# Patient Record
Sex: Female | Born: 1935
Health system: Southern US, Community
[De-identification: ages and names within clinical notes are randomized; demographics above are authoritative.]

## PROBLEM LIST (undated history)

## (undated) DIAGNOSIS — K635 Polyp of colon: Secondary | ICD-10-CM

## (undated) DIAGNOSIS — R001 Bradycardia, unspecified: Secondary | ICD-10-CM

## (undated) DIAGNOSIS — E119 Type 2 diabetes mellitus without complications: Secondary | ICD-10-CM

## (undated) DIAGNOSIS — J986 Disorders of diaphragm: Secondary | ICD-10-CM

## (undated) DIAGNOSIS — I10 Essential (primary) hypertension: Secondary | ICD-10-CM

## (undated) DIAGNOSIS — F0390 Unspecified dementia without behavioral disturbance: Secondary | ICD-10-CM

## (undated) DIAGNOSIS — E785 Hyperlipidemia, unspecified: Secondary | ICD-10-CM

## (undated) DIAGNOSIS — M47812 Spondylosis without myelopathy or radiculopathy, cervical region: Secondary | ICD-10-CM

## (undated) DIAGNOSIS — I779 Disorder of arteries and arterioles, unspecified: Secondary | ICD-10-CM

## (undated) DIAGNOSIS — I2510 Atherosclerotic heart disease of native coronary artery without angina pectoris: Secondary | ICD-10-CM

## (undated) DIAGNOSIS — I739 Peripheral vascular disease, unspecified: Secondary | ICD-10-CM

## (undated) HISTORY — DX: Polyp of colon: K63.5

## (undated) HISTORY — DX: Disorders of diaphragm: J98.6

## (undated) HISTORY — PX: CATARACT EXTRACTION: SUR2

## (undated) HISTORY — PX: CHOLECYSTECTOMY: SHX55

## (undated) HISTORY — PX: CORONARY ARTERY BYPASS GRAFT: SHX141

## (undated) HISTORY — PX: OTHER SURGICAL HISTORY: SHX169

## (undated) HISTORY — DX: Essential (primary) hypertension: I10

## (undated) HISTORY — PX: COLONOSCOPY W/ POLYPECTOMY: SHX1380

## (undated) HISTORY — PX: BREAST LUMPECTOMY: SHX2

## (undated) HISTORY — DX: Atherosclerotic heart disease of native coronary artery without angina pectoris: I25.10

## (undated) HISTORY — DX: Peripheral vascular disease, unspecified: I73.9

## (undated) HISTORY — DX: Disorder of arteries and arterioles, unspecified: I77.9

## (undated) HISTORY — PX: HIATAL HERNIA REPAIR: SHX195

## (undated) HISTORY — DX: Hyperlipidemia, unspecified: E78.5

## (undated) HISTORY — DX: Bradycardia, unspecified: R00.1

## (undated) HISTORY — PX: TOTAL ABDOMINAL HYSTERECTOMY: SHX209

## (undated) HISTORY — DX: Spondylosis without myelopathy or radiculopathy, cervical region: M47.812

## (undated) HISTORY — DX: Type 2 diabetes mellitus without complications: E11.9

---

## 1997-12-10 ENCOUNTER — Other Ambulatory Visit: Admission: RE | Admit: 1997-12-10 | Discharge: 1997-12-10 | Payer: Self-pay | Admitting: Obstetrics and Gynecology

## 1999-04-07 ENCOUNTER — Ambulatory Visit (HOSPITAL_COMMUNITY): Admission: RE | Admit: 1999-04-07 | Discharge: 1999-04-07 | Payer: Self-pay | Admitting: Gastroenterology

## 1999-04-07 ENCOUNTER — Encounter: Payer: Self-pay | Admitting: Gastroenterology

## 2001-12-18 ENCOUNTER — Encounter: Admission: RE | Admit: 2001-12-18 | Discharge: 2001-12-18 | Payer: Self-pay | Admitting: Family Medicine

## 2001-12-18 ENCOUNTER — Encounter: Payer: Self-pay | Admitting: Family Medicine

## 2002-04-17 ENCOUNTER — Encounter: Admission: RE | Admit: 2002-04-17 | Discharge: 2002-04-17 | Payer: Self-pay | Admitting: Family Medicine

## 2002-04-17 ENCOUNTER — Encounter: Payer: Self-pay | Admitting: Family Medicine

## 2002-04-24 ENCOUNTER — Encounter: Payer: Self-pay | Admitting: General Surgery

## 2002-04-29 ENCOUNTER — Observation Stay (HOSPITAL_COMMUNITY): Admission: RE | Admit: 2002-04-29 | Discharge: 2002-04-30 | Payer: Self-pay | Admitting: General Surgery

## 2002-04-29 ENCOUNTER — Encounter: Payer: Self-pay | Admitting: General Surgery

## 2002-04-29 ENCOUNTER — Encounter (INDEPENDENT_AMBULATORY_CARE_PROVIDER_SITE_OTHER): Payer: Self-pay | Admitting: Specialist

## 2002-09-05 ENCOUNTER — Encounter: Payer: Self-pay | Admitting: Thoracic Surgery (Cardiothoracic Vascular Surgery)

## 2002-09-05 ENCOUNTER — Encounter: Payer: Self-pay | Admitting: Emergency Medicine

## 2002-09-05 ENCOUNTER — Inpatient Hospital Stay (HOSPITAL_COMMUNITY): Admission: EM | Admit: 2002-09-05 | Discharge: 2002-09-09 | Payer: Self-pay | Admitting: Emergency Medicine

## 2002-09-06 ENCOUNTER — Encounter: Payer: Self-pay | Admitting: Thoracic Surgery (Cardiothoracic Vascular Surgery)

## 2002-09-07 ENCOUNTER — Encounter: Payer: Self-pay | Admitting: Thoracic Surgery (Cardiothoracic Vascular Surgery)

## 2002-09-08 ENCOUNTER — Encounter: Payer: Self-pay | Admitting: Thoracic Surgery (Cardiothoracic Vascular Surgery)

## 2002-09-29 ENCOUNTER — Encounter: Admission: RE | Admit: 2002-09-29 | Discharge: 2002-12-28 | Payer: Self-pay | Admitting: Cardiology

## 2002-11-24 ENCOUNTER — Encounter: Payer: Self-pay | Admitting: Thoracic Surgery (Cardiothoracic Vascular Surgery)

## 2002-11-24 ENCOUNTER — Encounter
Admission: RE | Admit: 2002-11-24 | Discharge: 2002-11-24 | Payer: Self-pay | Admitting: Thoracic Surgery (Cardiothoracic Vascular Surgery)

## 2002-12-29 ENCOUNTER — Encounter (HOSPITAL_COMMUNITY): Admission: RE | Admit: 2002-12-29 | Discharge: 2003-03-29 | Payer: Self-pay | Admitting: Cardiology

## 2004-07-25 ENCOUNTER — Ambulatory Visit: Payer: Self-pay | Admitting: Gastroenterology

## 2004-08-11 ENCOUNTER — Ambulatory Visit: Payer: Self-pay | Admitting: Gastroenterology

## 2004-08-15 ENCOUNTER — Ambulatory Visit: Payer: Self-pay | Admitting: Family Medicine

## 2004-08-24 ENCOUNTER — Ambulatory Visit: Payer: Self-pay | Admitting: Family Medicine

## 2004-09-19 ENCOUNTER — Ambulatory Visit: Payer: Self-pay | Admitting: Family Medicine

## 2004-10-11 ENCOUNTER — Ambulatory Visit: Payer: Self-pay | Admitting: Family Medicine

## 2004-12-12 ENCOUNTER — Ambulatory Visit: Payer: Self-pay | Admitting: Family Medicine

## 2004-12-13 ENCOUNTER — Encounter: Admission: RE | Admit: 2004-12-13 | Discharge: 2004-12-13 | Payer: Self-pay | Admitting: Family Medicine

## 2004-12-20 ENCOUNTER — Ambulatory Visit: Payer: Self-pay | Admitting: Family Medicine

## 2005-02-06 ENCOUNTER — Ambulatory Visit: Payer: Self-pay | Admitting: Cardiology

## 2005-03-27 ENCOUNTER — Encounter: Admission: RE | Admit: 2005-03-27 | Discharge: 2005-03-27 | Payer: Self-pay | Admitting: Family Medicine

## 2005-03-28 ENCOUNTER — Ambulatory Visit: Payer: Self-pay | Admitting: Family Medicine

## 2005-03-29 ENCOUNTER — Encounter: Admission: RE | Admit: 2005-03-29 | Discharge: 2005-03-29 | Payer: Self-pay | Admitting: Family Medicine

## 2005-04-06 ENCOUNTER — Ambulatory Visit: Payer: Self-pay | Admitting: Family Medicine

## 2005-04-13 ENCOUNTER — Ambulatory Visit: Payer: Self-pay | Admitting: Family Medicine

## 2005-09-23 ENCOUNTER — Emergency Department (HOSPITAL_COMMUNITY): Admission: EM | Admit: 2005-09-23 | Discharge: 2005-09-23 | Payer: Self-pay | Admitting: Emergency Medicine

## 2006-03-13 ENCOUNTER — Ambulatory Visit: Payer: Self-pay | Admitting: Cardiology

## 2006-04-16 ENCOUNTER — Ambulatory Visit: Payer: Self-pay | Admitting: Family Medicine

## 2006-04-23 ENCOUNTER — Encounter: Admission: RE | Admit: 2006-04-23 | Discharge: 2006-04-23 | Payer: Self-pay | Admitting: Family Medicine

## 2006-06-14 ENCOUNTER — Ambulatory Visit: Payer: Self-pay | Admitting: Family Medicine

## 2006-06-26 ENCOUNTER — Encounter: Admission: RE | Admit: 2006-06-26 | Discharge: 2006-06-26 | Payer: Self-pay | Admitting: Family Medicine

## 2006-08-20 ENCOUNTER — Ambulatory Visit: Payer: Self-pay | Admitting: Family Medicine

## 2006-10-02 ENCOUNTER — Ambulatory Visit: Payer: Self-pay | Admitting: Family Medicine

## 2006-10-10 ENCOUNTER — Ambulatory Visit: Payer: Self-pay | Admitting: Family Medicine

## 2006-10-10 LAB — CONVERTED CEMR LAB: Glucose, Bld: 77 mg/dL (ref 70–99)

## 2007-02-25 ENCOUNTER — Encounter: Payer: Self-pay | Admitting: Family Medicine

## 2007-03-14 DIAGNOSIS — J309 Allergic rhinitis, unspecified: Secondary | ICD-10-CM | POA: Insufficient documentation

## 2007-03-14 DIAGNOSIS — M810 Age-related osteoporosis without current pathological fracture: Secondary | ICD-10-CM

## 2007-03-18 ENCOUNTER — Ambulatory Visit: Payer: Self-pay | Admitting: Cardiology

## 2007-04-18 ENCOUNTER — Ambulatory Visit: Payer: Self-pay | Admitting: Family Medicine

## 2007-04-18 DIAGNOSIS — I119 Hypertensive heart disease without heart failure: Secondary | ICD-10-CM | POA: Insufficient documentation

## 2007-04-18 LAB — CONVERTED CEMR LAB
ALT: 16 units/L (ref 0–35)
AST: 22 units/L (ref 0–37)
Alkaline Phosphatase: 91 units/L (ref 39–117)
Basophils Absolute: 0 10*3/uL (ref 0.0–0.1)
Creatinine, Ser: 0.9 mg/dL (ref 0.4–1.2)
Eosinophils Absolute: 0.3 10*3/uL (ref 0.0–0.6)
GFR calc Af Amer: 78 mL/min
Glucose, Bld: 103 mg/dL — ABNORMAL HIGH (ref 70–99)
HCT: 38.4 % (ref 36.0–46.0)
Hemoglobin: 13.3 g/dL (ref 12.0–15.0)
MCHC: 34.6 g/dL (ref 30.0–36.0)
Monocytes Relative: 7.1 % (ref 3.0–11.0)
Platelets: 183 10*3/uL (ref 150–400)
Potassium: 3.9 meq/L (ref 3.5–5.1)
RBC: 4.35 M/uL (ref 3.87–5.11)
Total Bilirubin: 0.7 mg/dL (ref 0.3–1.2)
Total CHOL/HDL Ratio: 3.6
Total Protein: 6.3 g/dL (ref 6.0–8.3)
Triglycerides: 204 mg/dL (ref 0–149)
VLDL: 41 mg/dL — ABNORMAL HIGH (ref 0–40)
WBC: 5.6 10*3/uL (ref 4.5–10.5)

## 2007-05-03 ENCOUNTER — Telehealth: Payer: Self-pay | Admitting: Family Medicine

## 2007-06-05 ENCOUNTER — Ambulatory Visit: Payer: Self-pay | Admitting: Family Medicine

## 2007-06-06 ENCOUNTER — Telehealth: Payer: Self-pay | Admitting: *Deleted

## 2007-07-12 ENCOUNTER — Telehealth: Payer: Self-pay | Admitting: Family Medicine

## 2007-07-22 ENCOUNTER — Encounter: Payer: Self-pay | Admitting: Family Medicine

## 2007-08-02 ENCOUNTER — Telehealth (INDEPENDENT_AMBULATORY_CARE_PROVIDER_SITE_OTHER): Payer: Self-pay | Admitting: *Deleted

## 2007-08-06 ENCOUNTER — Telehealth: Payer: Self-pay | Admitting: Family Medicine

## 2007-08-15 ENCOUNTER — Telehealth: Payer: Self-pay | Admitting: Family Medicine

## 2007-10-17 ENCOUNTER — Ambulatory Visit: Payer: Self-pay | Admitting: Family Medicine

## 2007-10-29 ENCOUNTER — Ambulatory Visit: Payer: Self-pay | Admitting: Family Medicine

## 2007-11-06 ENCOUNTER — Ambulatory Visit: Payer: Self-pay | Admitting: Cardiology

## 2007-11-06 ENCOUNTER — Inpatient Hospital Stay (HOSPITAL_COMMUNITY): Admission: EM | Admit: 2007-11-06 | Discharge: 2007-11-08 | Payer: Self-pay | Admitting: Emergency Medicine

## 2007-11-18 ENCOUNTER — Ambulatory Visit: Payer: Self-pay | Admitting: Cardiology

## 2007-11-21 ENCOUNTER — Ambulatory Visit: Payer: Self-pay | Admitting: Family Medicine

## 2007-12-20 ENCOUNTER — Telehealth: Payer: Self-pay | Admitting: *Deleted

## 2007-12-21 ENCOUNTER — Ambulatory Visit: Payer: Self-pay | Admitting: Internal Medicine

## 2007-12-21 ENCOUNTER — Telehealth: Payer: Self-pay | Admitting: Internal Medicine

## 2007-12-23 ENCOUNTER — Ambulatory Visit: Payer: Self-pay | Admitting: Family Medicine

## 2008-01-14 ENCOUNTER — Ambulatory Visit: Payer: Self-pay | Admitting: Family Medicine

## 2008-01-14 LAB — CONVERTED CEMR LAB
AST: 19 units/L (ref 0–37)
Alkaline Phosphatase: 75 units/L (ref 39–117)
Bilirubin, Direct: 0.2 mg/dL (ref 0.0–0.3)
HDL: 39.2 mg/dL (ref >39.0)
LDL Cholesterol: 68 mg/dL (ref 0–99)
Total CHOL/HDL Ratio: 3.3
Triglycerides: 117 mg/dL (ref 0–149)

## 2008-04-29 ENCOUNTER — Ambulatory Visit: Payer: Self-pay | Admitting: Family Medicine

## 2008-04-29 LAB — CONVERTED CEMR LAB
AST: 23 units/L (ref 0–37)
Albumin: 3.7 g/dL (ref 3.5–5.2)
BUN: 14 mg/dL (ref 6–23)
Basophils Absolute: 0 10*3/uL (ref 0.0–0.1)
Bilirubin, Direct: 0.2 mg/dL (ref 0.0–0.3)
Calcium: 9.2 mg/dL (ref 8.4–10.5)
Creatinine, Ser: 1 mg/dL (ref 0.4–1.2)
GFR calc Af Amer: 69 mL/min
GFR calc non Af Amer: 57 mL/min
Glucose, Bld: 94 mg/dL (ref 70–99)
Glucose, Urine, Semiquant: NEGATIVE
HCT: 38.4 % (ref 36.0–46.0)
Hemoglobin: 13 g/dL (ref 12.0–15.0)
Ketones, urine, test strip: NEGATIVE
LDL Cholesterol: 82 mg/dL (ref 0–99)
MCHC: 33.9 g/dL (ref 30.0–36.0)
Neutro Abs: 2.6 10*3/uL (ref 1.4–7.7)
Nitrite: NEGATIVE
Platelets: 179 10*3/uL (ref 150–400)
Protein, U semiquant: NEGATIVE
RDW: 13.8 % (ref 11.5–14.6)
Sodium: 145 meq/L (ref 135–145)
Total CHOL/HDL Ratio: 2.8
Total Protein: 6.7 g/dL (ref 6.0–8.3)
VLDL: 18 mg/dL (ref 0–40)
WBC: 4 10*3/uL — ABNORMAL LOW (ref 4.5–10.5)

## 2008-05-19 ENCOUNTER — Ambulatory Visit: Payer: Self-pay | Admitting: Cardiology

## 2008-06-15 ENCOUNTER — Telehealth: Payer: Self-pay | Admitting: Family Medicine

## 2008-07-14 ENCOUNTER — Ambulatory Visit: Payer: Self-pay | Admitting: Family Medicine

## 2008-07-14 ENCOUNTER — Telehealth: Payer: Self-pay | Admitting: *Deleted

## 2008-07-22 ENCOUNTER — Encounter: Admission: RE | Admit: 2008-07-22 | Discharge: 2008-07-22 | Payer: Self-pay | Admitting: Family Medicine

## 2008-10-05 ENCOUNTER — Ambulatory Visit: Payer: Self-pay | Admitting: Family Medicine

## 2008-10-05 DIAGNOSIS — M479 Spondylosis, unspecified: Secondary | ICD-10-CM | POA: Insufficient documentation

## 2008-10-05 DIAGNOSIS — L723 Sebaceous cyst: Secondary | ICD-10-CM | POA: Insufficient documentation

## 2008-11-13 DIAGNOSIS — D126 Benign neoplasm of colon, unspecified: Secondary | ICD-10-CM

## 2008-11-17 ENCOUNTER — Encounter: Payer: Self-pay | Admitting: Cardiology

## 2008-11-17 ENCOUNTER — Ambulatory Visit: Payer: Self-pay | Admitting: Cardiology

## 2008-12-22 ENCOUNTER — Telehealth: Payer: Self-pay | Admitting: Cardiology

## 2009-05-03 ENCOUNTER — Ambulatory Visit: Payer: Self-pay | Admitting: Family Medicine

## 2009-05-03 LAB — CONVERTED CEMR LAB
ALT: 14 units/L (ref 0–35)
Alkaline Phosphatase: 76 units/L (ref 39–117)
Basophils Absolute: 0 10*3/uL (ref 0.0–0.1)
Calcium: 9.4 mg/dL (ref 8.4–10.5)
Creatinine, Ser: 0.9 mg/dL (ref 0.4–1.2)
Eosinophils Relative: 4.7 % (ref 0.0–5.0)
GFR calc non Af Amer: 64.34 mL/min (ref >60)
Glucose, Bld: 86 mg/dL (ref 70–99)
Hemoglobin: 13.5 g/dL (ref 12.0–15.0)
Lymphocytes Relative: 20.2 % (ref 12.0–46.0)
Lymphs Abs: 1 10*3/uL (ref 0.7–4.0)
MCV: 90.8 fL (ref 78.0–100.0)
Monocytes Relative: 6.5 % (ref 3.0–12.0)
Neutrophils Relative %: 68 % (ref 43.0–77.0)
Total Bilirubin: 0.8 mg/dL (ref 0.3–1.2)
Total CHOL/HDL Ratio: 2
WBC: 4.9 10*3/uL (ref 4.5–10.5)

## 2009-05-13 ENCOUNTER — Encounter: Payer: Self-pay | Admitting: Cardiology

## 2009-05-17 ENCOUNTER — Ambulatory Visit: Payer: Self-pay | Admitting: Family Medicine

## 2009-05-17 ENCOUNTER — Ambulatory Visit: Payer: Self-pay | Admitting: Cardiology

## 2009-06-21 ENCOUNTER — Telehealth: Payer: Self-pay | Admitting: Family Medicine

## 2009-07-23 ENCOUNTER — Encounter: Admission: RE | Admit: 2009-07-23 | Discharge: 2009-07-23 | Payer: Self-pay | Admitting: Family Medicine

## 2009-07-29 ENCOUNTER — Encounter: Payer: Self-pay | Admitting: Family Medicine

## 2009-08-02 ENCOUNTER — Encounter: Admission: RE | Admit: 2009-08-02 | Discharge: 2009-08-02 | Payer: Self-pay | Admitting: Family Medicine

## 2009-08-02 ENCOUNTER — Telehealth: Payer: Self-pay | Admitting: Internal Medicine

## 2009-08-03 ENCOUNTER — Telehealth: Payer: Self-pay | Admitting: Family Medicine

## 2009-09-27 ENCOUNTER — Ambulatory Visit: Payer: Self-pay | Admitting: Family Medicine

## 2009-10-28 ENCOUNTER — Telehealth: Payer: Self-pay | Admitting: Family Medicine

## 2009-11-11 ENCOUNTER — Encounter: Payer: Self-pay | Admitting: Cardiology

## 2009-11-12 ENCOUNTER — Ambulatory Visit: Payer: Self-pay | Admitting: Cardiology

## 2010-05-10 ENCOUNTER — Encounter: Payer: Self-pay | Admitting: Cardiology

## 2010-05-12 ENCOUNTER — Ambulatory Visit: Payer: Self-pay | Admitting: Cardiology

## 2010-06-13 ENCOUNTER — Encounter: Payer: Self-pay | Admitting: Cardiology

## 2010-06-13 ENCOUNTER — Ambulatory Visit: Payer: Self-pay

## 2010-06-13 ENCOUNTER — Ambulatory Visit: Payer: Self-pay | Admitting: Family Medicine

## 2010-06-13 DIAGNOSIS — F329 Major depressive disorder, single episode, unspecified: Secondary | ICD-10-CM

## 2010-07-11 ENCOUNTER — Ambulatory Visit: Payer: Self-pay | Admitting: Family Medicine

## 2010-08-10 ENCOUNTER — Telehealth (INDEPENDENT_AMBULATORY_CARE_PROVIDER_SITE_OTHER): Payer: Self-pay | Admitting: *Deleted

## 2010-08-10 ENCOUNTER — Ambulatory Visit
Admission: RE | Admit: 2010-08-10 | Discharge: 2010-08-10 | Payer: Self-pay | Source: Home / Self Care | Attending: Family Medicine | Admitting: Family Medicine

## 2010-08-10 ENCOUNTER — Other Ambulatory Visit: Payer: Self-pay | Admitting: Family Medicine

## 2010-08-10 ENCOUNTER — Encounter: Payer: Self-pay | Admitting: Family Medicine

## 2010-08-10 ENCOUNTER — Telehealth: Payer: Self-pay | Admitting: Family Medicine

## 2010-08-10 DIAGNOSIS — R269 Unspecified abnormalities of gait and mobility: Secondary | ICD-10-CM | POA: Insufficient documentation

## 2010-08-10 DIAGNOSIS — R1310 Dysphagia, unspecified: Secondary | ICD-10-CM | POA: Insufficient documentation

## 2010-08-10 DIAGNOSIS — D485 Neoplasm of uncertain behavior of skin: Secondary | ICD-10-CM | POA: Insufficient documentation

## 2010-08-10 LAB — LIPID PANEL
Cholesterol: 140 mg/dL (ref 0–200)
HDL: 55.7 mg/dL (ref >39.00)
LDL Cholesterol: 69 mg/dL (ref 0–99)
Total CHOL/HDL Ratio: 3
Triglycerides: 76 mg/dL (ref 0.0–149.0)
VLDL: 15.2 mg/dL (ref 0.0–40.0)

## 2010-08-10 LAB — CBC WITH DIFFERENTIAL/PLATELET
Basophils Absolute: 0 10*3/uL (ref 0.0–0.1)
Basophils Relative: 1 % (ref 0.0–3.0)
Eosinophils Absolute: 0.2 10*3/uL (ref 0.0–0.7)
Eosinophils Relative: 6.9 % — ABNORMAL HIGH (ref 0.0–5.0)
HCT: 38.3 % (ref 36.0–46.0)
Hemoglobin: 12.8 g/dL (ref 12.0–15.0)
Lymphocytes Relative: 26.9 % (ref 12.0–46.0)
Lymphs Abs: 1 10*3/uL (ref 0.7–4.0)
MCHC: 33.3 g/dL (ref 30.0–36.0)
MCV: 91.8 fl (ref 78.0–100.0)
Monocytes Absolute: 0.3 10*3/uL (ref 0.1–1.0)
Monocytes Relative: 7.9 % (ref 3.0–12.0)
Neutro Abs: 2.1 10*3/uL (ref 1.4–7.7)
Neutrophils Relative %: 57.3 % (ref 43.0–77.0)
Platelets: 166 10*3/uL (ref 150.0–400.0)
RBC: 4.18 Mil/uL (ref 3.87–5.11)
RDW: 13 % (ref 11.5–14.6)
WBC: 3.6 10*3/uL — ABNORMAL LOW (ref 4.5–10.5)

## 2010-08-10 LAB — HEPATIC FUNCTION PANEL
ALT: 15 U/L (ref 0–35)
AST: 22 U/L (ref 0–37)
Albumin: 3.7 g/dL (ref 3.5–5.2)
Alkaline Phosphatase: 60 U/L (ref 39–117)
Bilirubin, Direct: 0.1 mg/dL (ref 0.0–0.3)
Total Bilirubin: 1 mg/dL (ref 0.3–1.2)
Total Protein: 6.4 g/dL (ref 6.0–8.3)

## 2010-08-10 LAB — BASIC METABOLIC PANEL
BUN: 11 mg/dL (ref 6–23)
CO2: 32 mEq/L (ref 19–32)
Calcium: 9.3 mg/dL (ref 8.4–10.5)
Chloride: 103 mEq/L (ref 96–112)
Creatinine, Ser: 0.9 mg/dL (ref 0.4–1.2)
GFR: 66.68 mL/min (ref >60.00)
Glucose, Bld: 82 mg/dL (ref 70–99)
Potassium: 3.6 mEq/L (ref 3.5–5.1)
Sodium: 141 mEq/L (ref 135–145)

## 2010-08-10 LAB — TSH: TSH: 1.72 u[IU]/mL (ref 0.35–5.50)

## 2010-08-11 ENCOUNTER — Encounter
Admission: RE | Admit: 2010-08-11 | Discharge: 2010-08-11 | Payer: Self-pay | Source: Home / Self Care | Attending: Family Medicine | Admitting: Family Medicine

## 2010-08-11 LAB — CONVERTED CEMR LAB: Vit D, 25-Hydroxy: 33 ng/mL (ref 30–89)

## 2010-08-12 ENCOUNTER — Telehealth: Payer: Self-pay | Admitting: Family Medicine

## 2010-08-17 ENCOUNTER — Ambulatory Visit
Admission: RE | Admit: 2010-08-17 | Discharge: 2010-08-17 | Payer: Self-pay | Source: Home / Self Care | Attending: Family Medicine | Admitting: Family Medicine

## 2010-08-17 DIAGNOSIS — K1370 Unspecified lesions of oral mucosa: Secondary | ICD-10-CM | POA: Insufficient documentation

## 2010-08-23 ENCOUNTER — Ambulatory Visit
Admission: RE | Admit: 2010-08-23 | Discharge: 2010-08-23 | Payer: Self-pay | Source: Home / Self Care | Attending: Cardiology | Admitting: Cardiology

## 2010-08-24 ENCOUNTER — Telehealth (INDEPENDENT_AMBULATORY_CARE_PROVIDER_SITE_OTHER): Payer: Self-pay | Admitting: *Deleted

## 2010-08-28 ENCOUNTER — Encounter: Payer: Self-pay | Admitting: Family Medicine

## 2010-09-08 NOTE — Assessment & Plan Note (Signed)
Summary: cpx /lab/cbs   Vital Signs:  Patient profile:   77 year old female Menstrual status:  hysterectomy Height:      62 inches Weight:      132 pounds BMI:     24.23 Pulse rate:   63 / minute BP sitting:   126 / 72  (left arm)  Vitals Entered By: Doristine Devoid CMA (August 10, 2010 8:36 AM) CC: YEARLY AND LABS   History of Present Illness: 77 yo woman here today for CPE.    Here for Medicare AWV:  1.   Risk factors based on Past M, S, F history: hyperlipidemia- on lipitor.  due for labs.  denies abd pain, N/V, myalgias depression- on citalopram.  sleeping more since starting the medication.  feeling sluggish. dysphagia- intermittant, sxs started 2-3 weeks ago.  feels that she has decreased saliva making it hard to swallow.  not a mechanical problem per pt.  having increased PND recently. balance problems- most noticeable when attempting to use an escalator.  pt is now avoiding them.  feels that she can't get her feet on the steps.  also notices that when she walks she will drift to the R- never left. 2.   Physical Activities: limited activity but will do her shopping and social activities 3.   Depression/mood: see above. 4.   Hearing: some difficulty w/ whispered voice at 6 ft but normal w/ conversational tones 5.   ADL's: independent 6.   Fall Risk: starting to worry more about falling given that she is veering R 7.   Home Safety: feels safe at home, has close neighbors and lives w/ daughter 31.   Height, weight, &visual acuity: see vitals, vision corrected to 20/20 w/ glasses 9.   Counseling: provided on healthy diet, regular exercise, mood 10.   Labs ordered based on risk factors- see A&P 11.           Referral Coordination- see A&P.  has mammogram appt, overdue colonoscopy, no need for pap (s/p TAH) 12.           Care Plan- see A&P 13.           Cognitive Assessment- some mild forgetfulness, normal linear thought process  Preventive Screening-Counseling &  Management  Alcohol-Tobacco     Alcohol drinks/day: 0     Smoking Status: never  Caffeine-Diet-Exercise     Does Patient Exercise: no  Current Medications (verified): 1)  Tenoretic 50 50-25 Mg  Tabs (Atenolol-Chlorthalidone) .... 1/2 Tab Once Daily 2)  Nitroquick 0.4 Mg  Subl (Nitroglycerin) .... As Needed 3)  Lipitor 40 Mg Tabs (Atorvastatin Calcium) .... Take One-Half Tablet By Mouth Daily. 4)  Adult Aspirin Low Strength 81 Mg  Tbdp (Aspirin) .Marland Kitchen.. 1 By Mouth Once Daily 5)  Tylenol 325 Mg Tabs (Acetaminophen) 6)  Calcium-Vitamin D 500-200 Mg-Unit Tabs (Calcium Carbonate-Vitamin D) .... Take One Tablet By Mouth Once Daily. 7)  Stool Softener .... As Needed 8)  Ra Ibuprofen 200 Mg Tabs (Ibuprofen) .... As Needed 9)  Flonase 50 Mcg/act Susp (Fluticasone Propionate) .... Uad 10)  Histex 2-30 Mg/74ml Liqd (Chlorpheniramine-Pseudoeph) .... Uad 11)  Zyrtec Allergy 10 Mg Tabs (Cetirizine Hcl) .... As Needed 12)  Citalopram Hydrobromide 20 Mg Tabs (Citalopram Hydrobromide) .... Take One Tablet By Mouth Daily  Allergies (verified): 1)  ! * Tetanus 2)  ! * Horse Serum 3)  ! * Ivp Dye 4)  ! Codeine  Past History:  Past medical, surgical, family and social  histories (including risk factors) reviewed, and no changes noted (except as noted below).  Past Medical History: Reviewed history from 05/12/2010 and no changes required. CORONARY ARTERY DISEASE (ICD-414.00)...anomalous left circumflex from the right coronary cusp  /  cath 2009.Marland Kitchengrafts patent..normal LV CABG 2004.. EF  normal by cath 2009  (no echo data as of 11/11/2009) HYPERLIPIDEMIA, MIXED (ICD-272.2) DISEASE, HYPERTENSIVE HEART NOS, W/O HF (ICD-402.90) CHEST PAIN, ATYPICAL (ICD-786.59) COLONIC POLYPS (ICD-211.3) OSTEOARTHRITIS, CERVICAL SPINE (ICD-721.90) SEBACEOUS CYST, SCALP (ICD-706.2) HEARING LOSS, CONDUCTIVE, BILATERAL (ICD-389.00) OSTEOPOROSIS (ICD-733.00) ALLERGIC RHINITIS (ICD-477.9) Elevated hemidiaphragm Carotid  bruit    October, 2011    Past Surgical History: Reviewed history from 11/13/2008 and no changes required. TAH H/H repair ACS/CABG GB Cholecystectomy.  Colonoscopy.   Multiple breast lumpectomies, all benign.   Family History: Reviewed history from 06/13/2010 and no changes required. father died 32, MI and stroke mother late 45s pacemaker dementia  breast cancer one brother one sister both in good health  Social History: Reviewed history from 06/13/2010 and no changes required. Retired widowed 2000- husband w/ heart problems and COPD Never Smoked Alcohol use-no Drug use-no Regular exercise-yesDoes Patient Exercise:  no  Review of Systems  The patient denies anorexia, fever, weight loss, weight gain, vision loss, decreased hearing, hoarseness, chest pain, syncope, dyspnea on exertion, peripheral edema, prolonged cough, headaches, abdominal pain, melena, hematochezia, severe indigestion/heartburn, hematuria, suspicious skin lesions, depression, abnormal bleeding, enlarged lymph nodes, and breast masses.    Physical Exam  General:  Well-developed,well-nourished,in no acute distress; alert,appropriate and cooperative throughout examination Head:  Normocephalic and atraumatic without obvious abnormalities. No apparent alopecia or balding. Eyes:  No corneal or conjunctival inflammation noted. EOMI. Perrla. Funduscopic exam benign, without hemorrhages, exudates or papilledema. Vision grossly normal. Ears:  External ear exam shows no significant lesions or deformities.  Otoscopic examination reveals clear canals, tympanic membranes are intact bilaterally without bulging, retraction, inflammation or discharge. Hearing is grossly normal bilaterally. Nose:  edematous turbinates Mouth:  +PND, otherwise normal Neck:  No deformities, masses, or tenderness noted. Breasts:  No mass, nodules, thickening, tenderness, bulging, retraction, inflamation, nipple discharge or skin changes noted.    Lungs:  Normal respiratory effort, chest expands symmetrically. Lungs are clear to auscultation, no crackles or wheezes. Heart:  Normal rate and regular rhythm. S1 and S2 normal without gallop, murmur, click, rub or other extra sounds. Abdomen:  Bowel sounds positive,abdomen soft and non-tender without masses, organomegaly or hernias noted. Genitalia:  deferred- done in fall of 2010 Pulses:  +2 carotid, radial, DP Extremities:  No clubbing, cyanosis, edema, or deformity noted with normal full range of motion of all joints.   Neurologic:  No cranial nerve deficits noted. Station and gait are normal. Plantar reflexes are down-going bilaterally. DTRs are symmetrical throughout. Sensory, motor and coordinative functions appear intact. Skin:  multiple areas that could represent non-melanoma cancers Cervical Nodes:  No lymphadenopathy noted Axillary Nodes:  No palpable lymphadenopathy Psych:  Cognition and judgment appear intact. Alert and cooperative with normal attention span and concentration. No apparent delusions, illusions, hallucinations.   Impression & Recommendations:  Problem # 1:  PHYSICAL EXAMINATION (ICD-V70.0) Assessment New  pt's PE WNL.  UTD on GYN screening, will refer for colonoscopy.  check labs.  anticipatory guidance provided.  Orders: Medicare -1st Annual Wellness Visit [id number])  Problem # 2:  HYPERLIPIDEMIA, MIXED (ICD-272.2) Assessment: Unchanged check labs.  adjust meds as needed. Her updated medication list for this problem includes:    Lipitor 20 Mg Tabs (Atorvastatin calcium) .Marland KitchenMarland KitchenMarland KitchenMarland Kitchen  Take as directed  Orders: Venipuncture (16109) Specimen Handling (60454) TLB-Lipid Panel (80061-LIPID) TLB-Hepatic/Liver Function Pnl (80076-HEPATIC)  Problem # 3:  OSTEOPOROSIS (ICD-733.00) Assessment: Unchanged check Vit D level Her updated medication list for this problem includes:    Calcium-vitamin D 500-200 Mg-unit Tabs (Calcium carbonate-vitamin d) .Marland Kitchen... Take one tablet  by mouth once daily.  Orders: T-Vitamin D (25-Hydroxy) [phone])  Problem # 4:  DEPRESSIVE DISORDER (ICD-311) Assessment: Unchanged pt reports mood has improved but she feels sluggish on meds.  decrease dose to 1/2 tab daily. Her updated medication list for this problem includes:    Citalopram Hydrobromide 20 Mg Tabs (Citalopram hydrobromide) .Marland Kitchen... Take 1/2 tablet by mouth daily  Orders: Specimen Handling (29562) TLB-CBC Platelet - w/Differential (85025-CBCD) TLB-TSH (Thyroid Stimulating Hormone) (84443-TSH)  Problem # 5:  DYSPHAGIA (ICD-787.20) Assessment: New pt denies mechanical sxs and reports it feels more like something is always 'stuck' in her throat.  most likely due to PND.  continue nasal spray to decrease congestion and PND.  if no improvement in sxs will refer to GI for evaluation.  Problem # 6:  GAIT IMBALANCE (ICD-781.2) Assessment: New neuro exam WNL today in office but pt concerned about 'veering R'.  refer to neuro for complete evaluation. Orders: Neurology Referral (Neuro)  Complete Medication List: 1)  Tenoretic 50 50-25 Mg Tabs (Atenolol-chlorthalidone) .... Take as directed 2)  Nitroquick 0.4 Mg Subl (Nitroglycerin) .... As needed 3)  Lipitor 20 Mg Tabs (Atorvastatin calcium) .... Take as directed 4)  Adult Aspirin Low Strength 81 Mg Tbdp (Aspirin) .Marland Kitchen.. 1 by mouth once daily 5)  Tylenol 325 Mg Tabs (Acetaminophen) 6)  Calcium-vitamin D 500-200 Mg-unit Tabs (Calcium carbonate-vitamin d) .... Take one tablet by mouth once daily. 7)  Stool Softener  .... As needed 8)  Ra Ibuprofen 200 Mg Tabs (Ibuprofen) .... As needed 9)  Flonase 50 Mcg/act Susp (Fluticasone propionate) .... Uad 10)  Histex 2-30 Mg/32ml Liqd (Chlorpheniramine-pseudoeph) .... Uad 11)  Zyrtec Allergy 10 Mg Tabs (Cetirizine hcl) .... As needed 12)  Citalopram Hydrobromide 20 Mg Tabs (Citalopram hydrobromide) .... Take 1/2 tablet by mouth daily  Other Orders: TLB-BMP (Basic Metabolic  Panel-BMET) (80048-METABOL) Dermatology Referral [facility]) Gastroenterology Referral (GI)  Patient Instructions: 1)  Please follow up in 6 months to recheck your blood pressure and cholesterol 2)  We'll notify you of your lab results 3)  Someone will call you with your Dermatology, GI, and Neuro appts 4)  Try and get back into regular exercise 5)  Decrease your Citalopram to 1/2 tab nightly- when you pick up your new prescription it will be 1 tab nightly 6)  Continue your nasal spray but use it in the morning 7)  Drink more water to prevent dry lips and mouth 8)  Call with any questions or concerns 9)  Happy New Year!   Orders Added: 1)  Venipuncture [36415] 2)  T-Vitamin D (25-Hydroxy) [phone] 3)  Specimen Handling [99000] 4)  TLB-Lipid Panel [80061-LIPID] 5)  TLB-Hepatic/Liver Function Pnl [80076-HEPATIC] 6)  TLB-BMP (Basic Metabolic Panel-BMET) [80048-METABOL] 7)  TLB-CBC Platelet - w/Differential [85025-CBCD] 8)  TLB-TSH (Thyroid Stimulating Hormone) [84443-TSH] 9)  Neurology Referral [Neuro] 10)  Dermatology Referral [Derma] 11)  Gastroenterology Referral [GI] 12)  Medicare -1st Annual Wellness Visit [G0438] 13)  Est. Patient Level III [96295]

## 2010-09-08 NOTE — Assessment & Plan Note (Signed)
Summary: f100m   Visit Type:  Follow-up Primary Provider:  Roderick Pee MD  CC:  CAD.  History of Present Illness: The patient is seen for followup of coronary artery disease.  She is stable.  She's not having any significant symptoms.  She has some mild vertigo.  Current Medications (verified): 1)  Tenoretic 50 50-25 Mg  Tabs (Atenolol-Chlorthalidone) .... 1/2 Tab Once Daily 2)  Nitroquick 0.4 Mg  Subl (Nitroglycerin) .... As Needed 3)  Lipitor 40 Mg Tabs (Atorvastatin Calcium) .... Take One-Half Tablet By Mouth Daily. 4)  Adult Aspirin Low Strength 81 Mg  Tbdp (Aspirin) .Marland Kitchen.. 1 By Mouth Once Daily 5)  Tylenol 325 Mg Tabs (Acetaminophen) 6)  Calcium-Vitamin D 500-200 Mg-Unit Tabs (Calcium Carbonate-Vitamin D) .... Take One Tablet By Mouth Once Daily. 7)  Stool Softener .... As Needed 8)  Ra Ibuprofen 200 Mg Tabs (Ibuprofen) .... As Needed 9)  Flonase 50 Mcg/act Susp (Fluticasone Propionate) .... Uad 10)  Histex 2-30 Mg/66ml Liqd (Chlorpheniramine-Pseudoeph) .... Uad 11)  Zantac 150 Mg Tabs (Ranitidine Hcl) .... As Needed  Allergies (verified): 1)  ! * Tetanus 2)  ! * Horse Serum 3)  ! * Ivp Dye 4)  ! Codeine  Past History:  Past Medical History: Last updated: 11/11/2009 CORONARY ARTERY DISEASE (ICD-414.00)...anomalous left circumflex from the right coronary cusp  /  cath 2009.Marland Kitchengrafts patent CABG 2004.. LV  normal by cath 2009  (no echo data as of 11/11/2009) HYPERLIPIDEMIA, MIXED (ICD-272.2) DISEASE, HYPERTENSIVE HEART NOS, W/O HF (ICD-402.90) CHEST PAIN, ATYPICAL (ICD-786.59) COLONIC POLYPS (ICD-211.3) OSTEOARTHRITIS, CERVICAL SPINE (ICD-721.90) SEBACEOUS CYST, SCALP (ICD-706.2) HEARING LOSS, CONDUCTIVE, BILATERAL (ICD-389.00) OSTEOPOROSIS (ICD-733.00) ALLERGIC RHINITIS (ICD-477.9) Elevated hemidiaphragm    Review of Systems       Patient denies fever, chills, headache, sweats, rash, change in vision, change in hearing, chest pain, cough, nausea or vomiting, urinary  symptoms.  Patient does have mild vertigo.  All other systems are reviewed and are negative.  Vital Signs:  Patient profile:   77 year old female Menstrual status:  hysterectomy Height:      62 inches Weight:      132 pounds BMI:     24.23 Pulse rate:   68 / minute BP sitting:   130 / 74  (left arm) Cuff size:   regular  Vitals Entered By: Hardin Negus, RMA (November 12, 2009 11:18 AM)  Physical Exam  General:  patient is stable. Eyes:  no xanthelasma. Neck:  no jugular distention. Lungs:  lungs are clear respiratory effort is nonlabored. Heart:  cardiac exam reveals an S1-S2.  No clicks or significant murmurs. Abdomen:  abdomen is soft. Extremities:  no peripheral edema. Psych:  patient is oriented to person time and place.  Affect is normal.   Impression & Recommendations:  Problem # 1:  CORONARY ARTERY DISEASE (ICD-414.00)  Her updated medication list for this problem includes:    Tenoretic 50 50-25 Mg Tabs (Atenolol-chlorthalidone) .Marland Kitchen... 1/2 tab once daily    Nitroquick 0.4 Mg Subl (Nitroglycerin) .Marland Kitchen... As needed    Adult Aspirin Low Strength 81 Mg Tbdp (Aspirin) .Marland Kitchen... 1 by mouth once daily Coronary disease is stable.  No further workup is needed.  Problem # 2:  DISEASE, HYPERTENSIVE HEART NOS, W/O HF (ICD-402.90)  Her updated medication list for this problem includes:    Tenoretic 50 50-25 Mg Tabs (Atenolol-chlorthalidone) .Marland Kitchen... 1/2 tab once daily    Adult Aspirin Low Strength 81 Mg Tbdp (Aspirin) .Marland Kitchen... 1 by mouth once  daily Blood pressure stable.  No change in medicine.  Problem # 3:  HYPERLIPIDEMIA, MIXED (ICD-272.2)  Her updated medication list for this problem includes:    Lipitor 40 Mg Tabs (Atorvastatin calcium) .Marland Kitchen... Take one-half tablet by mouth daily. Patient is being treated for her lipids.  Patient Instructions: 1)  Your physician recommends that you schedule a follow-up appointment in: 6 months

## 2010-09-08 NOTE — Miscellaneous (Signed)
  Clinical Lists Changes  Observations: Added new observation of PAST MED HX: CORONARY ARTERY DISEASE (ICD-414.00)...anomalous left circumflex from the right coronary cusp  /  cath 2009.Marland Kitchengrafts patent CABG 2004.. LV  normal by cath 2009  (no echo data as of 11/11/2009) HYPERLIPIDEMIA, MIXED (ICD-272.2) DISEASE, HYPERTENSIVE HEART NOS, W/O HF (ICD-402.90) CHEST PAIN, ATYPICAL (ICD-786.59) COLONIC POLYPS (ICD-211.3) OSTEOARTHRITIS, CERVICAL SPINE (ICD-721.90) SEBACEOUS CYST, SCALP (ICD-706.2) HEARING LOSS, CONDUCTIVE, BILATERAL (ICD-389.00) OSTEOPOROSIS (ICD-733.00) ALLERGIC RHINITIS (ICD-477.9) Elevated hemidiaphragm    (11/11/2009 8:05) Added new observation of PRIMARY MD: Roderick Pee MD (11/11/2009 8:05)       Past History:  Past Medical History: CORONARY ARTERY DISEASE (ICD-414.00)...anomalous left circumflex from the right coronary cusp  /  cath 2009.Marland Kitchengrafts patent CABG 2004.. LV  normal by cath 2009  (no echo data as of 11/11/2009) HYPERLIPIDEMIA, MIXED (ICD-272.2) DISEASE, HYPERTENSIVE HEART NOS, W/O HF (ICD-402.90) CHEST PAIN, ATYPICAL (ICD-786.59) COLONIC POLYPS (ICD-211.3) OSTEOARTHRITIS, CERVICAL SPINE (ICD-721.90) SEBACEOUS CYST, SCALP (ICD-706.2) HEARING LOSS, CONDUCTIVE, BILATERAL (ICD-389.00) OSTEOPOROSIS (ICD-733.00) ALLERGIC RHINITIS (ICD-477.9) Elevated hemidiaphragm

## 2010-09-08 NOTE — Miscellaneous (Signed)
  Clinical Lists Changes  Observations: Added new observation of PAST MED HX: CORONARY ARTERY DISEASE (ICD-414.00)...anomalous left circumflex from the right coronary cusp  /  cath 2009.Marland Kitchengrafts patent..normal LV CABG 2004.. EF  normal by cath 2009  (no echo data as of 11/11/2009) HYPERLIPIDEMIA, MIXED (ICD-272.2) DISEASE, HYPERTENSIVE HEART NOS, W/O HF (ICD-402.90) CHEST PAIN, ATYPICAL (ICD-786.59) COLONIC POLYPS (ICD-211.3) OSTEOARTHRITIS, CERVICAL SPINE (ICD-721.90) SEBACEOUS CYST, SCALP (ICD-706.2) HEARING LOSS, CONDUCTIVE, BILATERAL (ICD-389.00) OSTEOPOROSIS (ICD-733.00) ALLERGIC RHINITIS (ICD-477.9) Elevated hemidiaphragm    (05/10/2010 12:22) Added new observation of PRIMARY MD: Roderick Pee MD (05/10/2010 12:22)       Past History:  Past Medical History: CORONARY ARTERY DISEASE (ICD-414.00)...anomalous left circumflex from the right coronary cusp  /  cath 2009.Marland Kitchengrafts patent..normal LV CABG 2004.. EF  normal by cath 2009  (no echo data as of 11/11/2009) HYPERLIPIDEMIA, MIXED (ICD-272.2) DISEASE, HYPERTENSIVE HEART NOS, W/O HF (ICD-402.90) CHEST PAIN, ATYPICAL (ICD-786.59) COLONIC POLYPS (ICD-211.3) OSTEOARTHRITIS, CERVICAL SPINE (ICD-721.90) SEBACEOUS CYST, SCALP (ICD-706.2) HEARING LOSS, CONDUCTIVE, BILATERAL (ICD-389.00) OSTEOPOROSIS (ICD-733.00) ALLERGIC RHINITIS (ICD-477.9) Elevated hemidiaphragm

## 2010-09-08 NOTE — Progress Notes (Signed)
Summary: water & clogged ear  Phone Note Call from Patient   Summary of Call: One ear,right, still feels clogged & like there's water in it.  Flying out 10:30 today. Not able to wait on later call.   Anything to use to help?  Using Zyrtec & the NS daily.  For the flights will use Sudafed & Debrox.  Will call back as needed.   Initial call taken by: Rudy Jew, RN,  October 28, 2009 8:52 AM

## 2010-09-08 NOTE — Assessment & Plan Note (Signed)
Summary: per check out/sf   Visit Type:  Follow-up Primary Provider:  Neena Rhymes MD  CC:  CAD.  History of Present Illness: The patient is seen for followup of coronary artery disease.  I saw her last May 12, 2010.  At that time there was a soft carotid bruit.  Patient had a carotid Doppler that showed 0-39% bilateral disease.  No further workup is needed at this time.  She admitted to me some discomfort between her shoulder blades.  This has remained stable.  I am not convinced that this represents ischemia.  Current Medications (verified): 1)  Tenoretic 50 50-25 Mg  Tabs (Atenolol-Chlorthalidone) .... 1/2 Tab Once Daily 2)  Nitroquick 0.4 Mg  Subl (Nitroglycerin) .... As Needed 3)  Lipitor 40 Mg Tabs (Atorvastatin Calcium) .... Take One-Half Tablet By Mouth Daily. 4)  Adult Aspirin Low Strength 81 Mg  Tbdp (Aspirin) .Marland Kitchen.. 1 By Mouth Once Daily 5)  Tylenol 325 Mg Tabs (Acetaminophen) 6)  Calcium-Vitamin D 500-200 Mg-Unit Tabs (Calcium Carbonate-Vitamin D) .... Take One Tablet By Mouth Once Daily. 7)  Stool Softener .... As Needed 8)  Ra Ibuprofen 200 Mg Tabs (Ibuprofen) .... As Needed 9)  Flonase 50 Mcg/act Susp (Fluticasone Propionate) .... Uad 10)  Histex 2-30 Mg/1ml Liqd (Chlorpheniramine-Pseudoeph) .... Uad 11)  Zyrtec Allergy 10 Mg Tabs (Cetirizine Hcl) .... As Needed 12)  Citalopram Hydrobromide 20 Mg Tabs (Citalopram Hydrobromide) .... Take 1/2 Tablet By Mouth Daily  Allergies (verified): 1)  ! * Tetanus 2)  ! * Horse Serum 3)  ! * Ivp Dye 4)  ! Codeine  Past History:  Past Medical History: CORONARY ARTERY DISEASE (ICD-414.00)...anomalous left circumflex from the right coronary cusp  /  cath 2009.Marland Kitchengrafts patent..normal LV CABG 2004.. EF  normal by cath 2009  (no echo data as of 11/11/2009) HYPERLIPIDEMIA, MIXED (ICD-272.2) DISEASE, HYPERTENSIVE HEART NOS, W/O HF (ICD-402.90) CHEST PAIN, ATYPICAL (ICD-786.59) COLONIC POLYPS (ICD-211.3) OSTEOARTHRITIS, CERVICAL  SPINE (ICD-721.90) SEBACEOUS CYST, SCALP (ICD-706.2) HEARING LOSS, CONDUCTIVE, BILATERAL (ICD-389.00) OSTEOPOROSIS (ICD-733.00) ALLERGIC RHINITIS (ICD-477.9) Elevated hemidiaphragm Carotid bruit    October, 2011.Marland KitchenMarland KitchenDoppler November, 2011, 0-39% bilateral stenoses... serpentine common carotids    Review of Systems       Patient denies fever, chills, headache, sweats, rash, change in vision, change in hearing, chest pain, cough, nausea vomiting, urinary symptoms.  All other systems are reviewed and are negative.  Vital Signs:  Patient profile:   77 year old female Menstrual status:  hysterectomy Height:      62 inches Weight:      132 pounds BMI:     24.23 Pulse rate:   65 / minute BP sitting:   126 / 62  (left arm) Cuff size:   regular  Vitals Entered By: Hardin Negus, RMA (August 23, 2010 9:48 AM)  Physical Exam  General:  patient is stable today. Eyes:  no xanthelasma. Neck:  no jugular venous distention. Lungs:  lungs are clear.  Respiratory effort is nonlabored. Heart:  cardiac exam reveals an S1-S2.  No clicks or significant murmurs. Abdomen:  abdomen is soft. Extremities:  no peripheral edema. Psych:  patient is oriented to person time and place.  Affect is normal.   Impression & Recommendations:  Problem # 1:  CAROTID BRUIT (ICD-785.9) The patient had carotid Dopplers and has only mild bilateral disease.  No further workup at this time.  Problem # 2:  CORONARY ARTERY DISEASE (ICD-414.00)  Her updated medication list for this problem includes:    Tenoretic 50  50-25 Mg Tabs (Atenolol-chlorthalidone) .Marland Kitchen... Take as directed    Nitroquick 0.4 Mg Subl (Nitroglycerin) .Marland Kitchen... As needed    Adult Aspirin Low Strength 81 Mg Tbdp (Aspirin) .Marland Kitchen... 1 by mouth once daily Coronary disease is stable.  She is not having increase in the discomfort between her shoulder blades.  In fact this is reduced.  No further workup at this time.  Problem # 3:  HYPERLIPIDEMIA, MIXED  (ICD-272.2)  Her updated medication list for this problem includes:    Lipitor 20 Mg Tabs (Atorvastatin calcium) .Marland Kitchen... Take as directed Lipids are treated.  No change in therapy.  Problem # 4:  DISEASE, HYPERTENSIVE HEART NOS, W/O HF (ICD-402.90)  Her updated medication list for this problem includes:    Tenoretic 50 50-25 Mg Tabs (Atenolol-chlorthalidone) .Marland Kitchen... Take as directed    Adult Aspirin Low Strength 81 Mg Tbdp (Aspirin) .Marland Kitchen... 1 by mouth once daily Blood pressure is controlled today.  No change in therapy.  Patient Instructions: 1)  Your physician wants you to follow-up in:  1 year.  You will receive a reminder letter in the mail two months in advance. If you don't receive a letter, please call our office to schedule the follow-up appointment. Prescriptions: LIPITOR 20 MG TABS (ATORVASTATIN CALCIUM) Take as directed  #30 x 6   Entered by:   Meredith Staggers, RN   Authorized by:   Talitha Givens, MD, [facility]   Signed by:   Meredith Staggers, RN on 08/23/2010   Method used:   Faxed to ...       [facility] [phone] (retail)       [address]       [place], [place]  [address]       Ph: [phone]       Fax: [phone]   RxID:   [id number] TENORETIC 50 50-25 MG  TABS (ATENOLOL-CHLORTHALIDONE) Take as directed  #30 x 6   Entered by:   Meredith Staggers, RN   Authorized by:   Talitha Givens, MD, [facility]   Signed by:   Meredith Staggers, RN on 08/23/2010   Method used:   Faxed to ...       [facility] [address] (retail)       [address]       [place], [place]  [address]       Ph: [phone]       Fax: [phone]   RxID:   [id number]

## 2010-09-08 NOTE — Assessment & Plan Note (Signed)
Summary: EARS CLOGGED/NJR   Vital Signs:  Patient profile:   77 year old female Menstrual status:  hysterectomy Weight:      138 pounds Temp:     98.6 degrees F oral BP sitting:   120 / 80  (left arm) Cuff size:   regular  Vitals Entered By: Kern Reap CMA Duncan Dull) (September 27, 2009 4:45 PM)  Reason for Visit ears clogged and drainage  Primary Care Provider:  Roderick Pee MD   History of Present Illness: Wanda Wright is a 77 year old female, who comes in today for a flare of allergic rhinitis.  In 6 weeks.  She is going to [place] and would like to feel well.  She takes Claritin daily, but is not working.  Her symptoms or sneezing, head congestion, postnasal drip.  No history of asthma .  Allergies: 1)  ! * Tetanus 2)  ! * Horse Serum 3)  ! * Ivp Dye 4)  ! Codeine  Past History:  Past medical, surgical, family and social histories (including risk factors) reviewed for relevance to current acute and chronic problems.  Past Medical History: Reviewed history from 05/13/2009 and no changes required. CORONARY ARTERY DISEASE (ICD-414.00)...anomalous left circumflex from the right coronary cusp CABG 2004... catheter 2009 grafts patent HYPERLIPIDEMIA, MIXED (ICD-272.2) DISEASE, HYPERTENSIVE HEART NOS, W/O HF (ICD-402.90) CHEST PAIN, ATYPICAL (ICD-786.59) COLONIC POLYPS (ICD-211.3) OSTEOARTHRITIS, CERVICAL SPINE (ICD-721.90) SEBACEOUS CYST, SCALP (ICD-706.2) HEARING LOSS, CONDUCTIVE, BILATERAL (ICD-389.00) OSTEOPOROSIS (ICD-733.00) ALLERGIC RHINITIS (ICD-477.9) Elevated hemidiaphragm    Past Surgical History: Reviewed history from 11/13/2008 and no changes required. TAH H/H repair ACS/CABG GB Cholecystectomy.  Colonoscopy.   Multiple breast lumpectomies, all benign.   Family History: Reviewed history from 10/17/2007 and no changes required. father died 66, MI and stroke mother late 57s pacemaker dementia  breast cancerone brother one sister both in good  health  Social History: Reviewed history from 10/17/2007 and no changes required. Retired Married Never Smoked Alcohol use-no Drug use-no Regular exercise-yes  Review of Systems      See HPI  Physical Exam  General:  Well-developed,well-nourished,in no acute distress; alert,appropriate and cooperative throughout examination Head:  Normocephalic and atraumatic without obvious abnormalities. No apparent alopecia or balding. Eyes:  No corneal or conjunctival inflammation noted. EOMI. Perrla. Funduscopic exam benign, without hemorrhages, exudates or papilledema. Vision grossly normal. Ears:  External ear exam shows no significant lesions or deformities.  Otoscopic examination reveals clear canals, tympanic membranes are intact bilaterally without bulging, retraction, inflammation or discharge. Hearing is grossly normal bilaterally. Nose:  External nasal examination shows no deformity or inflammation. Nasal mucosa are pink and moist without lesions or exudates. Mouth:  Oral mucosa and oropharynx without lesions or exudates.  Teeth in good repair. Neck:  No deformities, masses, or tenderness noted. Lungs:  Normal respiratory effort, chest expands symmetrically. Lungs are clear to auscultation, no crackles or wheezes. Heart:  Normal rate and regular rhythm. S1 and S2 normal without gallop, murmur, click, rub or other extra sounds.   Impression & Recommendations:  Problem # 1:  ALLERGIC RHINITIS (ICD-477.9) Assessment Deteriorated  Her updated medication list for this problem includes:    Flonase 50 Mcg/act Susp (Fluticasone propionate) ..... Uad  Orders: Prescription Created Electronically [id number])  Complete Medication List: 1)  Tenoretic 50 50-25 Mg Tabs (Atenolol-chlorthalidone) .... 1/2 tab once daily 2)  Nitroquick 0.4 Mg Subl (Nitroglycerin) .... As needed 3)  Lipitor 40 Mg Tabs (Atorvastatin calcium) .... Take one-half tablet by mouth daily. 4)  Adult Aspirin Low  Strength 81  Mg Tbdp (Aspirin) .Marland Kitchen.. 1 by mouth once daily 5)  Tylenol 325 Mg Tabs (Acetaminophen) 6)  Calcium-vitamin D 500-200 Mg-unit Tabs (Calcium carbonate-vitamin d) .... Take one tablet by mouth once daily. 7)  Stool Softener  .... As needed 8)  Ra Ibuprofen 200 Mg Tabs (Ibuprofen) .... As needed 9)  Flonase 50 Mcg/act Susp (Fluticasone propionate) .... Uad 10)  Histex 2-30 Mg/39ml Liqd (Chlorpheniramine-pseudoeph) 11)  Prednisone 20 Mg Tabs (Prednisone) .... Uad  Patient Instructions: 1)  stop the Claritin and switch to plain Zyrtec 10 mg at bedtime, along with one shot of steroid nasal spray up each nostril at bedtime.  If in two to 3 weeks if don't see any improvement.  Take a short course of prednisone......... one tablet x 3 days, a half x 3 days, then half a tablet Monday, Wednesday, Friday, for a two week taper Prescriptions: FLONASE 50 MCG/ACT SUSP (FLUTICASONE PROPIONATE) UAD  #1 x 6   Entered and Authorized by:   Roderick Pee MD   Signed by:   Roderick Pee MD on 09/27/2009   Method used:   Electronically to        [facility] (retail)       [address] [place], [place]  [address]       Ph: [phone]       Fax: [phone]   RxID:   [id number] PREDNISONE 20 MG TABS (PREDNISONE) UAD  #30 x 1   Entered and Authorized by:   Roderick Pee MD   Signed by:   Roderick Pee MD on 09/27/2009   Method used:   Electronically to        [facility] [address] (retail)       [address] [place], [place]  [address]       Ph: [phone]       Fax: [phone]   RxID:   [id number]

## 2010-09-08 NOTE — Progress Notes (Signed)
Summary: GASTRO REFERRAL  Phone Note Other Incoming   Summary of Call: IN REFERENCE TO GASTRO REFERRAL, PER [facility], THIS PATIENT IS NOT DUE FOR HER NEXT COLONOSCOPY UNTIL JANUARY OF 2013.  I WILL INFORM PATIENT. Initial call taken by: Magdalen Spatz [facility],  August 12, 2010 1:06 PM  Follow-up for Phone Call        thank you.  pt indicated she was overdue.  i appreciate the info. Follow-up by: Neena Rhymes MD,  August 14, 2010 2:36 PM

## 2010-09-08 NOTE — Progress Notes (Signed)
Summary: dont schedule specialists for this time frame  Phone Note Call from Patient Call back at Home Phone [phone]   Caller: Patient Summary of Call: patient saw Dr Beverely Low today Wed 08/10/2010 and was told that she would be seeing some specialists  Patient will be going out of town from 08/29/2010 thru 2/6/201---please do not schedule any appointments for specialists during this time frame Initial call taken by: Jerolyn Shin,  August 10, 2010 10:54 AM  Follow-up for Phone Call        In reference to above message from patient, I am awaiting referral info. Magdalen Spatz [facility]  August 12, 2010 8:37 AM   Additional Follow-up for Phone Call Additional follow up Details #1::        referrals made.  thank you. Additional Follow-up by: Neena Rhymes MD,  August 12, 2010 11:36 AM

## 2010-09-08 NOTE — Progress Notes (Signed)
Summary: did not get  "an internal" on either visit  Phone Note Call from Patient Call back at Home Phone [phone]   Caller: Patient Summary of Call: patient called because she said she never got "an Internal"    (her wording)  when she saw Dr Nevada Crane today's visit or the one last November---thought she should have had "an internal" at one of these two visits---please call her at [phone] to discuss reason why she didnt Initial call taken by: Jerolyn Shin,  August 10, 2010 10:42 AM  Follow-up for Phone Call        Pt calling with several concerns: Pt had cpx today but no rectal or pap was performed. Pt notes that last pap was over 1 year ago. Pt states that she has not had a abnormal pap in pass and wonder if she still needs to have one done. Pls advise...Marland KitchenMarland KitchenFelecia Deloach CMA  August 10, 2010 12:42 PM   Additional Follow-up for Phone Call Additional follow up Details #1::        told pt at physical today that since she has had a hysterectomy there is no need for a pap today or in the future.  we are referring her to GI for colonoscopy which is why the rectal was not done.  i'm sorry she was concerned about this but there was a discussion about it. Additional Follow-up by: Neena Rhymes MD,  August 10, 2010 1:08 PM    Additional Follow-up for Phone Call Additional follow up Details #2::    left message to call office...........Marland KitchenFelecia Deloach CMA  August 10, 2010 2:43 PM  Discuss with patient................Marland KitchenFelecia Deloach CMA  August 10, 2010 4:25 PM

## 2010-09-08 NOTE — Assessment & Plan Note (Signed)
Summary: f14m/dfg   Visit Type:  Follow-up Primary Provider:  Roderick Pee MD  CC:  CAD.  History of Present Illness: The patient is seen for followup of coronary artery disease.  She underwent CABG in 2004.  She had a followup catheterization in 2009.  Grafts were patent at that time.  Her original symptom was significant discomfort between her shoulder blades.  She tells me today that she has had rare discomfort between her shoulder aches.  This is random.  It is not related to exercise.  There is no shortness of breath nausea vomiting or diaphoresis.  She remains active.   Current Medications (verified): 1)  Tenoretic 50 50-25 Mg  Tabs (Atenolol-Chlorthalidone) .... 1/2 Tab Once Daily 2)  Nitroquick 0.4 Mg  Subl (Nitroglycerin) .... As Needed 3)  Lipitor 40 Mg Tabs (Atorvastatin Calcium) .... Take One-Half Tablet By Mouth Daily. 4)  Adult Aspirin Low Strength 81 Mg  Tbdp (Aspirin) .Marland Kitchen.. 1 By Mouth Once Daily 5)  Tylenol 325 Mg Tabs (Acetaminophen) 6)  Calcium-Vitamin D 500-200 Mg-Unit Tabs (Calcium Carbonate-Vitamin D) .... Take One Tablet By Mouth Once Daily. 7)  Stool Softener .... As Needed 8)  Ra Ibuprofen 200 Mg Tabs (Ibuprofen) .... As Needed 9)  Flonase 50 Mcg/act Susp (Fluticasone Propionate) .... Uad 10)  Histex 2-30 Mg/32ml Liqd (Chlorpheniramine-Pseudoeph) .... Uad 11)  Zyrtec Allergy 10 Mg Tabs (Cetirizine Hcl) .... As Needed  Allergies (verified): 1)  ! * Tetanus 2)  ! * Horse Serum 3)  ! * Ivp Dye 4)  ! Codeine  Past History:  Past Medical History: CORONARY ARTERY DISEASE (ICD-414.00)...anomalous left circumflex from the right coronary cusp  /  cath 2009.Marland Kitchengrafts patent..normal LV CABG 2004.. EF  normal by cath 2009  (no echo data as of 11/11/2009) HYPERLIPIDEMIA, MIXED (ICD-272.2) DISEASE, HYPERTENSIVE HEART NOS, W/O HF (ICD-402.90) CHEST PAIN, ATYPICAL (ICD-786.59) COLONIC POLYPS (ICD-211.3) OSTEOARTHRITIS, CERVICAL SPINE (ICD-721.90) SEBACEOUS CYST, SCALP  (ICD-706.2) HEARING LOSS, CONDUCTIVE, BILATERAL (ICD-389.00) OSTEOPOROSIS (ICD-733.00) ALLERGIC RHINITIS (ICD-477.9) Elevated hemidiaphragm Carotid bruit    October, 2011    Review of Systems       Patient denies fever, chills, headache, sweats, rash, change in vision, change in hearing, chest pain, cough, nausea vomiting, urinary symptoms.  All other systems are reviewed and are negative.  Vital Signs:  Patient profile:   77 year old female Menstrual status:  hysterectomy Height:      62 inches Weight:      138 pounds BMI:     25.33 Pulse rate:   49 / minute BP sitting:   136 / 74  (left arm) Cuff size:   regular  Vitals Entered By: Hardin Negus, RMA (May 12, 2010 10:45 AM)  Physical Exam  General:  patient is quite stable today. Head:  head is atraumatic. Eyes:  no xanthelasma. Neck:  there is a very soft right carotid bruit. Chest Wall:  no chest wall tenderness. Lungs:  lungs are clear respiratory effort is not labored. Heart:  cardiac exam reveals S1 and S2.  There are no clicks or significant murmurs. Abdomen:  abdomen is soft. Msk:  no musculoskeletal deformities. Extremities:  no peripheral edema. Skin:  no skin rashes. Psych:  patient is oriented to person time and place.  Affect is normal.   Impression & Recommendations:  Problem # 1:  CAROTID BRUIT (ICD-785.9)  Patient has a very soft right carotid bruit.  She has documented coronary disease.  There is no evidence of a Doppler since  her CABG in 2004.  Doppler will be arranged. I will be in touch with her with the information.  Unless there is a significant finding on seeing her back in 3 months.  Orders: Carotid Duplex (Carotid Duplex)  Problem # 2:  CORONARY ARTERY DISEASE (ICD-414.00)  Her updated medication list for this problem includes:    Tenoretic 50 50-25 Mg Tabs (Atenolol-chlorthalidone) .Marland Kitchen... 1/2 tab once daily    Nitroquick 0.4 Mg Subl (Nitroglycerin) .Marland Kitchen... As needed    Adult Aspirin  Low Strength 81 Mg Tbdp (Aspirin) .Marland Kitchen... 1 by mouth once daily  Orders: EKG w/ Interpretation (93000) The patient does have coronary disease.  She has had slight discomfort between her shoulders.  Her original symptom with severe discomfort between her shoulder blades.  I'm not convinced at this time that this is ischemia.  However I will plan to see her back in 3 months for follow to be sure that this pattern is not more suggestive of ischemia.  EKG is done today and reviewed by me.  There is no significant change.  Problem # 3:  HYPERLIPIDEMIA, MIXED (ICD-272.2)  Her updated medication list for this problem includes:    Lipitor 40 Mg Tabs (Atorvastatin calcium) .Marland Kitchen... Take one-half tablet by mouth daily. Lipids are being treated.  No change in therapy.  Patient Instructions: 1)  Your physician recommends that you schedule a follow-up appointment in: 3 months 2)  Your physician recommends that you continue on your current medications as directed. Please refer to the Current Medication list given to you today. 3)  Your physician has requested that you have a carotid duplex. This test is an ultrasound of the carotid arteries in your neck. It looks at blood flow through these arteries that supply the brain with blood. Allow one hour for this exam. There are no restrictions or special instructions.

## 2010-09-08 NOTE — Assessment & Plan Note (Signed)
Summary: CPX - TRANS FROM BF/CBS  Flu Vaccine Consent Questions     Do you have a history of severe allergic reactions to this vaccine? no    Any prior history of allergic reactions to egg and/or gelatin? no    Do you have a sensitivity to the preservative Thimersol? no    Do you have a past history of Guillan-Barre Syndrome? no    Do you currently have an acute febrile illness? no    Have you ever had a severe reaction to latex? no    Vaccine information given and explained to patient? yes    Are you currently pregnant? no    Lot Number:[id number]   Exp Date:02/04/2011   Site Given  Left Deltoid IM    Vital Signs:  Patient profile:   77 year old female Menstrual status:  hysterectomy Height:      62 inches Weight:      138 pounds BMI:     25.33 Pulse rate:   50 / minute BP sitting:   140 / 70  (left arm)  Vitals Entered By: Doristine Devoid CMA (June 13, 2010 8:39 AM) CC: depression   History of Present Illness: 77 yo woman here today to establish care.  previously pt of Dr Tawanna Cooler.  1) Depression- had difficult time w/ both parents and husband passing w/in short period of time.  was previously recommended to see therapist and psychiatrist but pt doesn't feel this is necessary.  'i feel like i just need someone to listen every now and then and i'll be fine'.  reports she is still having some 'hard days' but 'i have more good than i do bad'.  sxs started 10+ yrs ago when she was caring for her husband prior to his death.  reports she still has interest in socializing, active in church.  daughter lives w/ her.  open to idea of meds but not spending 'the time or the money' to see a psychiatrist.  denies SI/HI  Preventive Screening-Counseling & Management  Alcohol-Tobacco     Alcohol drinks/day: 0  Current Medications (verified): 1)  Tenoretic 50 50-25 Mg  Tabs (Atenolol-Chlorthalidone) .... 1/2 Tab Once Daily 2)  Nitroquick 0.4 Mg  Subl (Nitroglycerin) .... As Needed 3)  Lipitor  40 Mg Tabs (Atorvastatin Calcium) .... Take One-Half Tablet By Mouth Daily. 4)  Adult Aspirin Low Strength 81 Mg  Tbdp (Aspirin) .Marland Kitchen.. 1 By Mouth Once Daily 5)  Tylenol 325 Mg Tabs (Acetaminophen) 6)  Calcium-Vitamin D 500-200 Mg-Unit Tabs (Calcium Carbonate-Vitamin D) .... Take One Tablet By Mouth Once Daily. 7)  Stool Softener .... As Needed 8)  Ra Ibuprofen 200 Mg Tabs (Ibuprofen) .... As Needed 9)  Flonase 50 Mcg/act Susp (Fluticasone Propionate) .... Uad 10)  Histex 2-30 Mg/66ml Liqd (Chlorpheniramine-Pseudoeph) .... Uad 11)  Zyrtec Allergy 10 Mg Tabs (Cetirizine Hcl) .... As Needed  Allergies (verified): 1)  ! * Tetanus 2)  ! * Horse Serum 3)  ! * Ivp Dye 4)  ! Codeine  Past History:  Past medical, surgical, family and social histories (including risk factors) reviewed, and no changes noted (except as noted below).  Past Medical History: Reviewed history from 05/12/2010 and no changes required. CORONARY ARTERY DISEASE (ICD-414.00)...anomalous left circumflex from the right coronary cusp  /  cath 2009.Marland Kitchengrafts patent..normal LV CABG 2004.. EF  normal by cath 2009  (no echo data as of 11/11/2009) HYPERLIPIDEMIA, MIXED (ICD-272.2) DISEASE, HYPERTENSIVE HEART NOS, W/O HF (ICD-402.90) CHEST PAIN, ATYPICAL (ICD-786.59)  COLONIC POLYPS (ICD-211.3) OSTEOARTHRITIS, CERVICAL SPINE (ICD-721.90) SEBACEOUS CYST, SCALP (ICD-706.2) HEARING LOSS, CONDUCTIVE, BILATERAL (ICD-389.00) OSTEOPOROSIS (ICD-733.00) ALLERGIC RHINITIS (ICD-477.9) Elevated hemidiaphragm Carotid bruit    October, 2011    Past Surgical History: Reviewed history from 11/13/2008 and no changes required. TAH H/H repair ACS/CABG GB Cholecystectomy.  Colonoscopy.   Multiple breast lumpectomies, all benign.   Family History: Reviewed history from 10/17/2007 and no changes required. father died 60, MI and stroke mother late 22s pacemaker dementia  breast cancer one brother one sister both in good health  Social  History: Reviewed history from 10/17/2007 and no changes required. Retired widowed 2000- husband w/ heart problems and COPD Never Smoked Alcohol use-no Drug use-no Regular exercise-yes  Review of Systems      See HPI  Physical Exam  General:  Well-developed,well-nourished,in no acute distress; alert,appropriate and cooperative throughout examination Psych:  Cognition and judgment appear intact. Alert and cooperative with normal attention span and concentration. No apparent delusions, illusions, hallucinations.  intermittantly tearful when talking about loss of loved ones   Impression & Recommendations:  Problem # 1:  DEPRESSIVE DISORDER (ICD-311) Assessment New  pt's sxs consistent w/ depression.  willing to start SSRI, not interested in counseling or psychiatry at this time.  total time spent w/ pt 37 minutes, >50% spent counseling. Her updated medication list for this problem includes:    Citalopram Hydrobromide 20 Mg Tabs (Citalopram hydrobromide) .Marland Kitchen... Take one tablet by mouth daily  Orders: Prescription Created Electronically [id number])  Complete Medication List: 1)  Tenoretic 50 50-25 Mg Tabs (Atenolol-chlorthalidone) .... 1/2 tab once daily 2)  Nitroquick 0.4 Mg Subl (Nitroglycerin) .... As needed 3)  Lipitor 40 Mg Tabs (Atorvastatin calcium) .... Take one-half tablet by mouth daily. 4)  Adult Aspirin Low Strength 81 Mg Tbdp (Aspirin) .Marland Kitchen.. 1 by mouth once daily 5)  Tylenol 325 Mg Tabs (Acetaminophen) 6)  Calcium-vitamin D 500-200 Mg-unit Tabs (Calcium carbonate-vitamin d) .... Take one tablet by mouth once daily. 7)  Stool Softener  .... As needed 8)  Ra Ibuprofen 200 Mg Tabs (Ibuprofen) .... As needed 9)  Flonase 50 Mcg/act Susp (Fluticasone propionate) .... Uad 10)  Histex 2-30 Mg/43ml Liqd (Chlorpheniramine-pseudoeph) .... Uad 11)  Zyrtec Allergy 10 Mg Tabs (Cetirizine hcl) .... As needed 12)  Citalopram Hydrobromide 20 Mg Tabs (Citalopram hydrobromide) .... Take one  tablet by mouth daily  Other Orders: Flu Vaccine 46yrs + MEDICARE PATIENTS (U0454) Administration Flu vaccine - MCR (U9811)  Patient Instructions: 1)  Please schedule a follow up in 1 month (30 minute appt) to discuss the depression 2)  Start the Citalopram daily.  If you have drowsiness w/ this medicine, take it at night 3)  Please call if you have any questions or concerns 4)  Hang in there!!!  You're doing great! Prescriptions: CITALOPRAM HYDROBROMIDE 20 MG TABS (CITALOPRAM HYDROBROMIDE) take one tablet by mouth daily  #30 x 3   Entered and Authorized by:   Neena Rhymes MD   Signed by:   Neena Rhymes MD on 06/13/2010   Method used:   Electronically to        [facility] [address] (retail)       [address] [place], [place]  [address]       Ph: [phone]       Fax: [phone]   RxID:   [id number]    Orders Added: 1)  Flu Vaccine  19yrs + MEDICARE PATIENTS [Q2039] 2)  Administration Flu vaccine - MCR [G0008] 3)  Est. Patient Level IV [91478] 4)  Prescription Created Electronically [G9562]     Preventive Care Screening  Colonoscopy:    Date:  07/22/2004    Results:  normal

## 2010-09-08 NOTE — Progress Notes (Signed)
Summary: Citalopram refill---see NOTE  Phone Note Refill Request Call back at Home Phone [phone] Message from:  Patient on August 24, 2010 12:18 PM  Refills Requested: Medication #1:  CITALOPRAM HYDROBROMIDE 20 MG TABS take 1/2 tablet by mouth daily.   Notes: NOTE****  patient says dosage was cut from 20mg  to 10mg  used [facility] last year, now has to use [facility] [place], [employer]  Next Appointment Scheduled: Caleen Essex 7/6  [place] Initial call taken by: Jerolyn Shin,  August 24, 2010 12:28 PM    Prescriptions: CITALOPRAM HYDROBROMIDE 20 MG TABS (CITALOPRAM HYDROBROMIDE) take 1/2 tablet by mouth daily  #15 x 6   Entered by:   Doristine Devoid CMA   Authorized by:   Neena Rhymes MD   Signed by:   Doristine Devoid CMA on 08/24/2010   Method used:   Electronically to        [facility] [address] (retail)       [phone]       [place], [place]  [address]       Ph: [phone]       Fax: [phone]   RxID:   [id number]

## 2010-09-08 NOTE — Letter (Signed)
Summary: Cancer Screening/[facility] Personalized Risk Profile  Cancer Screening/[facility] Personalized Risk Profile   Imported By: Lanelle Bal 06/23/2010 09:57:47  _____________________________________________________________________  External Attachment:    Type:   Image     Comment:   External Document

## 2010-09-08 NOTE — Assessment & Plan Note (Signed)
Summary: FOR SOMETHING IN HER MOUTH//PH   Vital Signs:  Patient profile:   77 year old female Menstrual status:  hysterectomy Weight:      136 pounds BMI:     24.96 Pulse rate:   56 / minute BP sitting:   122 / 70  (left arm)  Vitals Entered By: Doristine Devoid CMA (August 17, 2010 2:10 PM) CC: ridges on side of cheeks and sore in mouth   History of Present Illness: 77 yo woman here today for 'ridges' on cheeks and sores in mouth.  reports the ridges have been present since Christmas.  area on L cheek is improving.  R cheek still w/ ridge.  this is 1st time pt has noticed this.  feels sore on L is improving.  Current Medications (verified): 1)  Tenoretic 50 50-25 Mg  Tabs (Atenolol-Chlorthalidone) .... 1/2 Tab Once Daily 2)  Nitroquick 0.4 Mg  Subl (Nitroglycerin) .... As Needed 3)  Lipitor 40 Mg Tabs (Atorvastatin Calcium) .... Take One-Half Tablet By Mouth Daily. 4)  Adult Aspirin Low Strength 81 Mg  Tbdp (Aspirin) .Marland Kitchen.. 1 By Mouth Once Daily 5)  Tylenol 325 Mg Tabs (Acetaminophen) 6)  Calcium-Vitamin D 500-200 Mg-Unit Tabs (Calcium Carbonate-Vitamin D) .... Take One Tablet By Mouth Once Daily. 7)  Stool Softener .... As Needed 8)  Ra Ibuprofen 200 Mg Tabs (Ibuprofen) .... As Needed 9)  Flonase 50 Mcg/act Susp (Fluticasone Propionate) .... Uad 10)  Histex 2-30 Mg/85ml Liqd (Chlorpheniramine-Pseudoeph) .... Uad 11)  Zyrtec Allergy 10 Mg Tabs (Cetirizine Hcl) .... As Needed 12)  Citalopram Hydrobromide 20 Mg Tabs (Citalopram Hydrobromide) .... Take One Tablet By Mouth Daily  Allergies (verified): 1)  ! * Tetanus 2)  ! * Horse Serum 3)  ! * Ivp Dye 4)  ! Codeine  Review of Systems      See HPI  Physical Exam  General:  Well-developed,well-nourished,in no acute distress; alert,appropriate and cooperative throughout examination Mouth:  areas of mucosal hypertrophy along buccal mucosa bilaterally at bite line.  no evidence of apthous ulcers or open sores.   Impression &  Recommendations:  Problem # 1:  OTHER&UNSPECIFIED DISEASES THE ORAL SOFT TISSUES (ICD-528.9) Assessment New mucosal hypertrophy along bite line likely due to position of cheek between teeth, usually while sleeping.  provided reassurance to pt that this is normal and not concerning.  Complete Medication List: 1)  Tenoretic 50 50-25 Mg Tabs (Atenolol-chlorthalidone) .... 1/2 tab once daily 2)  Nitroquick 0.4 Mg Subl (Nitroglycerin) .... As needed 3)  Lipitor 40 Mg Tabs (Atorvastatin calcium) .... Take one-half tablet by mouth daily. 4)  Adult Aspirin Low Strength 81 Mg Tbdp (Aspirin) .Marland Kitchen.. 1 by mouth once daily 5)  Tylenol 325 Mg Tabs (Acetaminophen) 6)  Calcium-vitamin D 500-200 Mg-unit Tabs (Calcium carbonate-vitamin d) .... Take one tablet by mouth once daily. 7)  Stool Softener  .... As needed 8)  Ra Ibuprofen 200 Mg Tabs (Ibuprofen) .... As needed 9)  Flonase 50 Mcg/act Susp (Fluticasone propionate) .... Uad 10)  Histex 2-30 Mg/41ml Liqd (Chlorpheniramine-pseudoeph) .... Uad 11)  Zyrtec Allergy 10 Mg Tabs (Cetirizine hcl) .... As needed 12)  Citalopram Hydrobromide 20 Mg Tabs (Citalopram hydrobromide) .... Take one tablet by mouth daily  Patient Instructions: 1)  These ridges are from your cheeks getting caught between your teeth 2)  If they become painful, gargle w/ salt water or use an over the counter numbing agent like ambusol. 3)  If they continue to bother you, let us or  your dentist know 4)  Call with any questions or concerns 5)  Hang in there!   Orders Added: 1)  Est. Patient Level III [54098]

## 2010-09-08 NOTE — Assessment & Plan Note (Signed)
Summary: rto 1 month/cbs   Vital Signs:  Patient profile:   77 year old female Menstrual status:  hysterectomy Weight:      136 pounds BMI:     24.96 Pulse rate:   48 / minute BP sitting:   130 / 70  (left arm)  Vitals Entered By: Doristine Devoid CMA (July 11, 2010 1:24 PM) CC: 1 month roa    History of Present Illness: 77 yo woman here today for f/u on depression.  'i feel a little better' since starting meds.  crying less.  having more motivation.  feels less overwhelmed.  has cut back on part-time job.  is standing up for herself.  appears to have a lot of resentment at friends and family for things done in the past.  allergies- 'i have the sniffles something terrible'.  c/o PND.  no relief w/ Zyrtec, hasn't been using Flonase.  Current Medications (verified): 1)  Tenoretic 50 50-25 Mg  Tabs (Atenolol-Chlorthalidone) .... 1/2 Tab Once Daily 2)  Nitroquick 0.4 Mg  Subl (Nitroglycerin) .... As Needed 3)  Lipitor 40 Mg Tabs (Atorvastatin Calcium) .... Take One-Half Tablet By Mouth Daily. 4)  Adult Aspirin Low Strength 81 Mg  Tbdp (Aspirin) .Marland Kitchen.. 1 By Mouth Once Daily 5)  Tylenol 325 Mg Tabs (Acetaminophen) 6)  Calcium-Vitamin D 500-200 Mg-Unit Tabs (Calcium Carbonate-Vitamin D) .... Take One Tablet By Mouth Once Daily. 7)  Stool Softener .... As Needed 8)  Ra Ibuprofen 200 Mg Tabs (Ibuprofen) .... As Needed 9)  Flonase 50 Mcg/act Susp (Fluticasone Propionate) .... Uad 10)  Histex 2-30 Mg/61ml Liqd (Chlorpheniramine-Pseudoeph) .... Uad 11)  Zyrtec Allergy 10 Mg Tabs (Cetirizine Hcl) .... As Needed 12)  Citalopram Hydrobromide 20 Mg Tabs (Citalopram Hydrobromide) .... Take One Tablet By Mouth Daily  Allergies (verified): 1)  ! * Tetanus 2)  ! * Horse Serum 3)  ! * Ivp Dye 4)  ! Codeine  Review of Systems      See HPI  Physical Exam  General:  Well-developed,well-nourished,in no acute distress; alert,appropriate and cooperative throughout examination Ears:  External ear  exam shows no significant lesions or deformities.  Otoscopic examination reveals clear canals, tympanic membranes are intact bilaterally without bulging, retraction, inflammation or discharge. Hearing is grossly normal bilaterally. Nose:  edematous turbinates Mouth:  +PND Neck:  No deformities, masses, or tenderness noted. Lungs:  Normal respiratory effort, chest expands symmetrically. Lungs are clear to auscultation, no crackles or wheezes. Heart:  Normal rate and regular rhythm. S1 and S2 normal without gallop, murmur, click, rub or other extra sounds. Psych:  Cognition and judgment appear intact. Alert and cooperative with normal attention span and concentration. No apparent delusions, illusions, hallucinations.  more angry today   Impression & Recommendations:  Problem # 1:  DEPRESSIVE DISORDER (ICD-311) Assessment Unchanged pt feels meds are improving her tearfulness and lack of motivation.  pt still resentful at people for past 'wrongs'.  encouraged counseling to talk about some of these feelings.  pt declines at this time.  will follow. Her updated medication list for this problem includes:    Citalopram Hydrobromide 20 Mg Tabs (Citalopram hydrobromide) .Marland Kitchen... Take one tablet by mouth daily  Problem # 2:  ALLERGIC RHINITIS (ICD-477.9) Assessment: Deteriorated restart Flonase daily.  zyrtec as needed. Her updated medication list for this problem includes:    Flonase 50 Mcg/act Susp (Fluticasone propionate) ..... Uad    Zyrtec Allergy 10 Mg Tabs (Cetirizine hcl) .Marland Kitchen... As needed  Complete Medication List:  1)  Tenoretic 50 50-25 Mg Tabs (Atenolol-chlorthalidone) .... 1/2 tab once daily 2)  Nitroquick 0.4 Mg Subl (Nitroglycerin) .... As needed 3)  Lipitor 40 Mg Tabs (Atorvastatin calcium) .... Take one-half tablet by mouth daily. 4)  Adult Aspirin Low Strength 81 Mg Tbdp (Aspirin) .Marland Kitchen.. 1 by mouth once daily 5)  Tylenol 325 Mg Tabs (Acetaminophen) 6)  Calcium-vitamin D 500-200 Mg-unit  Tabs (Calcium carbonate-vitamin d) .... Take one tablet by mouth once daily. 7)  Stool Softener  .... As needed 8)  Ra Ibuprofen 200 Mg Tabs (Ibuprofen) .... As needed 9)  Flonase 50 Mcg/act Susp (Fluticasone propionate) .... Uad 10)  Histex 2-30 Mg/24ml Liqd (Chlorpheniramine-pseudoeph) .... Uad 11)  Zyrtec Allergy 10 Mg Tabs (Cetirizine hcl) .... As needed 12)  Citalopram Hydrobromide 20 Mg Tabs (Citalopram hydrobromide) .... Take one tablet by mouth daily  Patient Instructions: 1)  Schedule your complete physical for after the 1st of the year- do not eat before this appt 2)  Continue the Citalopram 3)  Restart the Flonase daily 4)  Add the Zyrtec as needed 5)  Call with any questions or concerns 6)  Happy Holidays!!! Prescriptions: FLONASE 50 MCG/ACT SUSP (FLUTICASONE PROPIONATE) UAD  #1 x 6   Entered and Authorized by:   Neena Rhymes MD   Signed by:   Neena Rhymes MD on 07/11/2010   Method used:   Electronically to        [facility] [address] (retail)       [address] [place], [place]  [address]       Ph: [phone]       Fax: [phone]   RxID:   [id number]    Orders Added: 1)  Est. Patient Level III [84132]

## 2010-10-04 ENCOUNTER — Telehealth (INDEPENDENT_AMBULATORY_CARE_PROVIDER_SITE_OTHER): Payer: Self-pay | Admitting: *Deleted

## 2010-10-13 NOTE — Progress Notes (Signed)
Summary: Lipitor refill  Phone Note Refill Request Call back at cell = [phone] Message from:  Patient on October 04, 2010 9:36 AM  Refills Requested: Medication #1:  LIPITOR 20 MG TABS Take as directed   Notes: Patient says her instructions on bottle state:  Take 1/2 tab per day at bedtime [facility], [address], [place], [place]       patient says she will be out tomorrow;  can she get a 90 day supply with refills??  Next Appointment Scheduled: Fri 7/6  [place] Initial call taken by: Jerolyn Shin,  October 04, 2010 9:38 AM    Prescriptions: LIPITOR 20 MG TABS (ATORVASTATIN CALCIUM) Take as directed  #90 x 1   Entered by:   Doristine Devoid CMA   Authorized by:   Neena Rhymes MD   Signed by:   Doristine Devoid CMA on 10/04/2010   Method used:   Electronically to        [facility] [address] (retail)       [phone]       [place], [place]  [address]       Ph: [phone]       Fax: [phone]   RxID:   [id number]

## 2010-12-20 NOTE — Assessment & Plan Note (Signed)
[facility]                            CARDIOLOGY OFFICE NOTE   VICKTORIA, MUCKEY                     MRN:          [id number]  DATE:05/19/2008                            DOB:          [birth year]    Ms. Araque is seen for cardiology followup.  I saw her last in April  2009.  Just prior to that, she had undergone a catheterization.  Her  status was stable.  It was felt that she had no obstructing lesions.  Since that time, she has actually done well.  She mentions that she has  had some type of GI virus that has affected her appetite, but she is  doing well in general.   PAST MEDICAL HISTORY:   ALLERGIES:  CODEINE and question IVP DYE.   MEDICATIONS:  Aspirin, atenolol, chlorthalidone, calcium and vitamin D,  and Lipitor 10 daily.   OTHER MEDICAL PROBLEMS:  See the list on my note of November 18, 2007.   REVIEW OF SYSTEMS:  She has very rare discomfort in the center of her  back.  She is getting over her GI virus.  Otherwise, she has no fevers  or chills.  No musculoskeletal problems.  Otherwise, her review of  systems is negative.   PHYSICAL EXAMINATION:  VITAL SIGNS:  Blood pressure is 138/73 with pulse  of 50.  GENERAL:  The patient is oriented to person, time, and place.  Affect is  normal.  HEENT:  No xanthelasma.  She has normal extraocular motion.  There are  no carotid bruits.  There is no jugular venous distention.  LUNGS:  Clear.  Respiratory effort is not labored.  CARDIAC:  S1 and S2.  There are no clicks or significant murmurs.  ABDOMEN:  Soft.  EXTREMITIES:  She has no peripheral edema.   EKG reveals no significant change.   Problems are listed on the note of November 18, 2007.  #1.  Hypertension, stable.  #3.  Hyperlipidemia.  She is now on a statin.  She has had followup labs  through Dr. Tawanna Cooler.  #5.  Status post coronary artery disease with cath in the spring of 2009  with a stable status.  She is doing well.  No change in  her meds.  Six-  month followup.     Luis Abed, MD, [facility]  Electronically Signed   JDK/MedQ  DD: 05/19/2008  DT: 05/20/2008  Job #: [id number]   cc:   Tinnie Gens A. Tawanna Cooler, MD

## 2010-12-20 NOTE — Discharge Summary (Signed)
NAME:  Wanda Wright, Wanda Wright NO.:  [id number]   MEDICAL RECORD NO.:  [id number]          PATIENT TYPE:  INP   LOCATION:  3709                         FACILITY:  [facility]   PHYSICIAN:  Veverly Fells. Excell Seltzer, MD  DATE OF BIRTH:  [birth year]   DATE OF ADMISSION:  11/06/2007  DATE OF DISCHARGE:  11/07/2007                               DISCHARGE SUMMARY   PRIMARY CARDIOLOGIST:  Luis Abed, MD, [facility].   PRIMARY CARE [place]:  Eugenio Hoes. Tawanna Cooler, MD.   DISCHARGE DIAGNOSIS:  Chest pain.   SECONDARY DIAGNOSES:  1. Coronary artery disease, status post coronary artery bypass graft      in 2004.  2. Hypertension.  3. Hyperlipidemia.  4. Gastroesophageal reflux disease.  5. Fibrocystic breast disease.  6. History of colon polyps.   ALLERGIES:  CODEINE AND ALTACE.   PROCEDURES:  Left heart cardiac catheterization.   HISTORY OF PRESENT ILLNESS:  A 77 year old female with prior history of  CAD status post CABG, who was in her usual state of health until  recently, when she began to experience exertional substernal chest  discomfort and pressure while working out.  She recently saw her primary  care Jayston Trevino for this and was advised to follow up with cardiology,  although she did not.  On November 06, 2007, she had recurrent discomfort  and presented to the [facility] ED, where ECG showed no acute changes.  The patient was admitted for further evaluation and rule out.   HOSPITAL COURSE:  Wanda Wright ruled out for MI by cardiac markers x3.  A  decision was made to proceed with left heart cardiac catheterization,  which took place on November 07, 2007, revealing an occluded left main with  a patent vein graft to the ramus intermedius as well as a patent LIMA to  the LAD.  There was an anomalous left circumflex, which was normal as  well as a widely patent right coronary artery.  EF was normal.  As there  were no targets for intervention, a decision was made to continue  medical therapy.  [name] has not had any recurrent chest discomfort,  has been ambulating without difficulty, and will be discharged home  today in good condition.   DISCHARGE LABS:  Hemoglobin 12.2, hematocrit 35.7, WBC 4.2, platelets  155, and MCV 87.4.  Sodium 141, potassium 3.4 (replaced prior to  discharge), chloride 108, CO2 27, BUN 8, creatinine 0.79, and glucose  95.  INR was 0.9.  Total bilirubin 1.5, alkaline phosphatase 92, AST 40,  ALT 11, and albumin 3.7.  Cardiac markers negative x3.  Calcium 8.2.  BNP 101.0.   DISPOSITION:  The patient is being discharged home today in good  condition.   FOLLOWUP PLANS AND APPOINTMENTS:  We have arranged for followup with Dr.  Willa Rough on November 18, 2007, at 4 p.m.  She is asked to follow up  with Dr. Tawanna Cooler as previously scheduled.   DISCHARGE MEDICATIONS:  1. Aspirin 81 mg daily.  2. Atenolol/hydrochlorothiazide 50/12.5 mg half a tablet daily.  3. Claritin as  previously taken.  4. FiberChoice as previously taken.  5. Nitroglycerin 0.4 mg sublingual p.r.n. chest pain.   OUTSTANDING LABORATORY STUDIES:  None.   DURATION DISCHARGE ENCOUNTER:  35 minutes including physician time.      Nicolasa Ducking, ANP      Veverly Fells. Excell Seltzer, MD  Electronically Signed    CB/MEDQ  D:  11/08/2007  T:  11/08/2007  Job:  [id number]   cc:   Tinnie Gens A. Tawanna Cooler, MD

## 2010-12-20 NOTE — Cardiovascular Report (Signed)
NAME:  CRYSTA, GULICK NO.:  [id number]   MEDICAL RECORD NO.:  [id number]          PATIENT TYPE:  INP   LOCATION:  3709                         FACILITY:  [facility]   PHYSICIAN:  Veverly Fells. Excell Seltzer, MD  DATE OF BIRTH:  May 26, 1929   DATE OF PROCEDURE:  11/07/2007  DATE OF DISCHARGE:                            CARDIAC CATHETERIZATION   PROCEDURE:  Left heart catheterization, selective coronary angiography,  left ventricular angiography, LIMA angiography, saphenous vein graft  angiography.   INDICATIONS:  Ms. Ibsen is a very nice 77 year old woman who presented  in 2004 with an acute myocardial infarction and had critical left main  stenosis.  She was treated with emergency bypass surgery that involved a  LIMA to LAD and saphenous vein graft to obtuse marginal.  Her right  coronary artery had no significant disease.  She has done well over the  last 5 years, but over the past few weeks she has developed recurrent  chest pain with exertion.  She was referred for cardiac catheterization  for further evaluation.   Risks and indications of the procedure were reviewed with the patient.  Informed consent was obtained.  The right groin was prepped, draped, and  anesthetized 1% lidocaine.  Using a modified Seldinger technique, a 6-  French sheath was placed in the right femoral artery.  Standard 6-French  Judkins catheters were used for coronary angiography.  The JR-4 catheter  was used for saphenous vein graft angiography.  A 5-French LIMA catheter  was used for LIMA angiography.  All catheter exchanges were performed  over a guidewire.  The patient tolerated the procedure well.  There were  no immediate complications.   FINDINGS:  Aortic pressure 127/57, with a mean of 85, left ventricular  pressure 129/16.   CORONARY ANGIOGRAPHY:  Left mainstem:  The left mainstem is occluded.  There is no antegrade flow into either the LAD or the left circumflex.   Right coronary  artery:  The right coronary artery is widely patent  throughout.  The vessel supplies a small RV marginal branch, a moderate-  size PDA branch, and two posterolateral branches.  There are no  significant stenoses in the right coronary artery or the branch vessels.   Left circumflex:  The left circumflex has an anomalous origin.  It is a  small vessel that arises from the right coronary cusp.  There are no  obvious stenoses, but the vessel is very small.   Saphenous vein graft to ramus intermedius is patent.  The intermediate  branch has mild diffuse stenosis.  The ramus is a small vessel.  The  vein graft fills the LAD in retrograde fashion.   LIMA to LAD is widely patent.  The LAD is a moderate-sized vessel that  courses down to the LV apex.  There is a proximal diagonal branch that  fills in retrograde fashion from the LIMA.   Left ventriculography shows normal LV function.  There are no  significant wall motion abnormalities.  The LVEF is 60%.  There is no  mitral regurgitation.   ASSESSMENT:  1.  Left mainstem occlusion.  2. Patent saphenous vein graft to ramus intermedius.  3. Patent left internal mammary artery to left anterior descending.  4. Widely patent right coronary artery.  5. 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  6. Normal left ventricular function.   PLAN:  Recommend continued medical therapy for the patient's CAD.  She  has widely patent bypass grafts.      Veverly Fells. Excell Seltzer, MD  Electronically Signed     MDC/MEDQ  D:  11/07/2007  T:  11/08/2007  Job:  [id number]   cc:   Luis Abed, MD, [facility]

## 2010-12-20 NOTE — H&P (Signed)
NAME:  Wanda Wright, Wanda Wright NO.:  [id number]   MEDICAL RECORD NO.:  [id number]          PATIENT TYPE:  EMS   LOCATION:  MAJO                         FACILITY:  [facility]   PHYSICIAN:  Luis Abed, MD, FACCDATE OF BIRTH:  Jul 31, 1929   DATE OF ADMISSION:  11/06/2007  DATE OF DISCHARGE:                              HISTORY & PHYSICAL   PRIMARY CARE PHYSICIAN:  Tinnie Gens A. Tawanna Cooler, MD.   PRIMARY CARDIOLOGIST:  Luis Abed, MD   CHIEF COMPLAINT:  Chest pain.   HISTORY OF PRESENT ILLNESS:  Ms. Bachicha is a 77 year old female with a  history of coronary artery disease.  She had bypass surgery in 2004, but  has not had a problems since then, so has not had any stress testing or  heart catheterizations.  Recently she has noted chest pressure after  exerting herself by working out at [employer]. Sometimes the chest would be  shortly afterwards, and sometimes even as late as the next day.  The  chest pressure would resolve without intervention.  She saw her primary  care physician for this, and he was concerned about a cardiac origin for  it, so he referred her to cardiology, but she has not yet been seen.   This a.m. Ms. Piontek was awakened by substernal chest pain described as a  pressure that reached a 4/10.  She had some slight shortness of breath  with this, and nausea, but no diaphoresis.  She took sublingual  nitroglycerin x1, and it relieved the pain, temporarily, but the  symptoms returned.  She was able to get back to sleep; however, and when  she woke up at approximately 9:30 the pain was still there.  She tried  to eat, but was unable to eat because of the nausea.  She did not try  any more nitroglycerin.  When her symptoms did not resolve she called  our office and we told her to come to the emergency room.  In the  emergency room she has not yet any medical therapy and is currently  experiencing pain at a 2/10.  It radiates through to her back.  This is  the same as  her recent exertional symptoms, but she cannot remember  whether or not it is the same as the symptoms she had before her bypass  surgery.   PAST MEDICAL HISTORY:  1. Status post aortocoronary bypass surgery in 2004 with limited LAD,      SVT to OM. This was secondary to 99% stenosis of the distal left      main with compromised flow to the LAD, circumflex occluded, and RCA      was normal.  No LV-gram or EF was ever assessed.  2. Hypertension.  3. Hyperlipidemia.  4. Gastroesophageal reflux disease.  5. Fibrocystic breast disease.  6. History of colon polyps.  7. History of elevated left hemidiaphragm, postoperatively, resolved.   SURGICAL HISTORY:  1. She is status post cardiac catheterization.  2. Bypass surgery.  3. Cholecystectomy.  4. Colonoscopy.  5. Hysterectomy.  6. Multiple breast lumpectomies, all benign.  ALLERGIES:  She is allergic or intolerant to CODEINE and ALTACE.   CURRENT MEDICATIONS:  1. Atenolol HCT 50/25 mg 1/2 tab daily.  2. Aspirin 81 mg a day.  3. Claritin 10 mg a day.  4. Fiber capsules, Metamucil, or prunes daily for constipation.  5. Claritin 10 mg daily.   SOCIAL HISTORY:  She lives in [place] and her daughter lives with  her.  She works part-time as a Investment banker, corporate.  She quit tobacco  greater than 50 years ago, with only approximately a 2-pack-year history  and has never abused alcohol or drugs.   FAMILY HISTORY:  Notable that her parents were in their 58s when they  died.  Her father had a CVA, and her mother had a pacemaker, but neither  one had known coronary artery disease.  She has no siblings with  coronary artery disease.   REVIEW OF SYSTEMS:  She has not had any recent fevers, chills, or  illnesses.  She has had some significant arthralgias and pain in her  right hand suspected to be from carpal tunnel.  She has occasional  reflux symptoms and chronic constipation.  She has had dyspnea on  exertion, recently, more so than  usual in addition to the shortness of  breath with the chest pain which is described above.  She has not been  coughing or wheezing.  A full 14-point review of systems is otherwise  negative.   PHYSICAL EXAMINATION:  VITAL SIGNS:  Temperature 98, initial blood  pressure 163/124, now 156/98; heart rate 53; respiratory rate 16; O2  saturation 96% on room air.  GENERAL:  She is a well-developed, elderly, white female in no acute  distress.  HEENT:  Normal.  NECK:  There is no lymphadenopathy, thyromegaly, bruit, or JVD noted.  CARDIOVASCULAR:  Her heart is regular in rate and rhythm with an S1-S2.  No significant murmur, rub, or gallop is noted.  Distal pulses are  intact in all 4 extremities, but she has a slightly decreased right DP.  No femoral bruits are appreciated.  LUNGS:  Essentially clear to auscultation bilaterally.  SKIN:  No rashes or lesions are noted.  ABDOMEN:  Soft and nontender with active bowel sounds.  EXTREMITIES:  She has no cyanosis, clubbing, or edema noted.  MUSCULOSKELETAL:  There is no joint deformity or effusion, and no spine  or CVA tenderness is noted.  NEUROLOGIC:  She is alert and oriented.  Cranial nerves II-XII grossly  intact.   CHEST X-RAY:  Pending.   EKG:  There is sinus bradycardia at a rate of 53, with no acute ischemic  changes, and no pathologic Q waves.   LABORATORY VALUES:  Pending.   IMPRESSION:  1. Chest pain.  Ms. Mcdaris has a history of bypass surgery, and recent      return of chest pain with exertion which, today, woke her; and has      lasted for a prolonged period of time. The pain is mild, but      persistent.  Her EKG is not acute.  She is a good historian.  She      will be admitted, and we will increase her aspirin to 325 mg a day.      We will add IV nitroglycerin and heparin.  She will be continued on      her with no changes as she is asymptomatic with a heart rate in the      50s.  We will  add a statin to her medication  regimen. We will check      cardiac enzymes in addition to 1 set of pack of cigarettes markers      now.  Consideration should be given to cardiac catheterization,      especially with chest pain that started at rest, and has lasted for      a prolonged period of time.  However, if her cardiac enzymes are      negative, and her chest pain remains resolved; consideration can be      given to stress testing.  She will be continued on her other home      medications, and we will check a lipid profile.      Theodore Demark, PA-C      Luis Abed, MD, [facility]  Electronically Signed    RB/MEDQ  D:  11/06/2007  T:  11/06/2007  Job:  [id number]   cc:   Tinnie Gens A. Tawanna Cooler, MD

## 2010-12-20 NOTE — Assessment & Plan Note (Signed)
[facility]                            CARDIOLOGY OFFICE NOTE   SHAWNELL, DYKES                     MRN:          [id number]  DATE:11/18/2007                            DOB:          June 29, 1929    Ms. Casale has known coronary disease.  Recently, she felt worse and we  actually saw her in the emergency room at [facility].  MI was ruled out and we proceeded with a catheterization.  It  was done by Dr. Excell Seltzer on November 07, 2007.  She has a left main  occlusion, SVG to the ramus intermedius with patent LIMA to the LAD was  patent.  Her right coronary artery was widely patent.  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  Her overall status seem to be stable and  she was discharged home.  Since being at home, she really is stable.  She is not having any significant chest pain.  She has had no problems  from her cath site.   PAST MEDICAL HISTORY:   ALLERGIES:  CODEINE and questionable IVP DYE.   MEDICATIONS:  1. Multivitamin.  2. Aspirin.  3. Atenolol.  4. Calcium.  5. She stopped her simvastatin which of course we want her to be on.      It is important to encourage her to take a Statin.   OTHER MEDICAL PROBLEMS:  See the list below.   REVIEW OF SYSTEMS:  She has no significant complaints today.  Her review  of systems is negative.   PHYSICAL EXAMINATION:  VITAL SIGNS:  Blood pressure is 147/81 with a  pulse of 55.  GENERAL APPEARANCE:  The patient is oriented to person, time and place.  Affect is normal.  HEENT:  No xanthelasma.  She has normal extraocular motion.  NECK:  There are no carotid bruits.  There is no jugular venous  distention.  LUNGS:  Clear.  Respiratory effort is not labored.  CARDIAC:  Exam reveals an S1 with an S2.  There are no clicks or  significant murmurs.  ABDOMEN:  Soft.  There is no peripheral edema.   PROBLEMS:  1.  Hypertension, treated.  2. History of colon polyps.  3. Hyperlipidemia.  I would really like her to be on the Statin.  4. History of fibrocystic breast disease.  5. Status post coronary artery bypass grafting in 2004 with grafts      patent by cath on November 07, 2007.  6. History of elevated hemidiaphragm.  7. Cough from Altace.   Her status is stable.  I will see her in six months.     Luis Abed, MD, [facility]  Electronically Signed    JDK/MedQ  DD: 11/18/2007  DT: 11/18/2007  Job #: [id number]   cc:   Tinnie Gens A. Tawanna Cooler, MD

## 2010-12-20 NOTE — Assessment & Plan Note (Signed)
[facility]                            CARDIOLOGY OFFICE NOTE   NKENGE, SONNTAG                     MRN:          [id number]  DATE:03/18/2007                            DOB:          1928-09-27    Wanda Wright has coronary disease, post CABG in January of 2004.  She has  actually done quite well.  She is fully active.  She is not having chest  pain or shortness of breath, and there has been no syncope or  presyncope.  She is going about full activities.   PAST MEDICAL HISTORY:   ALLERGIES:  CODEINE.   MEDICATIONS:  1. Simvastatin 40.  2. Calcium.  3. Aspirin 81.  4. Atenolol/chlorthalidone 50/25 one-half tablet daily.   OTHER MEDICAL PROBLEMS:  See the list below.   REVIEW OF SYSTEMS:  She really is doing well and her review of systems  is negative.   PHYSICAL EXAMINATION:  The blood pressure is 132/72 with a pulse of 52.  Her weight is 164 pounds.  The patient is oriented to person, time, and place and the affect is  normal.  HEENT:  Reveals no xanthelasma.  She has normal extraocular  motion.  LUNGS:  Clear.  Respiratory effort is not labored.  CARDIAC EXAM:  Reveals an S1 with an S2.  There are no clicks or  significant murmurs.  The abdomen is soft.  There are no masses or bruits.  She has 1+ distal pulses.   EKG shows sinus bradycardia.   PROBLEMS:  1. Hypertension, treated.  2. History of colon polyps.  3. Hyperlipidemia, on medications, and she will have followup lipids      with Dr. Tawanna Cooler with her yearly physical within the next few weeks.  4. History of fibrocystic breast disease.  5. Post coronary artery bypass graft in 2004.  6. History of elevated hemidiaphragm.  7. History of cough from Altace in the past.   There was also question of an abnormal chest x-ray.  She tells me that  it has been followed up and that it is fine.  No further workup is in  order my viewpoint.   Cardiac is stable.  I will see her back in 1  year.     Luis Abed, MD, [facility]  Electronically Signed    JDK/MedQ  DD: 03/18/2007  DT: 03/18/2007  Job #: [id number]   cc:   Tinnie Gens A. Tawanna Cooler, MD

## 2010-12-21 ENCOUNTER — Encounter: Payer: Self-pay | Admitting: Family Medicine

## 2010-12-22 ENCOUNTER — Telehealth: Payer: Self-pay | Admitting: Family Medicine

## 2010-12-22 NOTE — Telephone Encounter (Signed)
Referral entered and marked Urgent. Will FWD to [facility].

## 2010-12-22 NOTE — Telephone Encounter (Signed)
Ok to do referral for constipation?

## 2010-12-22 NOTE — Telephone Encounter (Signed)
Ok to refer- please enter and call Renee to expedite referral

## 2010-12-22 NOTE — Telephone Encounter (Signed)
Patient called to request referral or she is asking if she needs one---she has Medicare and medicare supplement----wants to see Dr Lupe Carney at [facility] [phone]) for Constipation---problem has been going on since Christmas---cant get in to see her GI doctor until June---says it takes a lot of medication to be able to go to the bathroom---says she has heart trouble and doesn't want to "strain her heart"       Has talked to [facility] and they will hold appt for Monday morning 5/21 for her if we can get referral done

## 2010-12-23 NOTE — Op Note (Signed)
NAME:  Wanda Wright, Wanda Wright                            ACCOUNT NO.:  [id number]   MEDICAL RECORD NO.:  [id number]                   PATIENT TYPE:  INP   LOCATION:  2316                                 FACILITY:  [facility]   PHYSICIAN:  Salvatore Decent. Cornelius Moras, M.D.              DATE OF BIRTH:  20-Apr-1929   DATE OF PROCEDURE:  09/05/2002  DATE OF DISCHARGE:                                 OPERATIVE REPORT   PREOPERATIVE DIAGNOSIS:  Critical left main disease with acute evolving  myocardial infarction.   POSTOPERATIVE DIAGNOSIS:  Critical left main disease with acute evolving  myocardial infarction.   PROCEDURE:  Emergency median sternotomy for coronary artery bypass grafting  x2 (left internal mammary artery to distal left anterior descending coronary  artery, saphenous vein graft to circumflex marginal branch).   SURGEON:  Salvatore Decent. Cornelius Moras, M.D.   ASSISTANT:  Eber Hong, P.A.   ANESTHESIA:  General.   BRIEF CLINICAL NOTE:  The patient is a 77 year old female with history of  hypertension, followed by Dr. Eugenio Hoes. Todd with no previous cardiac  history.  The patient describes a two-week history of not feeling well.  She  developed severe substernal chest pain at rest on the evening prior to  admission associated with shortness of breath.  The pain subsided  eventually.  On the day of admission, the patient developed another episode  of similar pain, which was more severe, associated with diaphoresis and  severe generalized weakness.  She was brought by EMS to the emergency room  where she was diagnosed with acute evolving myocardial infarction with  abnormal electrocardiogram demonstrating diffuse ST segment elevation, as  well as positive initial set of cardiac enzymes.  The patient was initially  evaluated by Dr. Willa Rough and brought promptly to the cardiac  catheterization lab by Dr. Randa Evens, where she underwent diagnostic  catheterization.  This demonstrates critical  left main coronary stenosis  with 99% stenosis of the distal left main coronary artery.  The right  coronary artery is dominant and without significant disease.  Left  ventricular function was not assessed.  An intra-aortic balloon pump was  placed, and emergency cardiac surgical consultation was obtained.   OPERATIVE CONSENT:  The patient and her family had been counseled at length  regarding the indications and potential benefits of coronary artery bypass  grafting.  They understand the somewhat emergent nature of her presentation  and need for prompt intervention.  They understand and accept all associated  risks of surgery including but not limited to risk of death, stroke,  myocardial infarction, congestive heart failure, respiratory failure,  bleeding requiring blood transfusion, arrhythmia, infection, and recurrent  coronary artery disease.  They desire to proceed as described.   OPERATIVE NOTE IN DETAIL:  The patient was brought to the operating room on  the above-mentioned date and placed in the supine position on the  operating  table.  Central monitoring was established by the anesthesia service under  the care and direction of Dr. Bedelia Person.  Specifically, a Swan-Ganz catheter  was placed through the right internal jugular approach.  A left brachial  arterial line was placed.  Intravenous antibiotics were administered.  Following induction of general endotracheal anesthesia, a Foley catheter is  placed.   Baseline transesophageal echocardiogram is performed by Dr. Gypsy Balsam.  This  demonstrates fairly normal left ventricular function with left ventricular  hypertrophy.  There is trivial mitral regurgitation.  No other significant  abnormalities are identified.   The patient's chest, abdomen, both groins, and both lower extremities are  prepared and draped in a sterile manner.  A median sternotomy incision is  performed, and the left internal mammary artery is dissected from the  chest  wall and prepared for bypass grafting.  This vessel is notably good quality  conduit.  Simultaneously, the saphenous vein is obtained from the patient's  left lower leg through a longitudinal incision.  The saphenous vein is a  good quality conduit.  The patient is heparinized systemically.  The  pericardium is opened.  The ascending aorta is normal in appearance.  The  ascending aorta and the right atrium are cannulated for cardiopulmonary  bypass.  Adequate approbations verified.   Cardiopulmonary bypass is begun, and the surface of the heart is inspected.  A distal site is selected for coronary bypass grafting.  Of note, the left  circumflex system is fairly small  and consists of essentially only a single  circumflex marginal branch.  There is a large right coronary artery with  right dominant coronary circulation that supplies the majority of the entire  posterolateral wall.  The left internal mammary artery and saphenous vein  graft are trimmed to appropriate length.  A temperature probe is placed in  the left ventricular septum, and a cardioplegic catheter is placed in the  ascending aorta.   The patient is allowed to cool to 34 degrees systemic temperature.  The  aortic cross clamp is applied, and cardioplegia is delivered in an antegrade  fashion through the aortic root.  Iced saline slush is applied for topical  hypothermia.  The initial cardioplegic arrest and myocardial cooling are  felt to be excellent.  Repeat doses of this cardioplegia are administered  antegrade through the subsequently placed vein grafts to maintain septal  temperature below 15 degrees centigrade.  The following distal coronary  anastomoses are performed:   1. The circumflex marginal branch is grafted with the saphenous vein graft     in an end-to-side fashion.  This coronary measures 1.7 mm in diameter and     is of good quality. 2. The left anterior descending coronary artery is grafted with  the left     internal mammary artery in an end-to-side fashion.  This coronary     measures 2.2 mm in diameter and is of good quality.  The single proximal     saphenous vein anastomosis is performed directly to the ascending aorta     prior to removal of the aortic cross clamp.  The septal temperature is     noted to rise rapidly and dramatically upon reperfusion of the left     internal mammary artery.  The aortic cross clamp is removed after a total     cross clamp time of 30 minutes.  3. The heart began to beat spontaneously without need for cardioversion.  All proximal and distal anastomoses are inspected for hemostasis and     appropriate graft orientation.  The patient is rewarmed to 37 degrees     centigrade temperature.  The patient is weaned from cardiopulmonary     bypass without difficulty.  The patient's rhythm at separation from     bypass is normal sinus rhythm.  No inotropic support is required.  Total     cardiopulmonary bypass time through the operation is 38 minutes.     Transesophageal echocardiogram performed by Dr. Gypsy Balsam after separation     from bypass demonstrates preserved left ventricular function.  Overall     cardiac output is normal.   The venous and arterial cannulae are removed uneventfully.  Protamine is  administered to reverse the anticoagulation.  The mediastinum and the left  chest are irrigated with saline solution containing vancomycin.  Meticulous  surgical hemostasis is ascertained.  The mediastinum and the left chest are  drained with three chest tubes placed through separate stab incisions  inferiorly.  The median sternotomy is closed in a routine fashion.  The left  lower leg incision is closed in multiple layers in a routine fashion.  All  skin incisions are closed with subcuticular skin closures.   The patient tolerated the procedure well and was transported to the surgical  intensive care unit in a stable condition.  There were no  intraoperative  complications.  All sponge, instrument, and needle counts were verified  correct at the completion of the operation.  No blood products were  administered.                                               Salvatore Decent. Cornelius Moras, M.D.    CHO/MEDQ  D:  09/05/2002  T:  09/06/2002  Job:  [id number]   cc:   Salvadore Farber, M.D. [facility]  [facility]  [address] [place], [place] [address]  Fax: 1   Willa Rough, M.D. [facility]   Tinnie Gens A. Tawanna Cooler, M.D. [facility]  [address] [place]  [place] [address]  Fax: 1

## 2010-12-23 NOTE — Op Note (Signed)
NAME:  Wanda Wright, Wanda Wright                            ACCOUNT NO.:  [id number]   MEDICAL RECORD NO.:  [id number]                   PATIENT TYPE:  AMB   LOCATION:  DAY                                  FACILITY:  [facility]   PHYSICIAN:  Anselm Pancoast. Zachery Dakins, M.D.          DATE OF BIRTH:  Sep 10, 1928   DATE OF PROCEDURE:  04/29/2002  DATE OF DISCHARGE:                                 OPERATIVE REPORT   PREOPERATIVE DIAGNOSES:  Chronic cholecystitis.   POSTOPERATIVE DIAGNOSES:  Chronic cholecystitis.   OPERATION:  Laparoscopic cholecystectomy with cholangiogram.   SURGEON:  Anselm Pancoast. Zachery Dakins, M.D.   ASSISTANT:  Thornton Park. Daphine Deutscher, M.D.   HISTORY:  Wanda Wright is a 77 year old Caucasian female who was referred to  me for symptomatic gallstones. The patient states that about 25 years ago,  she had a laparoscopic Nissen fundoplication and has really not had problems  with epigastric pain and etc. since until recently. She had a symptomatic  attack two times, episodes of pain over a 24 hour period, reported this to  Dr. Tawanna Cooler, her regular physician, who recommended that an ultrasound to be  obtained and this showed numerous gallstones. She had no laboratory studies  performed but was referred to me and on examination she was not acutely  tender. She did have a midline incision from a previous Nissen  fundoplication but hopefully we can do it with a laparoscope. Preoperative  laboratory studies were obtained and these showed normal liver function  studies and a normal CBC. The patient was given preoperatively 3 gm of  Unasyn, has PAS stockings and taken to the operative suite.   DESCRIPTION OF PROCEDURE:  The induction of general anesthesia, the  endotracheal tube, oral tube into the stomach and then the abdomen was  prepped with Betadine surgical scrub solution and draped in a sterile  manner. A small incision made below the umbilicus, sharp dissection down  through the fascia, this was opened  and then the underlying peritoneum was  carefully dissected and then carefully entered. Fortunately, she did not  have significant adhesions once within the peritoneal cavity.  A pursestring  suture of #0 Vicryl was placed and Hasson cannula introduced and then the  camera inserted. In inspecting the upper abdomen, she basically has very  little adhesions, a little bit over in the right upper quadrant and then  appears that she has probably had a previous appendectomy and she has had a  GYN procedure also. The upper 10 mm trocar was placed through the old  midline incision and subxiphoid area and the two lateral 5 mm trocars were  placed by Dr. Daphine Deutscher in the appropriate lateral position. The gallbladder  was grasped upward and outward, there were a few adhesions around the  proximal portion of the gallbladder that were carefully taken down and then  the peritoneum was opened. The cystic duct junction with the gallbladder was  identified and the cystic artery could be identified. A clip was placed in  the cystic artery, a clip at the junction of the cystic duct, gallbladder  and then a small opening made and a Cook catheter introduced and an x-ray  was obtained. It showed that we have got about a 2 cm cystic duct, normal  size common bile duct with good flow into the duodenum and there was a  little reflux of the pancreatic duct. The intrahepatic radicles filled  nicely. The catheter was removed and the cystic duct was triply clipped and  then divided. The cystic artery was doubly clipped proximally, singly  distally and divided and then the gallbladder was freed from its bed using  the hook electrocautery. I placed the gallbladder within the EndoCatch bag  and then brought the bag up to the umbilicus and it was necessary to kind of  partially decompress the gallbladder before it would come through the  fascial incision without enlarging the incision. A second figure-of-eight of  #0 Vicryl  was placed at the umbilicus and the umbilicus fascia was  anesthetized also. The reinspection there was no evidence of any bleeding,  the irrigating fluid had been aspirated and the two lateral 5 mm trocars  were withdrawn under direct vision and then the carbon dioxide was released  and the upper 10 mm trocar withdrawn. The subcutaneous wounds were closed  with 4-0 Vicryl and Benzoin and Steri-Strips on the skin. The patient  tolerated the procedure nicely and should be able to resume her regular diet  and should be able to be discharged in the a.m.                                               Anselm Pancoast. Zachery Dakins, M.D.    WJW/MEDQ  D:  04/29/2002  T:  04/30/2002  Job:  [id number]   cc:   Tinnie Gens A. Tawanna Cooler, M.D. [facility]

## 2010-12-23 NOTE — Cardiovascular Report (Signed)
NAME:  Wanda Wright, Wanda Wright NO.:  [id number]   MEDICAL RECORD NO.:  [id number]                   PATIENT TYPE:  INP   LOCATION:  2316                                 FACILITY:  [facility]   PHYSICIAN:  Salvadore Farber, M.D. [facility]         DATE OF BIRTH:  Sep 22, 1928   DATE OF PROCEDURE:  09/05/2002  DATE OF DISCHARGE:                              CARDIAC CATHETERIZATION   PROCEDURES:  1. Coronary angiography.  2. Intra-aortic balloon pump placement.   CARDIOLOGIST:  Salvadore Farber, M.D.   INDICATIONS:  The patient is a 77 year old lady without prior history of  cardiac disease.  She has had intermittent chest discomfort over the past  days culminating in a prolonged episode today.  She presented to the  emergency room with 1/10 chest discomfort.  Troponin was elevated at 0.78.  She was referred for urgent diagnostic angiography.   DIAGNOSTIC TECHNIQUE:  Informed consent was obtained.  Under 1% lidocaine  local anesthesia, a 6 French sheath was placed in the right femoral artery  using the modified Seldinger technique.  Diagnostic angiography was  performed using JL4 and JR4 catheters.  Left ventriculography was deferred  to the severe left main stenosis with compromised flow to the LAD.  A 34 cm  intra-aortic balloon pump was placed via the preexisting right femoral  arterial axis.  Counter fixation was begun.  The patient will be transferred  directly from the cardiac catheterization lab to the operating suite.   COMPLICATIONS:  None.   FINDINGS:  1. Left main:  There is a 99% stenosis of the distal left main with TIMI-2     flow to the left anterior descending.  2. Left anterior descending:  The LAD is a moderate sized vessel giving rise     to two diagonal branches.  In limited views, there is no significant     disease of the vessel.  3. Circumflex: The circumflex appears to be occluded at its ostium.  4. Right coronary artery:  The RCA is a large  common dominant vessel giving     large to a large posterior left ventricular branch that supplies much of     the posterior lateral wall.  It is angiographically normal.    IMPRESSION/PLAN:  She has severe stenosis of the left main coronary with  TIMI-2 flow to the LAD and probable occlusion of the circumflex ostium.  The  case was discussed with Dr. Cornelius Moras with the patient on the catheterization  table.  He is preparing to take her to emergent coronary artery bypass  grafting.                                               Salvadore Farber, M.D. [facility]    WED/MEDQ  D:  09/05/2002  T:  09/06/2002  Job:  [id number]   cc:   Willa Rough, M.D. [facility] A. Tawanna Cooler, M.D. [facility]  [address] [place]  [place] [address]  Fax: 1

## 2010-12-23 NOTE — Discharge Summary (Signed)
NAME:  Wanda Wright, Wanda Wright NO.:  [id number]   MEDICAL RECORD NO.:  [id number]                   PATIENT TYPE:  INP   LOCATION:  2035                                 FACILITY:  [facility]   PHYSICIAN:  Salvatore Decent. Cornelius Moras, M.D.              DATE OF BIRTH:  03-19-1929   DATE OF ADMISSION:  DATE OF DISCHARGE:  09/09/2002                                 DISCHARGE SUMMARY   PHYSICIANS:  1. Cardiology Willa Rough, M.D. [facility].  2. Primary care physician Tinnie Gens A. Tawanna Cooler, M.D. [facility].   FINAL DIAGNOSES:  1. Critical left main disease with acute, evolving myocardial infarction.  2. Hypertension.  3. Hyperlipidemia.  4. Gastroesophageal reflux disease.   PROCEDURES:  1. Cardiac catheterization on September 05, 2002, with intra-aortic balloon     pump placement. No ejection fraction calculated.  2. Emergency coronary artery bypass graft x2 on September 05, 2002, with the     following grafts:  LIMA to the distal LAD and saphenous vein graft to the     circumflex marginal branch.   HISTORY OF PRESENT ILLNESS:  The patient was a 77 year old female with a  history of hypertension, followed by Dr. Tawanna Cooler, with no previous cardiac  history. She had a 2 week history of not feeling well with symptoms  including substernal chest pain. She was brought by EMS to the ER where she  was diagnosed with acute evolving MI with abnormal EKG showing diffuse ST  segment elevation as well as an initial set of positive cardiac enzymes.   She was seen by Dr. Myrtis Ser and promptly taken to the cardiac catheterization  laboratory by Dr. Randa Evens where she had a cardiac catheterization.  This showed critical left main coronary disease and 99% stenosis of the  distal left main. An intra-aortic balloon pump was placed. Dr. Cornelius Moras was  consulted.   She was taken for emergent CABG. She tolerated the procedure well.  Postoperatively there were no major complications. The intra-aortic balloon  pump was  discontinued. On postoperative day #1 she was transferred to unit  2000 on September 07, 2002.   By September 09, 2002, postoperative day #4, she was doing very well. She was  afebrile. Vital signs were stable, blood pressure 100/59, heart rate 77,  normal sinus rhythm. She was still a little bit volume overloaded around 10  pounds. The physical examination was otherwise satisfactory. The wounds were  healing well. She was discharged to home in stable condition.   DISCHARGE MEDICATIONS:  1. Enteric coated aspirin 325 mg daily.  2. Lasix 40 mg daily x3 days.  3. Potassium chloride 20 mEq daily x3 days.  4. Lopressor 25 mg p.o. q.12h.  5. Altace 1.25 mg p.o. b.i.d.  6. Ultram 50 to 100 mg p.o. q.4-6h. p.r.n. pain.  7. Multivitamin with iron 1 daily.   DISCHARGE INSTRUCTIONS:  She was told to  avoid driving, heavy lifting,  strenuous activity, working. She was told to walk daily, use her incentive  spirometer daily. She could shower, cleaning her wounds gently daily with  soap and water. She was told to get a chest x-ray when she saw her  cardiologist and to bring it with her to see Dr. Cornelius Moras.   CONDITION ON DISCHARGE:  Stable.   DISPOSITION:  Discharged to home.   FOLLOW UP:  1. Dr. Myrtis Ser on September 22, 2002, at 11:15 a.m.  2. Dr. Cornelius Moras on Monday, September 29, 2002, at 1:15 p.m.     Wanda Wright, P.A.                          Salvatore Decent. Cornelius Moras, M.D.    Alwyn Ren  D:  11/05/2002  T:  11/06/2002  Job:  [id number]   cc:   Tinnie Gens A. Tawanna Cooler, M.D. [facility]   Willa Rough, M.D. [facility]

## 2010-12-23 NOTE — Assessment & Plan Note (Signed)
[facility]                              CARDIOLOGY OFFICE NOTE   Wanda Wright, Wanda Wright                     MRN:          [id number]  DATE:03/13/2006                            DOB:          01/13/1929    Wanda Wright is doing well.  She underwent CABG in January, 2004.  She has not  had any shortness of breath or significant chest pain since that time.  She  feels some fatigue.  This does not sound cardiac in origin.  She has had no  syncope or presyncope.   PAST MEDICAL HISTORY:  1.  Allergies to CODEINE.  2.  Medications:  Vitamins, simvastatin, calcium, aspirin, atenolol,      hydrochlorothiazide.  3.  Other medical problems:  See the list below.   REVIEW OF SYSTEMS:  As mentioned, she has some fatigue.  She has some rare  back discomfort, otherwise her review of systems is negative.   PHYSICAL EXAMINATION:  VITAL SIGNS:  Blood pressure is 135/76 with a pulse  of 57.  LUNGS:  Clear.  Respiratory effort is not labored.  GENERAL:  Patient is alert and oriented to person, time, and place.  Her  affect is normal.  HEENT:  No xanthelasma.  She has normal extraocular motion.  NECK:  There are no carotid bruits.  There is no jugular venous distention.  CARDIAC:  S1 with an S2.  There are no clicks or significant murmurs.  ABDOMEN:  Soft.  There are no masses or bruits.  EXTREMITIES:  There is no significant peripheral edema.  There are no major  musculoskeletal deformities.   EKG reveals no significant change.   PROBLEMS:  1.  Hypertension, treated.  2.  History of colon polyps.  3.  Hyperlipidemia.  She is on medications.  She is to have follow-up labs      with Dr. Tawanna Cooler.  We would want her LDL to be in the 70-80 range.  4.  History of fibrocystic breast disease.  5.  Post coronary artery bypass graft in January, 2004.  6.  History of elevated hemidiaphragm.  7.  Altace cough.  8.  Abnormal chest x-ray.  The patient has informed me of this,  and I know      that she has followup planned.   PLAN:  Cardiac is stable.  I will see her back in one year.                               Luis Abed, MD, [facility]    JDK/MedQ  DD:  03/13/2006  DT:  03/13/2006  Job #:  [id number]   cc:   Tinnie Gens A. Tawanna Cooler, MD

## 2011-02-03 ENCOUNTER — Other Ambulatory Visit: Payer: Self-pay | Admitting: Dermatology

## 2011-02-10 ENCOUNTER — Telehealth: Payer: Self-pay | Admitting: Family Medicine

## 2011-02-10 ENCOUNTER — Ambulatory Visit (INDEPENDENT_AMBULATORY_CARE_PROVIDER_SITE_OTHER): Payer: Medicare Other | Admitting: Family Medicine

## 2011-02-10 ENCOUNTER — Emergency Department (HOSPITAL_BASED_OUTPATIENT_CLINIC_OR_DEPARTMENT_OTHER)
Admission: EM | Admit: 2011-02-10 | Discharge: 2011-02-10 | Disposition: A | Discharge status: Home / Self Care | Payer: Medicare Other | Attending: Emergency Medicine | Admitting: Emergency Medicine

## 2011-02-10 ENCOUNTER — Encounter: Payer: Self-pay | Admitting: Family Medicine

## 2011-02-10 DIAGNOSIS — Z79899 Other long term (current) drug therapy: Secondary | ICD-10-CM | POA: Insufficient documentation

## 2011-02-10 DIAGNOSIS — T63461A Toxic effect of venom of wasps, accidental (unintentional), initial encounter: Secondary | ICD-10-CM | POA: Insufficient documentation

## 2011-02-10 DIAGNOSIS — R413 Other amnesia: Secondary | ICD-10-CM

## 2011-02-10 DIAGNOSIS — Z951 Presence of aortocoronary bypass graft: Secondary | ICD-10-CM | POA: Insufficient documentation

## 2011-02-10 DIAGNOSIS — I10 Essential (primary) hypertension: Secondary | ICD-10-CM | POA: Insufficient documentation

## 2011-02-10 DIAGNOSIS — I119 Hypertensive heart disease without heart failure: Secondary | ICD-10-CM

## 2011-02-10 DIAGNOSIS — T6391XA Toxic effect of contact with unspecified venomous animal, accidental (unintentional), initial encounter: Secondary | ICD-10-CM | POA: Insufficient documentation

## 2011-02-10 DIAGNOSIS — I2510 Atherosclerotic heart disease of native coronary artery without angina pectoris: Secondary | ICD-10-CM | POA: Insufficient documentation

## 2011-02-10 DIAGNOSIS — E782 Mixed hyperlipidemia: Secondary | ICD-10-CM

## 2011-02-10 LAB — LIPID PANEL
Cholesterol: 160 mg/dL (ref 0–200)
HDL: 70.1 mg/dL (ref >39.00)
Triglycerides: 117 mg/dL (ref 0.0–149.0)
VLDL: 23.4 mg/dL (ref 0.0–40.0)

## 2011-02-10 LAB — BASIC METABOLIC PANEL
BUN: 15 mg/dL (ref 6–23)
GFR: 60.17 mL/min (ref >60.00)
Glucose, Bld: 99 mg/dL (ref 70–99)
Potassium: 3.4 mEq/L — ABNORMAL LOW (ref 3.5–5.1)

## 2011-02-10 LAB — HEPATIC FUNCTION PANEL
AST: 25 U/L (ref 0–37)
Albumin: 4.2 g/dL (ref 3.5–5.2)

## 2011-02-10 MED ORDER — DONEPEZIL HCL 5 MG PO TABS: 5.0000 mg | ORAL_TABLET | Freq: Every day | ORAL | Status: DC

## 2011-02-10 NOTE — Patient Instructions (Signed)
Follow up in 1 month to recheck memory Start the Aricept daily We'll notify you of your lab results Call with any questions or concerns Hang in there!

## 2011-02-10 NOTE — Progress Notes (Signed)
  Subjective:    Patient ID: Wanda Wright, female    DOB: August 23, 1928, 77 y.o.   MRN: [id number]  HPI HTN- chronic problem for pt, excellent control on tenoretic.  No CP, SOB, HAs, visual changes, edema  Hyperlipidemia- chronic problem for pt, currently on Lipitor.  No abd pain, N/V, myalgias  Memory loss- pt reports this has been a slow decline over the last few months.  Has difficulty answering questions when asked but will get to the answer after the fact.  Denies getting lost.  Has difficulty w/ names but doesn't have trouble w/ those close to her.  Not currently interested in seeing neuro, but 'isn't there a medicine to help me'.   Review of Systems For ROS see HPI     Objective:   Physical Exam  Constitutional: She is oriented to person, place, and time. She appears well-developed and well-nourished. No distress.  HENT:  Head: Normocephalic and atraumatic.  Eyes: Conjunctivae and EOM are normal. Pupils are equal, round, and reactive to light.  Neck: Normal range of motion. Neck supple. No thyromegaly present.  Cardiovascular: Normal rate, regular rhythm, normal heart sounds and intact distal pulses.   No murmur heard. Pulmonary/Chest: Effort normal and breath sounds normal. No respiratory distress.  Abdominal: Soft. She exhibits no distension. There is no tenderness.  Musculoskeletal: She exhibits no edema.  Lymphadenopathy:    She has no cervical adenopathy.  Neurological: She is alert and oriented to person, place, and time.  Skin: Skin is warm and dry.  Psychiatric: She has a normal mood and affect. Her behavior is normal.          Assessment & Plan:

## 2011-02-10 NOTE — Assessment & Plan Note (Signed)
Tolerating statin w/out difficulty.  Check labs.  Adjust meds prn. 

## 2011-02-10 NOTE — Assessment & Plan Note (Signed)
Pt's BP is excellently controlled today.  Asymptomatic.  No changes.  Check labs.

## 2011-02-10 NOTE — Telephone Encounter (Signed)
Spoke w/ pt says she was stung by yellow jackets and arm is now painful and starting to swell.  Informed pt that she should take some benadryl and offered appt for sat. clinic wanted to come back to office to see Dr. Beverely Low since she already had appt today informed her that we don't have any appts available and if she felt as if she couldn't wait until tomorrow to go to urgent care today pt stated that she would go to urgent clinic today.

## 2011-02-10 NOTE — Assessment & Plan Note (Signed)
Pt unwilling to see neuro for complete w/u.  Will check labs.  Start Aricept to see if we can slow her rate of decline.  Will follow closely.

## 2011-02-17 ENCOUNTER — Ambulatory Visit: Payer: No Typology Code available for payment source

## 2011-02-17 ENCOUNTER — Ambulatory Visit (INDEPENDENT_AMBULATORY_CARE_PROVIDER_SITE_OTHER): Payer: Medicare Other | Admitting: *Deleted

## 2011-02-17 DIAGNOSIS — E538 Deficiency of other specified B group vitamins: Secondary | ICD-10-CM

## 2011-02-17 MED ORDER — CYANOCOBALAMIN 1000 MCG/ML IJ SOLN
1000.0000 ug | Freq: Once | INTRAMUSCULAR | Status: AC
  Administered 2011-02-17: 1000 ug via INTRAMUSCULAR

## 2011-02-24 ENCOUNTER — Ambulatory Visit (INDEPENDENT_AMBULATORY_CARE_PROVIDER_SITE_OTHER): Payer: Medicare Other | Admitting: *Deleted

## 2011-02-24 DIAGNOSIS — E538 Deficiency of other specified B group vitamins: Secondary | ICD-10-CM

## 2011-02-24 MED ORDER — CYANOCOBALAMIN 1000 MCG/ML IJ SOLN
1000.0000 ug | Freq: Once | INTRAMUSCULAR | Status: AC
  Administered 2011-02-24: 1000 ug via INTRAMUSCULAR

## 2011-03-02 ENCOUNTER — Ambulatory Visit (INDEPENDENT_AMBULATORY_CARE_PROVIDER_SITE_OTHER): Payer: Medicare Other | Admitting: *Deleted

## 2011-03-02 DIAGNOSIS — E538 Deficiency of other specified B group vitamins: Secondary | ICD-10-CM

## 2011-03-02 MED ORDER — CYANOCOBALAMIN 1000 MCG/ML IJ SOLN
1000.0000 ug | Freq: Once | INTRAMUSCULAR | Status: AC
  Administered 2011-03-02: 1000 ug via INTRAMUSCULAR

## 2011-03-10 ENCOUNTER — Ambulatory Visit (INDEPENDENT_AMBULATORY_CARE_PROVIDER_SITE_OTHER): Payer: Medicare Other | Admitting: *Deleted

## 2011-03-10 DIAGNOSIS — E538 Deficiency of other specified B group vitamins: Secondary | ICD-10-CM

## 2011-03-10 MED ORDER — CYANOCOBALAMIN 1000 MCG/ML IJ SOLN
1000.0000 ug | Freq: Once | INTRAMUSCULAR | Status: AC
  Administered 2011-03-10: 1000 ug via INTRAMUSCULAR

## 2011-03-13 ENCOUNTER — Ambulatory Visit: Payer: No Typology Code available for payment source | Admitting: Family Medicine

## 2011-03-20 ENCOUNTER — Telehealth: Payer: Self-pay | Admitting: *Deleted

## 2011-03-20 NOTE — Telephone Encounter (Signed)
Discuss with patient  

## 2011-03-20 NOTE — Telephone Encounter (Signed)
Pt c/o constipation x 6 month. Pt notes that she has tired mira lax with no relief but [employer] chocolate laxative pieces does seem to do the job.. Pt would like to know what else she can take for constipation.

## 2011-03-20 NOTE — Telephone Encounter (Signed)
If the [employer] laxative is working she can continue to use this as needed.  Would add OTC stool softener BID and make sure she is drinking plenty of water and her diet is high in fiber

## 2011-03-20 NOTE — Telephone Encounter (Signed)
Pt left VM that she has some concerns about constipation and would like call back to discuss. Left message to call office.

## 2011-04-03 ENCOUNTER — Ambulatory Visit: Payer: No Typology Code available for payment source | Admitting: Family Medicine

## 2011-04-03 ENCOUNTER — Ambulatory Visit (INDEPENDENT_AMBULATORY_CARE_PROVIDER_SITE_OTHER): Payer: Medicare Other | Admitting: Family Medicine

## 2011-04-03 DIAGNOSIS — R413 Other amnesia: Secondary | ICD-10-CM

## 2011-04-03 DIAGNOSIS — J309 Allergic rhinitis, unspecified: Secondary | ICD-10-CM

## 2011-04-03 DIAGNOSIS — K5900 Constipation, unspecified: Secondary | ICD-10-CM

## 2011-04-03 MED ORDER — DONEPEZIL HCL 10 MG PO TABS: 10.0000 mg | ORAL_TABLET | Freq: Every day | ORAL | Status: DC

## 2011-04-03 NOTE — Progress Notes (Signed)
  Subjective:    Patient ID: Wanda Wright, female    DOB: April 18, 1929, 77 y.o.   MRN: [id number]  HPI Memory loss- started Aricept 1 month ago.  Reports she doesn't notice a difference since starting the med.  Feels memory is worse.  Mother had dementia.    Nasal congestion- reports clear nasal drainage.  sxs started 2 weeks ago.  Having associated watery eyes.  No fevers.  Not using Flonase.  Constipation- chronic problem for pt, not using Miralax 'it didn't work'.  Wasn't using daily.  Has no urge to go to the bathroom.  Using [employer] brand laxative w/ good results.   Review of Systems For ROS see HPI     Objective:   Physical Exam  Vitals reviewed. Constitutional: She is oriented to person, place, and time. She appears well-developed and well-nourished. No distress.  HENT:  Head: Normocephalic and atraumatic.       TMs normal bilaterally Nasal turbinate edema + PND  Eyes: Conjunctivae and EOM are normal. Pupils are equal, round, and reactive to light.  Neck: Normal range of motion. Neck supple.  Cardiovascular: Normal rate, regular rhythm, normal heart sounds and intact distal pulses.   Pulmonary/Chest: Effort normal and breath sounds normal. No respiratory distress. She has no wheezes. She has no rales.  Abdominal: Soft. Bowel sounds are normal. She exhibits no distension. There is no tenderness. There is no rebound.  Musculoskeletal: She exhibits no edema.  Lymphadenopathy:    She has no cervical adenopathy.  Neurological: She is alert and oriented to person, place, and time. No cranial nerve deficit. Coordination normal.  Psychiatric: She has a normal mood and affect. Her behavior is normal. Thought content normal.          Assessment & Plan:

## 2011-04-03 NOTE — Patient Instructions (Signed)
Follow up in 2 months to recheck memory Increase to Aricept 10mg  Restart your Flonase for the allergies Continue to use the laxatives as needed Call with any questions or concerns Hang in there!

## 2011-04-06 DIAGNOSIS — K5900 Constipation, unspecified: Secondary | ICD-10-CM | POA: Insufficient documentation

## 2011-04-06 NOTE — Assessment & Plan Note (Signed)
Pt reports no difference in memory at this time.  No obvious deficits when talking to pt.  Will increase Aricept to 10mg  and continue to follow.  Pt has never had neuro w/u for memory issues.  Not interested at this time.

## 2011-04-06 NOTE — Assessment & Plan Note (Signed)
Pt to use nasal steroid daily to control sxs.  Pt expressed understanding and is in agreement w/ plan.

## 2011-04-06 NOTE — Assessment & Plan Note (Signed)
Pt was unhappy w/ miralax but was not using it daily as recommended.  Prefers store brand laxative.  Pt to continue prn.

## 2011-04-11 ENCOUNTER — Ambulatory Visit (INDEPENDENT_AMBULATORY_CARE_PROVIDER_SITE_OTHER): Payer: Medicare Other | Admitting: *Deleted

## 2011-04-11 DIAGNOSIS — E538 Deficiency of other specified B group vitamins: Secondary | ICD-10-CM

## 2011-04-11 MED ORDER — CYANOCOBALAMIN 1000 MCG/ML IJ SOLN
1000.0000 ug | Freq: Once | INTRAMUSCULAR | Status: AC
  Administered 2011-04-11: 1000 ug via INTRAMUSCULAR

## 2011-04-12 ENCOUNTER — Encounter: Payer: Self-pay | Admitting: Family Medicine

## 2011-04-12 ENCOUNTER — Ambulatory Visit (INDEPENDENT_AMBULATORY_CARE_PROVIDER_SITE_OTHER): Payer: Medicare Other | Admitting: Family Medicine

## 2011-04-12 DIAGNOSIS — IMO0001 Reserved for inherently not codable concepts without codable children: Secondary | ICD-10-CM

## 2011-04-12 DIAGNOSIS — R252 Cramp and spasm: Secondary | ICD-10-CM

## 2011-04-12 DIAGNOSIS — R61 Generalized hyperhidrosis: Secondary | ICD-10-CM

## 2011-04-12 DIAGNOSIS — R232 Flushing: Secondary | ICD-10-CM | POA: Insufficient documentation

## 2011-04-12 NOTE — Patient Instructions (Signed)
We'll notify you of your lab results Increase the potassium in your diet- bananas, citrus fruits, leafy greens, dried apricots Drink plenty of fluids Your wet and sticky feeling at night is due to sweating- make sure you are not too bundled while sleeping.  Sweating will also worsen your cramping Call with any questions or concerns Hang in there!

## 2011-04-12 NOTE — Progress Notes (Signed)
  Subjective:    Patient ID: Wanda Wright, female    DOB: 1928/09/30, 77 y.o.   MRN: [id number]  HPI Woke up at 1 am on Monday and both arms were wet and 'sticky'.  Does not have similar sxs in legs, feet.  Admits she was 'bundled up' when she had the wet and sticky feeling.  Red face- daughter reports pt's face was bright red on Sunday and Monday, 'i thought she was sunburned'.  Denies pain in face.  Denies new or different medicines.  Night time leg cramps- sxs started 1 month ago.  Labs in July showed mildly low K+.  Denies similar sxs during the day.   Review of Systems For ROS see HPI     Objective:   Physical Exam  Vitals reviewed. Constitutional: She is oriented to person, place, and time. She appears well-developed and well-nourished. No distress.  HENT:  Head: Normocephalic and atraumatic.       Face is normal in color today  Eyes: Conjunctivae and EOM are normal. Pupils are equal, round, and reactive to light.  Neck: Neck supple. No thyromegaly present.  Cardiovascular: Normal rate, regular rhythm, normal heart sounds and intact distal pulses.   Pulmonary/Chest: Effort normal and breath sounds normal. No respiratory distress. She has no wheezes. She has no rales.  Musculoskeletal: She exhibits no edema and no tenderness.  Lymphadenopathy:    She has no cervical adenopathy.  Neurological: She is alert and oriented to person, place, and time. No cranial nerve deficit. Coordination normal.  Skin: Skin is warm and dry. No rash noted. She is not diaphoretic. No erythema. No pallor.          Assessment & Plan:

## 2011-04-13 ENCOUNTER — Telehealth: Payer: Self-pay

## 2011-04-13 LAB — BASIC METABOLIC PANEL
GFR: 64.85 mL/min (ref >60.00)
Potassium: 3.4 mEq/L — ABNORMAL LOW (ref 3.5–5.1)
Sodium: 141 mEq/L (ref 135–145)

## 2011-04-13 MED ORDER — POTASSIUM CHLORIDE ER 10 MEQ PO TBCR: 10.0000 meq | EXTENDED_RELEASE_TABLET | Freq: Every day | ORAL | Status: DC

## 2011-04-13 NOTE — Telephone Encounter (Signed)
Message copied by Beverely Low on Thu Apr 13, 2011  3:01 PM ------      Message from: Sheliah Hatch      Created: Thu Apr 13, 2011  2:25 PM       K+ is low- this is most likely the cause of pt's leg cramps.  Should increase the K+ in her diet and start K+ daily.

## 2011-04-13 NOTE — Telephone Encounter (Signed)
Pt aware and Rx sent.  

## 2011-04-20 ENCOUNTER — Encounter: Payer: Self-pay | Admitting: Family Medicine

## 2011-04-20 NOTE — Assessment & Plan Note (Signed)
Unclear as to what would have caused pt's red face- no new meds, no recent exposure to different detergents or products.  Face is normal in color and appearance today.  Pt to call if sxs persist.

## 2011-04-20 NOTE — Assessment & Plan Note (Signed)
Pt's 'wet, sticky' skin is due to her sweating at night.  Pt admits to being bundled when sleeping and daughter says 'i'm not sure how she sleeps under all of that!'.  Recommended she wear less clothing to bed or use less blankets to prevent sweating.  Will follow.

## 2011-04-20 NOTE — Assessment & Plan Note (Signed)
Most likely due to a combination of dehydration and low K+.  Check labs.  Reviewed foods that are high in K+.  Encouraged increased fluid intake.  Will supplement w/ oral K+ prn.  Pt expressed understanding and is in agreement w/ plan.

## 2011-05-02 LAB — COMPREHENSIVE METABOLIC PANEL
AST: 40 — ABNORMAL HIGH
Albumin: 3.7
BUN: 13
Calcium: 9.3
Chloride: 105
Creatinine, Ser: 0.8
GFR calc Af Amer: >60
Total Bilirubin: 1.5 — ABNORMAL HIGH

## 2011-05-02 LAB — CBC
Hemoglobin: 14
MCHC: 33.6
MCV: 88.1
Platelets: 155
Platelets: 168
RBC: 4.77
WBC: 4.2

## 2011-05-02 LAB — CK TOTAL AND CKMB (NOT AT ARMC)
CK, MB: 1.2
CK, MB: 1.5
CK, MB: 2.4
Relative Index: 1.8
Relative Index: INVALID
Total CK: 137
Total CK: 73

## 2011-05-02 LAB — DIFFERENTIAL
Basophils Absolute: 0.1
Lymphocytes Relative: 22
Lymphs Abs: 1.4
Monocytes Absolute: 0.5
Neutro Abs: 4.2

## 2011-05-02 LAB — BASIC METABOLIC PANEL
BUN: 8
Calcium: 8.2 — ABNORMAL LOW
Creatinine, Ser: 0.79
GFR calc non Af Amer: >60

## 2011-05-02 LAB — B-NATRIURETIC PEPTIDE (CONVERTED LAB): Pro B Natriuretic peptide (BNP): 101 — ABNORMAL HIGH

## 2011-05-02 LAB — TROPONIN I
Troponin I: 0.01
Troponin I: <0.01

## 2011-05-02 LAB — POCT CARDIAC MARKERS
CKMB, poc: 2.1
Operator id: 288331

## 2011-05-02 LAB — PROTIME-INR: Prothrombin Time: 12.1

## 2011-05-02 LAB — HEPARIN LEVEL (UNFRACTIONATED): Heparin Unfractionated: 0.77 — ABNORMAL HIGH

## 2011-05-02 LAB — APTT: aPTT: 30

## 2011-05-11 ENCOUNTER — Ambulatory Visit (INDEPENDENT_AMBULATORY_CARE_PROVIDER_SITE_OTHER): Payer: Medicare Other | Admitting: *Deleted

## 2011-05-11 VITALS — Temp 97.8°F

## 2011-05-11 DIAGNOSIS — E538 Deficiency of other specified B group vitamins: Secondary | ICD-10-CM

## 2011-05-11 DIAGNOSIS — Z23 Encounter for immunization: Secondary | ICD-10-CM

## 2011-05-11 MED ORDER — CYANOCOBALAMIN 1000 MCG/ML IJ SOLN
1000.0000 ug | Freq: Once | INTRAMUSCULAR | Status: AC
  Administered 2011-05-11: 1000 ug via INTRAMUSCULAR

## 2011-05-31 ENCOUNTER — Telehealth: Payer: Self-pay | Admitting: Cardiology

## 2011-05-31 NOTE — Telephone Encounter (Signed)
All Cardiac faxed to [facility] @ [phone]  05/31/11/km

## 2011-06-05 ENCOUNTER — Encounter: Payer: Self-pay | Admitting: Family Medicine

## 2011-06-05 ENCOUNTER — Ambulatory Visit (INDEPENDENT_AMBULATORY_CARE_PROVIDER_SITE_OTHER): Payer: Medicare Other | Admitting: Family Medicine

## 2011-06-05 DIAGNOSIS — R413 Other amnesia: Secondary | ICD-10-CM

## 2011-06-05 NOTE — Patient Instructions (Signed)
Schedule your complete physical after January 4th- don't eat before this appt You look great!  Keep up the good work! If you feel you want to see neuro for the memory- call me! Call with any questions or concerns Happy Holidays!!!

## 2011-06-05 NOTE — Progress Notes (Signed)
  Subjective:    Patient ID: Wanda Wright, female    DOB: 1929/02/04, 77 y.o.   MRN: [id number]  HPI Memory loss- increased Aricept at last visit.  Feels this has 'helped some'.  Doesn't feel she's been on the medicine long enough to make a judgement.  Still not sure she wants to see Neuro.  Pt has not asked for family feedback on the issue.   Review of Systems For ROS see HPI     Objective:   Physical Exam  Vitals reviewed. Constitutional: She appears well-developed and well-nourished. No distress.  Psychiatric: She has a normal mood and affect. Her behavior is normal. Judgment and thought content normal.          Assessment & Plan:

## 2011-06-06 NOTE — Assessment & Plan Note (Signed)
Pt feels that sxs may be improving w/ increased dose of aricept.  Family has not had increased complaints- encouraged her to ask for feedback.  She is not interested in seeing neuro at this time.  Will continue to follow.

## 2011-06-12 ENCOUNTER — Ambulatory Visit (INDEPENDENT_AMBULATORY_CARE_PROVIDER_SITE_OTHER): Payer: Medicare Other

## 2011-06-12 DIAGNOSIS — E538 Deficiency of other specified B group vitamins: Secondary | ICD-10-CM

## 2011-06-12 MED ORDER — CYANOCOBALAMIN 1000 MCG/ML IJ SOLN
1000.0000 ug | Freq: Once | INTRAMUSCULAR | Status: AC
  Administered 2011-06-12: 1000 ug via INTRAMUSCULAR

## 2011-06-22 ENCOUNTER — Telehealth: Payer: Self-pay | Admitting: Cardiology

## 2011-06-22 NOTE — Telephone Encounter (Signed)
New problem Pt is having eye surgery and she is supposed to go off aspirin for 6 days Please call her back

## 2011-06-22 NOTE — Telephone Encounter (Signed)
SPOKE WITH PT IS HAVING EYELID SX NEEDS TO HOLD ASA 6 DAYS PRIOR TO SURGERY.PT AWARE WILL DISCUSS WITH DR Myrtis Ser.PER DR KATZ OKAY TO HOLD ASA 6 DAYS PRIOR TO SURGERY BUT NEEDS  TO RESUME ASA ASAP   WITHIN  1-2 DAYS  AFTER.  PT AWARE OF ABOVE .

## 2011-06-27 ENCOUNTER — Telehealth: Payer: Self-pay | Admitting: Cardiology

## 2011-06-27 NOTE — Telephone Encounter (Signed)
FU Call: Pt said she called a week ago and spoke with someone regarding if pt needs to stop taking aspirin 6 days or 8 days prior to surgery.Please call Dr. Charlotte Sanes [phone] and discuss when pt should stop taking aspirin.  Please return pt call to discuss further.

## 2011-06-27 NOTE — Telephone Encounter (Signed)
Fu previous call She called back and said she was to stop aspirin for 10 days instead of 8

## 2011-06-27 NOTE — Telephone Encounter (Signed)
Ok per Dr Myrtis Ser.  Pt was notified.

## 2011-07-12 ENCOUNTER — Other Ambulatory Visit: Payer: Self-pay | Admitting: Family Medicine

## 2011-07-12 ENCOUNTER — Ambulatory Visit: Payer: No Typology Code available for payment source

## 2011-07-12 DIAGNOSIS — Z1231 Encounter for screening mammogram for malignant neoplasm of breast: Secondary | ICD-10-CM

## 2011-07-19 ENCOUNTER — Encounter: Payer: Self-pay | Admitting: Family Medicine

## 2011-07-19 ENCOUNTER — Ambulatory Visit (INDEPENDENT_AMBULATORY_CARE_PROVIDER_SITE_OTHER): Payer: Medicare Other | Admitting: Family Medicine

## 2011-07-19 VITALS — BP 122/75 | HR 90 | Temp 97.4°F | Ht 62.75 in | Wt 139.8 lb

## 2011-07-19 DIAGNOSIS — H6690 Otitis media, unspecified, unspecified ear: Secondary | ICD-10-CM | POA: Insufficient documentation

## 2011-07-19 MED ORDER — AMOXICILLIN 500 MG PO CAPS: 500.0000 mg | ORAL_CAPSULE | Freq: Two times a day (BID) | ORAL | Status: AC

## 2011-07-19 MED ORDER — CYANOCOBALAMIN 1000 MCG/ML IJ SOLN
1000.0000 ug | Freq: Once | INTRAMUSCULAR | Status: AC
  Administered 2011-07-19: 1000 ug via INTRAMUSCULAR

## 2011-07-19 NOTE — Assessment & Plan Note (Signed)
R OM.  Start abx.  Reviewed supportive care and red flags that should prompt return.  Pt expressed understanding and is in agreement w/ plan.  

## 2011-07-19 NOTE — Patient Instructions (Signed)
You have a R ear infection Start the Amoxicillin twice daily for 10 days- take w/ food Avoid spraying things in your ear to let it heal Call with any questions or concerns Happy Holidays!!

## 2011-07-19 NOTE — Progress Notes (Signed)
Addended by: Derry Lory A on: 07/19/2011 02:48 PM   Modules accepted: Orders

## 2011-07-19 NOTE — Progress Notes (Signed)
  Subjective:    Patient ID: Wanda Wright, female    DOB: 03/03/1929, 77 y.o.   MRN: [id number]  HPI Ear pain- Saturday morning had R ear pain.  Sprayed nasal spray into ear thinking this would help but this worsened pain.  Denies facial pain.  No fevers.  Mild nasal congestion.   Review of Systems For ROS see HPI     Objective:   Physical Exam  Vitals reviewed. Constitutional: She appears well-developed and well-nourished. No distress.  HENT:  Head: Normocephalic and atraumatic.  Nose: Nose normal.  Mouth/Throat: Oropharynx is clear and moist. No oropharyngeal exudate.       R TM erythematous, dull, poor landmarks L TM WNL  Neck: Normal range of motion. Neck supple.  Lymphadenopathy:    She has no cervical adenopathy.   R OM       Assessment & Plan:

## 2011-07-20 ENCOUNTER — Encounter: Payer: Self-pay | Admitting: Gastroenterology

## 2011-07-21 ENCOUNTER — Telehealth: Payer: Self-pay

## 2011-07-21 NOTE — Telephone Encounter (Signed)
She needs to continue her abx for the full 10 days.  Her ears will likely continue to feel full but this will improve.  She just needs to hang in there!

## 2011-07-21 NOTE — Telephone Encounter (Signed)
Discussed with patient and she voiced understanding.      KP 

## 2011-07-21 NOTE — Telephone Encounter (Signed)
Call from patient and she stated that she has taken the medication on Wednesday night and her ears still feels the same. It is not worst and no other symptoms but she wanted to know if there was something she needs to do to help relieve the pressure. Please advise    KP

## 2011-08-02 ENCOUNTER — Telehealth: Payer: Self-pay | Admitting: Family Medicine

## 2011-08-02 DIAGNOSIS — H9209 Otalgia, unspecified ear: Secondary | ICD-10-CM

## 2011-08-02 NOTE — Telephone Encounter (Signed)
Last OV 07/19/11.

## 2011-08-02 NOTE — Telephone Encounter (Signed)
Ok for ENT referral since this is what she wants

## 2011-08-02 NOTE — Telephone Encounter (Signed)
Spoke to pt to advise a referral was sent however some offices have been closed today however someone will call her asap with appt information Pt understood

## 2011-08-02 NOTE — Telephone Encounter (Signed)
Patients states that she was seen earlier in month for earache. She states that her ear is not feeling any better and would like to be referred to ENT.

## 2011-08-14 ENCOUNTER — Ambulatory Visit (INDEPENDENT_AMBULATORY_CARE_PROVIDER_SITE_OTHER): Payer: Medicare Other | Admitting: Family Medicine

## 2011-08-14 ENCOUNTER — Encounter: Payer: Self-pay | Admitting: Family Medicine

## 2011-08-14 DIAGNOSIS — M810 Age-related osteoporosis without current pathological fracture: Secondary | ICD-10-CM

## 2011-08-14 DIAGNOSIS — Z0000 Encounter for general adult medical examination without abnormal findings: Secondary | ICD-10-CM

## 2011-08-14 DIAGNOSIS — I119 Hypertensive heart disease without heart failure: Secondary | ICD-10-CM

## 2011-08-14 DIAGNOSIS — E782 Mixed hyperlipidemia: Secondary | ICD-10-CM

## 2011-08-14 LAB — CBC WITH DIFFERENTIAL/PLATELET
Basophils Relative: 0.5 % (ref 0.0–3.0)
Eosinophils Relative: 5.8 % — ABNORMAL HIGH (ref 0.0–5.0)
HCT: 38.9 % (ref 36.0–46.0)
Hemoglobin: 13.1 g/dL (ref 12.0–15.0)
Lymphs Abs: 1 10*3/uL (ref 0.7–4.0)
Monocytes Relative: 6.3 % (ref 3.0–12.0)
Platelets: 162 10*3/uL (ref 150.0–400.0)
RBC: 4.34 Mil/uL (ref 3.87–5.11)
WBC: 4.5 10*3/uL (ref 4.5–10.5)

## 2011-08-14 LAB — BASIC METABOLIC PANEL
CO2: 32 mEq/L (ref 19–32)
Calcium: 9.5 mg/dL (ref 8.4–10.5)
Glucose, Bld: 90 mg/dL (ref 70–99)
Sodium: 143 mEq/L (ref 135–145)

## 2011-08-14 LAB — LIPID PANEL
Total CHOL/HDL Ratio: 2
Triglycerides: 78 mg/dL (ref 0.0–149.0)

## 2011-08-14 MED ORDER — ATENOLOL-CHLORTHALIDONE 50-25 MG PO TABS: 1.0000 | ORAL_TABLET | ORAL | Status: DC

## 2011-08-14 MED ORDER — NITROGLYCERIN 0.4 MG SL SUBL: 0.4000 mg | SUBLINGUAL_TABLET | SUBLINGUAL | Status: DC | PRN

## 2011-08-14 NOTE — Progress Notes (Signed)
  Subjective:    Patient ID: Wanda Wright, female    DOB: Sep 13, 1928, 78 y.o.   MRN: [id number]  HPI Here today for CPE.  Risk Factors: Hyperlipidemia- chronic problem.  On Lipitor.  Denies abd pain, N/V, myalgias.  HTN- chronic problem, on tenoretic.  Well controlled.  No CP, SOB, HAs, edema.  Osteoporosis- cannot recall the last date of DEXA.  Overdue.  On Ca+ Vit D daily.  Can't recall if she has ever been on prescription meds.l  Physical Activity: not getting regular exercise Fall Risk: feels steady while walking but does not feel comfortable doing stairs or escalators Depression: sxs are well controlled Hearing: typically normal but w/ current middle ear effusion is decreased ADL's: independent Cognitive: some mild forgetfulness but not interfering w/ daily activities, normal linear thought process, attention intact Home Safety: feels safe at home but doesn't like being home alone at night (daughter lives w/ pt) Height, Weight, BMI, Visual Acuity: see vitals, vision corrected to 20/20 w/ glasses Counseling: UTD on colonoscopy, mammo Labs Ordered: See A&P Care Plan: See A&P    Review of Systems Patient reports no vision/ hearing changes, adenopathy,fever, weight change,  persistant/recurrent hoarseness , swallowing issues, chest pain, palpitations, edema, persistant/recurrent cough, hemoptysis, dyspnea (rest/exertional/paroxysmal nocturnal), gastrointestinal bleeding (melena, rectal bleeding), abdominal pain, significant heartburn, bowel changes, GU symptoms (dysuria, hematuria, incontinence), Gyn symptoms (abnormal  bleeding, pain),  syncope, focal weakness, memory loss, numbness & tingling, skin/hair/nail changes, abnormal bruising or bleeding, anxiety, or depression.     Objective:   Physical Exam General Appearance:    Alert, cooperative, no distress, appears stated age  Head:    Normocephalic, without obvious abnormality, atraumatic  Eyes:    PERRL, conjunctiva/corneas  clear, EOM's intact, fundi    benign, both eyes  Ears:    Normal TM's and external ear canals, both ears  Nose:   Nares normal, septum midline, mucosa normal, no drainage    or sinus tenderness  Throat:   Lips, mucosa, and tongue normal; teeth and gums normal  Neck:   Supple, symmetrical, trachea midline, no adenopathy;    Thyroid: no enlargement/tenderness/nodules  Back:     Symmetric, no curvature, ROM normal, no CVA tenderness  Lungs:     Clear to auscultation bilaterally, respirations unlabored  Chest Wall:    No tenderness or deformity   Heart:    Regular rate and rhythm, S1 and S2 normal, no murmur, rub   or gallop  Breast Exam:    Deferred- pt prefers to rely on mammo  Abdomen:     Soft, non-tender, bowel sounds active all four quadrants,    no masses, no organomegaly  Genitalia:    Deferred at pt's request  Rectal:    Extremities:   Extremities normal, atraumatic, no cyanosis or edema  Pulses:   2+ and symmetric all extremities  Skin:   Skin color, texture, turgor normal, no rashes or lesions  Lymph nodes:   Cervical, supraclavicular, and axillary nodes normal  Neurologic:   CNII-XII intact, normal strength, sensation and reflexes    throughout          Assessment & Plan:

## 2011-08-14 NOTE — Patient Instructions (Signed)
Follow up in 6 months to recheck blood pressure and cholesterol We'll notify you of your lab results and make any changes if needed Try and get regular exercise Someone will call you about your bone density appt Call with any questions or concerns Happy New Year!

## 2011-08-15 ENCOUNTER — Encounter: Payer: Self-pay | Admitting: *Deleted

## 2011-08-15 ENCOUNTER — Ambulatory Visit: Payer: No Typology Code available for payment source

## 2011-08-15 LAB — HEPATIC FUNCTION PANEL
AST: 22 U/L (ref 0–37)
Albumin: 4 g/dL (ref 3.5–5.2)
Alkaline Phosphatase: 81 U/L (ref 39–117)
Total Protein: 6.9 g/dL (ref 6.0–8.3)

## 2011-08-16 ENCOUNTER — Encounter: Payer: Self-pay | Admitting: Cardiology

## 2011-08-16 DIAGNOSIS — J986 Disorders of diaphragm: Secondary | ICD-10-CM | POA: Insufficient documentation

## 2011-08-16 DIAGNOSIS — Z951 Presence of aortocoronary bypass graft: Secondary | ICD-10-CM | POA: Insufficient documentation

## 2011-08-16 DIAGNOSIS — E785 Hyperlipidemia, unspecified: Secondary | ICD-10-CM | POA: Insufficient documentation

## 2011-08-16 DIAGNOSIS — I2510 Atherosclerotic heart disease of native coronary artery without angina pectoris: Secondary | ICD-10-CM | POA: Insufficient documentation

## 2011-08-16 DIAGNOSIS — R9430 Abnormal result of cardiovascular function study, unspecified: Secondary | ICD-10-CM | POA: Insufficient documentation

## 2011-08-16 DIAGNOSIS — I10 Essential (primary) hypertension: Secondary | ICD-10-CM | POA: Insufficient documentation

## 2011-08-16 DIAGNOSIS — I739 Peripheral vascular disease, unspecified: Secondary | ICD-10-CM

## 2011-08-16 LAB — VITAMIN D 1,25 DIHYDROXY
Vitamin D 1, 25 (OH)2 Total: 46 pg/mL (ref 18–72)
Vitamin D2 1, 25 (OH)2: <8 pg/mL

## 2011-08-17 ENCOUNTER — Ambulatory Visit (INDEPENDENT_AMBULATORY_CARE_PROVIDER_SITE_OTHER): Payer: Medicare Other | Admitting: Cardiology

## 2011-08-17 ENCOUNTER — Encounter: Payer: Self-pay | Admitting: Cardiology

## 2011-08-17 ENCOUNTER — Encounter: Payer: Self-pay | Admitting: *Deleted

## 2011-08-17 DIAGNOSIS — I779 Disorder of arteries and arterioles, unspecified: Secondary | ICD-10-CM

## 2011-08-17 DIAGNOSIS — I2510 Atherosclerotic heart disease of native coronary artery without angina pectoris: Secondary | ICD-10-CM

## 2011-08-17 DIAGNOSIS — R001 Bradycardia, unspecified: Secondary | ICD-10-CM

## 2011-08-17 DIAGNOSIS — I498 Other specified cardiac arrhythmias: Secondary | ICD-10-CM

## 2011-08-17 DIAGNOSIS — I10 Essential (primary) hypertension: Secondary | ICD-10-CM

## 2011-08-17 MED ORDER — CHLORTHALIDONE 25 MG PO TABS: 12.5000 mg | ORAL_TABLET | Freq: Every day | ORAL | Status: DC

## 2011-08-17 NOTE — Patient Instructions (Signed)
Your physician recommends that you schedule a follow-up appointment in: 12 months Your physician has recommended you make the following change in your medication: STOP Atenolol/Chlorthalidone and START Chlorthalidone 25 mg 1/2 tab daily

## 2011-08-17 NOTE — Assessment & Plan Note (Signed)
Her carotid disease is mild. No change in therapy.

## 2011-08-17 NOTE — Progress Notes (Signed)
HPI  Patient is seen today to followup coronary disease. I saw her last January, 2012. She's had some problems with ear infections and other issues but no cardiac problems. She has not had chest pain or shortness of breath. She goes about full activities.  As part of today's evaluation I have reviewed my old records and I have carefully updated cardiology aspect of the new electronic medical record   Allergies  Allergen Reactions  . Codeine     REACTION: Elevated Heart rate  . Horse-Derived Products   . Tetanus Toxoid     Current Outpatient Prescriptions  Medication Sig Dispense Refill  . acetaminophen (TYLENOL) 325 MG tablet Take 650 mg by mouth every 6 (six) hours as needed.        Marland Kitchen aspirin 81 MG tablet Take 81 mg by mouth daily.        Marland Kitchen atenolol-chlorthalidone (TENORETIC) 50-25 MG per tablet Take 1 tablet by mouth as directed.  30 tablet  6  . atorvastatin (LIPITOR) 20 MG tablet Take 20 mg by mouth as directed.        . Calcium Carbonate-Vitamin D (CALCIUM-VITAMIN D) 500-200 MG-UNIT per tablet Take 1 tablet by mouth daily.        . cetirizine (ZYRTEC) 10 MG tablet Take 10 mg by mouth as needed.        Tery Sanfilippo Calcium (STOOL SOFTENER PO) Take by mouth as needed.        . donepezil (ARICEPT) 10 MG tablet Take 1 tablet (10 mg total) by mouth at bedtime.  30 tablet  2  . fluticasone (FLONASE) 50 MCG/ACT nasal spray 2 sprays by Nasal route daily.        Marland Kitchen ibuprofen (ADVIL,MOTRIN) 200 MG tablet Take 200 mg by mouth as needed.        . nitroGLYCERIN (NITROSTAT) 0.4 MG SL tablet Place 1 tablet (0.4 mg total) under the tongue as needed.  30 tablet  6  . Polyethylene Glycol 3350 (MIRALAX PO) Take by mouth as needed.        . potassium chloride (K-DUR) 10 MEQ tablet Take 1 tablet (10 mEq total) by mouth daily.  30 tablet  5    History   Social History  . Marital Status: Married    Spouse Name: N/A    Number of Children: N/A  . Years of Education: N/A   Occupational History  . Not  on file.   Social History Main Topics  . Smoking status: Former Games developer  . Smokeless tobacco: Not on file  . Alcohol Use: No  . Drug Use: No  . Sexually Active:    Other Topics Concern  . Not on file   Social History Narrative   Widowed 2000.    Family History  Problem Relation Age of Onset  . Heart attack Father   . Stroke Father   . Dementia Mother   . Breast cancer Mother     Past Medical History  Diagnosis Date  . 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 Hyperlipidemia   . Hypertension   . Hemidiaphragm     elevated hemidiaphragm  . Colon polyps   . Osteoarthritis cervical spine   . Hearing loss   . Allergic rhinitis   . Carotid artery disease     Doppler, October, 2011, 0-39% bilateral  . Colon polyp   .  Hx of CABG     2004  . Ejection fraction     normal, catheterization, 2009  . Bradycardia     January, 2013    Past Surgical History  Procedure Date  . Total abdominal hysterectomy   . Hiatal hernia repair   . Coronary artery bypass graft     ACS  . Cholecystectomy   . Breast lumpectomy     multiple, all benign  . Colonoscopy w/ polypectomy     ROS   Patient denies fever, chills, headache, sweats, rash, change in vision, change in hearing, chest pain, cough, nausea vomiting, urinary symptoms. All other systems are reviewed and are negative other than the history of present illness.  PHYSICAL EXAM  She is stable today. She is oriented to person time and place. Affect is normal. There is no jugulovenous distention. Lungs are clear. Respiratory effort is nonlabored. Cardiac exam reveals S1 and S2. There are no clicks or significant murmurs. The abdomen is soft. There is no peripheral edema. There are no musculoskeletal deformities. There are no skin rashes.  Filed Vitals:   08/17/11 1415  BP: 140/70  Pulse: 45  Height: 5\' 3"  (1.6 m)    Weight: 141 lb 1.9 oz (64.012 kg)    EKG  EKG is done today and reviewed by me. QRS is normal. There is marked sinus bradycardia. Her resting heart rate is 45.  ASSESSMENT & PLAN

## 2011-08-17 NOTE — Assessment & Plan Note (Signed)
Blood pressure is controlled. No change in therapy. 

## 2011-08-17 NOTE — Assessment & Plan Note (Signed)
Coronary disease is stable. Patient underwent followup catheterization in 2009. Her grafts were patent. She had normal LV function. She's not having any significant symptoms. She does not need any testing at this time.

## 2011-08-17 NOTE — Assessment & Plan Note (Signed)
The patient has significant resting sinus bradycardia today. When she was seen recently in her primary care office the pulse was recorded as 60. The patient has not had any significant syncope or presyncope. Part of her meds include a combination of a beta blocker and a diuretic. We need to stop the beta blocker. She is on a combination of atenolol and chlorthalidone. Atenolol will be stopped and she will be on chlorthalidone only. I doubt this we'll have a significant effect on her blood pressure.

## 2011-08-22 ENCOUNTER — Ambulatory Visit
Admission: RE | Admit: 2011-08-22 | Discharge: 2011-08-22 | Disposition: A | Discharge status: Home / Self Care | Payer: Medicare Other | Source: Ambulatory Visit | Attending: Family Medicine | Admitting: Family Medicine

## 2011-08-22 DIAGNOSIS — M8580 Other specified disorders of bone density and structure, unspecified site: Secondary | ICD-10-CM

## 2011-08-22 DIAGNOSIS — M810 Age-related osteoporosis without current pathological fracture: Secondary | ICD-10-CM

## 2011-08-22 DIAGNOSIS — Z1231 Encounter for screening mammogram for malignant neoplasm of breast: Secondary | ICD-10-CM

## 2011-08-22 NOTE — Assessment & Plan Note (Signed)
Pt's PE WNL.  Overdue on DEXA.  UTD on mammo and colonoscopy.  Check labs.  Anticipatory guidance provided.

## 2011-08-22 NOTE — Assessment & Plan Note (Signed)
Chronic problem.  Tolerating statin w/out difficulty.  Check labs.  Adjust meds prn  

## 2011-08-22 NOTE — Assessment & Plan Note (Signed)
Pt's BP is well controlled today.  Asymptomatic.  Check labs.  No changes in meds if labs ok.

## 2011-08-22 NOTE — Assessment & Plan Note (Signed)
Pt has this listed on problem list but cannot remember date of last DEXA or if she has ever taken prescription meds for it.  Will get DEXA and determine next step based on results.  Pt expressed understanding and is in agreement w/ plan.

## 2011-08-23 NOTE — Progress Notes (Signed)
Quick Note:  Pt aware ______ 

## 2011-09-05 ENCOUNTER — Encounter: Payer: Self-pay | Admitting: Family Medicine

## 2011-09-05 ENCOUNTER — Other Ambulatory Visit: Payer: Self-pay | Admitting: Family Medicine

## 2011-09-05 ENCOUNTER — Ambulatory Visit (INDEPENDENT_AMBULATORY_CARE_PROVIDER_SITE_OTHER): Payer: Medicare Other | Admitting: Family Medicine

## 2011-09-05 VITALS — BP 109/75 | HR 57 | Temp 97.6°F | Ht 62.5 in | Wt 140.2 lb

## 2011-09-05 DIAGNOSIS — G5790 Unspecified mononeuropathy of unspecified lower limb: Secondary | ICD-10-CM

## 2011-09-05 MED ORDER — GABAPENTIN 300 MG PO CAPS: 300.0000 mg | ORAL_CAPSULE | Freq: Three times a day (TID) | ORAL | Status: DC

## 2011-09-05 NOTE — Progress Notes (Signed)
  Subjective:    Patient ID: Wanda Wright, female    DOB: 1928/09/05, 78 y.o.   MRN: [id number]  HPI Leg cramps- reports sxs x2-3 months but 'it's getting worse'.  Starting the feet and travels upward.  Bilateral.  sxs are occurring intermittently throughout the day but much worse at night.  Reports will have 'pins and needle feeling', 'sometimes they hurt'.     Review of Systems For ROS see HPI     Objective:   Physical Exam  Vitals reviewed. Constitutional: She appears well-developed and well-nourished. No distress.  Musculoskeletal: She exhibits no edema and no tenderness (of LEs).  Neurological: She has normal reflexes. Coordination normal.       Strength and sensation symmetric and intact in bilateral LEs  Skin: Skin is warm and dry.          Assessment & Plan:

## 2011-09-05 NOTE — Assessment & Plan Note (Signed)
New.  Pt's sxs consistent w/ neuropathic pain/restless leg.  Start Gabapentin and see if sxs improve.  If not, will refer for nerve conduction testing.  Pt expressed understanding and is in agreement w/ plan.

## 2011-09-05 NOTE — Patient Instructions (Signed)
Follow up in 1 month to recheck restless leg/neuropathy Start the gabapentin at night for 3 days and then increase to twice daily x3 days and then increase to 3x/day Call with any questions or concerns Hang in there!!!

## 2011-09-06 ENCOUNTER — Telehealth: Payer: Self-pay | Admitting: *Deleted

## 2011-09-06 MED ORDER — GABAPENTIN 100 MG PO CAPS: 100.0000 mg | ORAL_CAPSULE | Freq: Three times a day (TID) | ORAL | Status: DC

## 2011-09-06 NOTE — Telephone Encounter (Signed)
rx sent to pharmacy by e-script Called pt to give instructions to daughter and pt as follows:  Discussed at OV that this med may cause drowsiness and the side effects will lessen as she gets used to it. If she has the tabs, she can break them in half. If she got the capsules, we can send in a script for 100mg  TID, #90, so she can build up to the 300mg .  Sent medication to pharmacy pt and daughter understood Pt also advised not to fill the celexa fax from [facility] per she is no longer taking, MD aware

## 2011-09-06 NOTE — Telephone Encounter (Signed)
Left message to call office

## 2011-09-06 NOTE — Telephone Encounter (Signed)
Pt states that she took one dose of med last night and it made her feel out of it,droggy, sleepy. Pt thinks that med may be to strong for her..Please advise

## 2011-09-06 NOTE — Telephone Encounter (Signed)
Discussed at OV that this med may cause drowsiness and the side effects will lessen as she gets used to it.  If she has the tabs, she can break them in half.  If she got the capsules, we can send in a script for 100mg  TID, #90, so she can build up to the 300mg .

## 2011-09-07 NOTE — Telephone Encounter (Signed)
Pt states that she already been advised of this the following by Unice Cobble, LPN and Rx sent and has already started med. Pt notes that she feels better on new med, Pt advise to contact office if she has any further reaction to med, Pt ok

## 2011-09-27 ENCOUNTER — Other Ambulatory Visit: Payer: Self-pay | Admitting: Dermatology

## 2011-10-04 ENCOUNTER — Ambulatory Visit (INDEPENDENT_AMBULATORY_CARE_PROVIDER_SITE_OTHER): Payer: Medicare Other | Admitting: Family Medicine

## 2011-10-04 ENCOUNTER — Encounter: Payer: Self-pay | Admitting: Family Medicine

## 2011-10-04 DIAGNOSIS — J309 Allergic rhinitis, unspecified: Secondary | ICD-10-CM

## 2011-10-04 DIAGNOSIS — G5790 Unspecified mononeuropathy of unspecified lower limb: Secondary | ICD-10-CM

## 2011-10-04 NOTE — Assessment & Plan Note (Signed)
Improved since starting neurontin.  Will take 200mg  nightly prn.  Reviewed supportive care and red flags that should prompt return.  Pt expressed understanding and is in agreement w/ plan.

## 2011-10-04 NOTE — Patient Instructions (Signed)
Schedule a follow up in late June or early July for your 6 month cholesterol/BP check Continue the neurontin- you can take 2 pills at night if you are having trouble Restart the Flonase- 2 sprays each nostril daily Continue the Claritin/Zyrtec/Allegra daily Have a wonderful trip!!!

## 2011-10-04 NOTE — Assessment & Plan Note (Signed)
Deteriorated.  Encouraged pt to restart Flonase.  This is cause of ear popping.  Continue OTC antihistamine.  Reviewed supportive care and red flags that should prompt return.  Pt expressed understanding and is in agreement w/ plan.

## 2011-10-04 NOTE — Progress Notes (Signed)
  Subjective:    Patient ID: Wanda Wright, female    DOB: Nov 07, 1928, 78 y.o.   MRN: [id number]  HPI RLS- pt started on Neurontin.  sxs have improved.  Is taking 100mg  TID.    Ear popping- pt reports sxs have returned in the last week.  'i hear them crackle'.  Denies pain, drainage, fever.  + nasal congestion.  Not currently taking flonase.   Review of Systems For ROS see HPI     Objective:   Physical Exam  Vitals reviewed. Constitutional: She appears well-developed and well-nourished. No distress.  HENT:  Head: Normocephalic and atraumatic.  Right Ear: Tympanic membrane normal.  Left Ear: Tympanic membrane normal.  Nose: Mucosal edema and rhinorrhea present. Right sinus exhibits no maxillary sinus tenderness and no frontal sinus tenderness. Left sinus exhibits no maxillary sinus tenderness and no frontal sinus tenderness.  Mouth/Throat: Mucous membranes are normal. Posterior oropharyngeal erythema (w/ PND) present.  Eyes: Conjunctivae and EOM are normal. Pupils are equal, round, and reactive to light.  Neck: Normal range of motion. Neck supple.  Cardiovascular: Normal rate, regular rhythm and normal heart sounds.   Pulmonary/Chest: Effort normal and breath sounds normal. No respiratory distress. She has no wheezes. She has no rales.  Lymphadenopathy:    She has no cervical adenopathy.          Assessment & Plan:

## 2011-10-05 ENCOUNTER — Ambulatory Visit: Payer: Medicare Other | Admitting: Family Medicine

## 2011-10-16 ENCOUNTER — Telehealth: Payer: Self-pay | Admitting: Family Medicine

## 2011-10-16 NOTE — Telephone Encounter (Signed)
Refill for: KLOR-COM 10 ERTAB  Qty 30  Take 1-tablet by mouth every day Last fill date 2.4.13

## 2011-10-16 NOTE — Telephone Encounter (Signed)
Also send for lipitor 20mg  tab qty 90 & Donepezil hcl 10mg  tab qty 30

## 2011-10-17 MED ORDER — POTASSIUM CHLORIDE ER 10 MEQ PO TBCR: 10.0000 meq | EXTENDED_RELEASE_TABLET | Freq: Every day | ORAL | Status: DC

## 2011-10-17 MED ORDER — ATORVASTATIN CALCIUM 20 MG PO TABS: 20.0000 mg | ORAL_TABLET | ORAL | Status: DC

## 2011-10-17 MED ORDER — DONEPEZIL HCL 10 MG PO TABS: 10.0000 mg | ORAL_TABLET | Freq: Every day | ORAL | Status: DC

## 2011-10-17 NOTE — Telephone Encounter (Signed)
rx sent to pharmacy by e-script For lipitor #90 Donepezil and Klor-com

## 2011-10-31 ENCOUNTER — Telehealth: Payer: Self-pay | Admitting: *Deleted

## 2011-10-31 MED ORDER — ALPRAZOLAM 0.25 MG PO TABS: 0.2500 mg | ORAL_TABLET | Freq: Three times a day (TID) | ORAL | Status: AC | PRN

## 2011-10-31 NOTE — Telephone Encounter (Signed)
Pt states that she just loss her grandson and would like to get something Rx to help her deal with her nerves. .Please advise

## 2011-10-31 NOTE — Telephone Encounter (Signed)
Tried to call Pt no answer will try again in AM. Rx sent to pharmacy on file

## 2011-10-31 NOTE — Telephone Encounter (Signed)
Xanax 0.25 mg 1 po tid prn #30   

## 2011-11-01 NOTE — Telephone Encounter (Signed)
Agree w/ xanax

## 2011-11-02 ENCOUNTER — Other Ambulatory Visit: Payer: Self-pay | Admitting: Family Medicine

## 2011-11-02 MED ORDER — GABAPENTIN 100 MG PO CAPS: 100.0000 mg | ORAL_CAPSULE | Freq: Three times a day (TID) | ORAL | Status: DC

## 2011-11-02 NOTE — Telephone Encounter (Signed)
rx sent to pharmacy by e-script  

## 2011-11-02 NOTE — Telephone Encounter (Signed)
Refill for  Gabapentin 100MG  Cap  Qty 90  Take one Capsule by mouth three times daily Last written 1.30.2013

## 2011-11-08 ENCOUNTER — Telehealth: Payer: Self-pay | Admitting: *Deleted

## 2011-11-08 NOTE — Telephone Encounter (Signed)
Spoke to pt to advise results/instructions. Pt understood to advise the DEXA scan summary noted pt has Osteopenina and already takes Ca+ and Vit D, pt understood results, will repeat in 2 years,

## 2011-11-09 ENCOUNTER — Telehealth: Payer: Self-pay | Admitting: Family Medicine

## 2011-11-09 NOTE — Telephone Encounter (Signed)
Patient states she is having trouble with [facility] doing her prescriptions & has requested a change in her chart to be [id number]. I have updated the patients record to reflect this change.  Patient would like a prescription for Chlorthalidon, generic for [place]gro[place] called into [id number]  Chart shows last filled 1.10.13 Last qty 45 Last instructions Take 0.5 tablets (12.5 mg total) by mouth daily.   Patient phone # [phone], she said she will be stepping out for a bit but would be back by 4pm today

## 2011-11-10 ENCOUNTER — Telehealth: Payer: Self-pay | Admitting: Cardiology

## 2011-11-10 MED ORDER — CHLORTHALIDONE 25 MG PO TABS: 12.5000 mg | ORAL_TABLET | Freq: Every day | ORAL | Status: DC

## 2011-11-10 MED ORDER — POTASSIUM CHLORIDE ER 10 MEQ PO TBCR: 10.0000 meq | EXTENDED_RELEASE_TABLET | Freq: Every day | ORAL | Status: DC

## 2011-11-10 NOTE — Telephone Encounter (Signed)
Called pt to advise we switched the [facility] [address], pt notes she is going to see her sister out of town, noted it was sent via escribe, pt then requested to also have the K-dur sent, noted sent via escribe , pt advised that she is down to 3 k-dur and [facility] would not fill them if she had 3 left, I advised that I am unaware of [facility] protocol but she can check with them, pt understood

## 2011-11-10 NOTE — Telephone Encounter (Signed)
New Problem:    Patient called in needing a refill of her chlorthalidone (HYGROTON) 25 MG tablet but it seems like Dr. Rennis Golden office handled the prescription.  Please call back.

## 2011-11-14 ENCOUNTER — Encounter: Payer: Self-pay | Admitting: Family Medicine

## 2011-12-05 ENCOUNTER — Ambulatory Visit (INDEPENDENT_AMBULATORY_CARE_PROVIDER_SITE_OTHER): Payer: Medicare Other | Admitting: Family Medicine

## 2011-12-05 ENCOUNTER — Encounter: Payer: Self-pay | Admitting: Family Medicine

## 2011-12-05 ENCOUNTER — Telehealth: Payer: Self-pay | Admitting: *Deleted

## 2011-12-05 VITALS — BP 112/75 | HR 55 | Temp 97.5°F | Ht 62.0 in | Wt 141.0 lb

## 2011-12-05 DIAGNOSIS — G4700 Insomnia, unspecified: Secondary | ICD-10-CM | POA: Insufficient documentation

## 2011-12-05 DIAGNOSIS — G5790 Unspecified mononeuropathy of unspecified lower limb: Secondary | ICD-10-CM

## 2011-12-05 DIAGNOSIS — I781 Nevus, non-neoplastic: Secondary | ICD-10-CM

## 2011-12-05 MED ORDER — GABAPENTIN 100 MG PO CAPS: 100.0000 mg | ORAL_CAPSULE | Freq: Three times a day (TID) | ORAL | Status: DC

## 2011-12-05 MED ORDER — TRAZODONE HCL 50 MG PO TABS: 25.0000 mg | ORAL_TABLET | Freq: Every evening | ORAL | Status: DC | PRN

## 2011-12-05 NOTE — Patient Instructions (Signed)
Start the Trazodone for sleep- start w/ 1/2 tab The red spots are called cherry angiomas and are very common- they do not require treatment Call with any questions or concerns Hang in there!!!

## 2011-12-05 NOTE — Progress Notes (Signed)
  Subjective:    Patient ID: Wanda Wright, female    DOB: 02-05-1929, 78 y.o.   MRN: [id number]  HPI Rash- 'tiny red spots coming out'.  Noticed last week.  Do not itch.  First noticed on chest, now on stomach and legs as well.    Insomnia- reports that she is having trouble falling asleep b/c 'all the negative things start running through my mind'.  Neuropathy- has not increased to 300mg  yet, needs refill on 100mg  tabs.  Feels this is helping her sxs.   Review of Systems For ROS see HPI     Objective:   Physical Exam  Vitals reviewed. Constitutional: She is oriented to person, place, and time. She appears well-developed and well-nourished. No distress.  Musculoskeletal: She exhibits no edema.  Neurological: She is alert and oriented to person, place, and time.  Skin: Skin is warm and dry. No rash noted.       Multiple cherry angiomas scattered on abdomen, chest, back, groin  Psychiatric: She has a normal mood and affect. Her behavior is normal. Thought content normal.          Assessment & Plan:

## 2011-12-05 NOTE — Assessment & Plan Note (Signed)
Pt feels meds are helping.  Will provide refill.

## 2011-12-05 NOTE — Assessment & Plan Note (Signed)
New.  Most likely due to anxiety.  Will start trazodone for sleep.

## 2011-12-05 NOTE — Telephone Encounter (Signed)
Med sent.

## 2011-12-05 NOTE — Telephone Encounter (Signed)
Pt called back stating that at Ov she advise that she needed refill on med for her neuropathy. Pt notes that she does not know name of med but Dr Beverely Low wrote name down at OV .Please advise

## 2011-12-05 NOTE — Assessment & Plan Note (Signed)
New.  Reviewed dx w/ pt, showed her pictures that look like her spots, discussed benign nature and no need for tx.  Pt feels better about this.

## 2011-12-18 ENCOUNTER — Other Ambulatory Visit: Payer: Self-pay | Admitting: *Deleted

## 2011-12-18 MED ORDER — POTASSIUM CHLORIDE ER 10 MEQ PO TBCR: 10.0000 meq | EXTENDED_RELEASE_TABLET | Freq: Every day | ORAL | Status: DC

## 2011-12-18 NOTE — Telephone Encounter (Signed)
Rx sent 

## 2011-12-20 ENCOUNTER — Ambulatory Visit (INDEPENDENT_AMBULATORY_CARE_PROVIDER_SITE_OTHER): Payer: Medicare Other | Admitting: Family Medicine

## 2011-12-20 ENCOUNTER — Encounter: Payer: Self-pay | Admitting: Family Medicine

## 2011-12-20 VITALS — BP 116/78 | HR 60 | Temp 98.3°F | Ht 63.0 in | Wt 140.9 lb

## 2011-12-20 DIAGNOSIS — J019 Acute sinusitis, unspecified: Secondary | ICD-10-CM

## 2011-12-20 DIAGNOSIS — H6690 Otitis media, unspecified, unspecified ear: Secondary | ICD-10-CM

## 2011-12-20 MED ORDER — AMOXICILLIN 875 MG PO TABS: 875.0000 mg | ORAL_TABLET | Freq: Two times a day (BID) | ORAL | Status: AC

## 2011-12-20 MED ORDER — FLUTICASONE PROPIONATE 50 MCG/ACT NA SUSP: 2.0000 | Freq: Every day | NASAL | Status: DC

## 2011-12-20 NOTE — Patient Instructions (Signed)
This is a sinus infection and R ear infection Start the Amoxicillin twice daily w/ food Continue the nasal spray- 2 sprays each nostril daily Call with any questions or concerns Hang in there!!!

## 2011-12-20 NOTE — Progress Notes (Signed)
  Subjective:    Patient ID: Wanda Wright, female    DOB: 12-Nov-1928, 78 y.o.   MRN: [id number]  HPI Sinus pressure- woke up this AM w/ head congestion, ear fullness, nasal congestion, hoarseness, sore throat.  No fever.  + facial pain, HA.  Took Tylenol this AM w/ some relief.  Using Flonase.  Mild dizziness.   Review of Systems For ROS see HPI     Objective:   Physical Exam  Vitals reviewed. Constitutional: She appears well-developed and well-nourished. No distress.  HENT:  Head: Normocephalic and atraumatic.  Right Ear: Tympanic membrane is injected and erythematous.  Left Ear: Tympanic membrane normal.  Nose: Mucosal edema and rhinorrhea present. Right sinus exhibits maxillary sinus tenderness. Right sinus exhibits no frontal sinus tenderness. Left sinus exhibits maxillary sinus tenderness. Left sinus exhibits no frontal sinus tenderness.  Mouth/Throat: Uvula is midline and mucous membranes are normal. Posterior oropharyngeal erythema present. No oropharyngeal exudate.  Eyes: Conjunctivae and EOM are normal. Pupils are equal, round, and reactive to light.  Neck: Normal range of motion. Neck supple.  Cardiovascular: Normal rate, regular rhythm and normal heart sounds.   Pulmonary/Chest: Effort normal and breath sounds normal. No respiratory distress. She has no wheezes.  Lymphadenopathy:    She has no cervical adenopathy.          Assessment & Plan:

## 2011-12-20 NOTE — Assessment & Plan Note (Signed)
R side.  Start abx.  Reviewed supportive care and red flags that should prompt return.  Pt expressed understanding and is in agreement w/ plan.

## 2011-12-20 NOTE — Assessment & Plan Note (Signed)
New.  sxs and PE consistent w/ infxn.  Start abx.  Reviewed supportive care and red flags that should prompt return.  Pt expressed understanding and is in agreement w/ plan.  

## 2012-01-12 ENCOUNTER — Telehealth: Payer: Self-pay | Admitting: Family Medicine

## 2012-01-12 MED ORDER — DONEPEZIL HCL 10 MG PO TABS: 10.0000 mg | ORAL_TABLET | Freq: Every day | ORAL | Status: DC

## 2012-01-12 NOTE — Telephone Encounter (Signed)
Refill: Donepezil hcl 10mg  tablet. Take 1 tablet by mouth daily at bedtime. Qty 30. Last fill 5.10.13

## 2012-01-12 NOTE — Telephone Encounter (Signed)
rx sent to pharmacy by e-script  

## 2012-01-15 ENCOUNTER — Telehealth: Payer: Self-pay | Admitting: *Deleted

## 2012-01-15 NOTE — Telephone Encounter (Signed)
Called pt daughter to speak with her, was advised that she needs refills on some of her medications, advised by pt that she can not locate the exact medications she needs and will just have her daughter call back in the morning

## 2012-01-15 NOTE — Telephone Encounter (Signed)
Pt daughter left vm requesting that someone call her to discuss her mother with her. Will contact asap

## 2012-01-16 ENCOUNTER — Telehealth: Payer: Self-pay | Admitting: Cardiology

## 2012-01-16 MED ORDER — CHLORTHALIDONE 25 MG PO TABS: ORAL_TABLET | ORAL | Status: DC

## 2012-01-16 NOTE — Telephone Encounter (Signed)
Pt is requesting a refill of her clorthalidone.  Rx refilled.  Pt notified.

## 2012-01-16 NOTE — Telephone Encounter (Signed)
N/A.  LMTC. 

## 2012-01-16 NOTE — Telephone Encounter (Signed)
Please return call to patient [phone], regarding medication concerns

## 2012-01-22 ENCOUNTER — Telehealth: Payer: Self-pay | Admitting: Family Medicine

## 2012-01-22 NOTE — Telephone Encounter (Signed)
Called pt daughter to advise on mobile number given today, placed mobile number in pt chart to update, while trying to call mobile noted the wireless customer unavailable, called home number per unable to leave vm and left detailed message per pt daughter gave permission to do so,

## 2012-01-22 NOTE — Telephone Encounter (Signed)
Notes pt is now taking 2 of the gabapentin TID and in the process of starting to take 3 pills TID, pt daughter still wants the 100mg  pills sent in, pt daughter wants to ask if this can cause her to sleep harder and afraid that she may need to go to the bathroom and not wake up, currently taking half a tab of the trazadone currently is there anything she can take differently, does not want to stop taking per wants to sleep, please advise, pt daughter advised number [phone] can leave detailed message with next steps

## 2012-01-22 NOTE — Telephone Encounter (Signed)
Gabapentin can cause increased sleepiness but this effect tends to level out w/ time.  The appropriate step would be to stop the Trazodone.

## 2012-01-22 NOTE — Telephone Encounter (Signed)
Patient's daughter, Gavin Pound, calling.  She states that Tresa Moore has been trying to call her on her cell phone regarding a refill for this patient.  The cell phone we have in the system is correct.

## 2012-01-23 NOTE — Telephone Encounter (Signed)
See other phone note

## 2012-01-23 NOTE — Telephone Encounter (Signed)
Called pt daughter to give MD Tabori instructions, planning to leave detailed message on mobile per was advised during previous conversation ok to leave detailed message on mobile however noted the wireless customer not available, called home phone number and did reach the daughter to advise to stop the trazadone, pt daughter asked if there is a different sleeping pill that she can take, advised that the issue is NOT with the trazadone that is making her extra sleepy it is the gabapentin that is causing the extra sleepiness per MD Beverely Low notes at first this will happen when increasing the gabapentin and should level out with time, pt daughter noted the pt may not increase the gabapentin any further she will just have to wait and see and will contact office if she wants to start taking the trazadone again once the gabapentin no longer makes her sleepy. Pt daughter understood

## 2012-01-24 NOTE — Telephone Encounter (Signed)
Noted.  She needs OV if further difficulty

## 2012-02-07 ENCOUNTER — Telehealth: Payer: Self-pay | Admitting: Family Medicine

## 2012-02-07 MED ORDER — GABAPENTIN 100 MG PO CAPS: 100.0000 mg | ORAL_CAPSULE | Freq: Three times a day (TID) | ORAL | Status: DC

## 2012-02-07 NOTE — Telephone Encounter (Signed)
GABAPENTIN 100 MG CAPSULE NOR QTY:90 LAST REFILL: 01/23/12 TAKE 1 CAPSULE BY MOUTH 3 TIMES A DAY

## 2012-02-07 NOTE — Telephone Encounter (Signed)
rx sent to pharmacy by e-script  

## 2012-02-14 ENCOUNTER — Encounter: Payer: Self-pay | Admitting: Family Medicine

## 2012-02-14 ENCOUNTER — Ambulatory Visit (INDEPENDENT_AMBULATORY_CARE_PROVIDER_SITE_OTHER): Payer: Medicare Other | Admitting: Family Medicine

## 2012-02-14 VITALS — BP 122/78 | HR 56 | Temp 97.9°F | Ht 62.75 in | Wt 136.6 lb

## 2012-02-14 DIAGNOSIS — S29011A Strain of muscle and tendon of front wall of thorax, initial encounter: Secondary | ICD-10-CM

## 2012-02-14 DIAGNOSIS — IMO0002 Reserved for concepts with insufficient information to code with codable children: Secondary | ICD-10-CM

## 2012-02-14 DIAGNOSIS — N644 Mastodynia: Secondary | ICD-10-CM

## 2012-02-14 MED ORDER — GABAPENTIN 100 MG PO CAPS: ORAL_CAPSULE | ORAL | Status: DC

## 2012-02-14 NOTE — Assessment & Plan Note (Signed)
New.  Suspect that this is more pectoralis pain due to recent raking than true breast pain but due to hx of lumpectomy and mom w/ breast cancer will get diagnostic mammo.  Pt expressed understanding and is in agreement w/ plan.

## 2012-02-14 NOTE — Assessment & Plan Note (Signed)
New.  Pt's TTP over pec tendon and recent hx of raking consistent w/ pec strain.  Tylenol/NSAIDs.  Heat/ice.  Reviewed supportive care and red flags that should prompt return.  Pt expressed understanding and is in agreement w/ plan.

## 2012-02-14 NOTE — Patient Instructions (Addendum)
We'll call you with your mammogram appt Continue the tylenol Add Ibuprofen as needed for the pec strain Call with any questions or concerns Hang in there!!!

## 2012-02-14 NOTE — Progress Notes (Signed)
  Subjective:    Patient ID: Wanda Wright, female    DOB: Jul 03, 1929, 78 y.o.   MRN: [id number]  HPI L breast pain- sxs started ~1 week ago.  Has awoken pt from sleep on multiple occasions.  'i can feel something'.  Hx of breast surgery 20-25 yrs ago for lumpectomy.   No cancer.  'it's definitely in the breast'.  No nipple discharge, no skin changes.  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  Hx of shoulder pain but 'this is different'.  Mom w/ hx of breast cancer.  Has not tried heat/ice for pain.  Worsens w/ movement.   Review of Systems For ROS see HPI     Objective:   Physical Exam  Vitals reviewed. Constitutional: She appears well-developed and well-nourished. No distress.  Pulmonary/Chest: Right breast exhibits no inverted nipple, no mass, no nipple discharge, no skin change and no tenderness. Left breast exhibits tenderness (more over pec than actual breast). Left breast exhibits no inverted nipple, no mass, no nipple discharge and no skin change.            Assessment & Plan:

## 2012-02-15 ENCOUNTER — Other Ambulatory Visit: Payer: Self-pay | Admitting: Family Medicine

## 2012-02-15 NOTE — Telephone Encounter (Signed)
refill gabapentin 100mg  capsule nor #90 take one tablet by mouth 3 times a day , last fill on rx request shows 6.18.13, HOWEVER patient last ov was 7.10.13 and we show reordering this RX #180 wt/1-refill

## 2012-02-16 NOTE — Telephone Encounter (Signed)
Spoke with pharmacy & they confirmed pt still has refills left on file. Ok to ignore refill request.

## 2012-02-22 ENCOUNTER — Other Ambulatory Visit: Payer: Self-pay | Admitting: Family Medicine

## 2012-02-22 ENCOUNTER — Ambulatory Visit
Admission: RE | Admit: 2012-02-22 | Discharge: 2012-02-22 | Disposition: A | Discharge status: Home / Self Care | Payer: Medicare Other | Source: Ambulatory Visit | Attending: Family Medicine | Admitting: Family Medicine

## 2012-02-22 DIAGNOSIS — N644 Mastodynia: Secondary | ICD-10-CM

## 2012-03-15 ENCOUNTER — Telehealth: Payer: Self-pay | Admitting: Family Medicine

## 2012-03-15 NOTE — Telephone Encounter (Signed)
Caller: Debra/Child; Patient Name: Wanda Wright; PCP: Sheliah Hatch.; Best Callback Phone Number: [phone]  Health history, medications, and allergies reviewed: Yes Reason for call with symptoms and associated symptoms: Throat sore, hoarse.  Recent Ear infections with tube placed 3 months ago. Date of onset of symptoms: 03/14/12  Any treatments/meds tried for symptoms and did they work, (please document med, dosage and time): None  Guideline viewed: Sore Throat Disposition: Home Care Advice given: Per Sore Throat or Hoarseness guidelines. Pt. had a severe sore throat causing difficulty swallowing 2 nights ago: 03/13/12.  Pt. is improving, but is still hoarse with sore throat rated 2/10 on a scale of 10.  Pt. afebrile.  Pt. feels that this is due to sinus and ear drainage.  Pt. is not having any difficulty swallowing or breathing now.   Call back if:Per care guidelines  Pt would like to have an appointment tomorrow at [facility] if she doesn't feel better with Home care measures.   If you need to speak with the pt.  please call: [phone]

## 2012-03-15 NOTE — Telephone Encounter (Signed)
Called pt and spoke to daughter who advised that she has an apt scheduled for tomorrow at 9:15am Saturday clinic

## 2012-03-16 ENCOUNTER — Encounter: Payer: Self-pay | Admitting: Family Medicine

## 2012-03-16 ENCOUNTER — Ambulatory Visit (INDEPENDENT_AMBULATORY_CARE_PROVIDER_SITE_OTHER): Payer: Medicare Other | Admitting: Family Medicine

## 2012-03-16 VITALS — BP 144/70 | HR 55 | Temp 97.6°F | Ht 62.75 in | Wt 137.0 lb

## 2012-03-16 DIAGNOSIS — J309 Allergic rhinitis, unspecified: Secondary | ICD-10-CM

## 2012-03-16 NOTE — Patient Instructions (Addendum)
This appears to be due to your allergies Restart your Flonase daily Drink plenty of fluids REST! The tube is in good position and functioning Call with any questions or concerns Hang in there!!!

## 2012-03-16 NOTE — Progress Notes (Signed)
  Subjective:    Patient ID: Wanda Wright, female    DOB: [birth year], 78 y.o.   MRN: [id number]  HPI R ear pain- sxs started 2-3 days ago.  Has tube in R ear- 'it hasn't felt good'.  No fevers.  + PND.  Minimal nasal congestion.  No cough.  + watery eyes but no sinus pain/pressure.  Has been taking Allegra but not using Flonase regularly.   Review of Systems For ROS see HPI     Objective:   Physical Exam  Vitals reviewed. Constitutional: She appears well-developed and well-nourished. No distress.  HENT:  Head: Normocephalic and atraumatic.  Right Ear: Tympanic membrane normal. A foreign body (R tympanosty tube in place) is present.  Left Ear: Tympanic membrane normal.  Nose: Mucosal edema and rhinorrhea present. Right sinus exhibits no maxillary sinus tenderness and no frontal sinus tenderness. Left sinus exhibits no maxillary sinus tenderness and no frontal sinus tenderness.  Mouth/Throat: Mucous membranes are normal. Posterior oropharyngeal erythema (w/ PND) present.  Eyes: Conjunctivae and EOM are normal. Pupils are equal, round, and reactive to light.  Neck: Normal range of motion. Neck supple.  Cardiovascular: Normal rate, regular rhythm and normal heart sounds.   Pulmonary/Chest: Effort normal and breath sounds normal. No respiratory distress. She has no wheezes. She has no rales.  Lymphadenopathy:    She has no cervical adenopathy.          Assessment & Plan:

## 2012-03-16 NOTE — Assessment & Plan Note (Signed)
Recurrent.  Encouraged pt to take Flonase regularly in addition to Allegra.  Pt and daughter expressed understanding.  Will follow.

## 2012-03-28 ENCOUNTER — Encounter: Payer: Self-pay | Admitting: Family Medicine

## 2012-03-28 ENCOUNTER — Ambulatory Visit (INDEPENDENT_AMBULATORY_CARE_PROVIDER_SITE_OTHER): Payer: Medicare Other | Admitting: Family Medicine

## 2012-03-28 VITALS — BP 128/84 | HR 58 | Temp 97.9°F | Ht 62.75 in | Wt 137.4 lb

## 2012-03-28 DIAGNOSIS — L821 Other seborrheic keratosis: Secondary | ICD-10-CM | POA: Insufficient documentation

## 2012-03-28 NOTE — Progress Notes (Signed)
  Subjective:    Patient ID: Wanda Wright, female    DOB: [birth year], 77 y.o.   MRN: [id number]  HPI Spot on my back- 'skin feels rough', irritated.  Can't wear bra comfortably.  No bleeding.  'i can't see it or feel it too good'.  Uncertain how long it has been present, 'a while'.  Has similar area on L side of face- 'i rub it a lot b/c it's rough'.   Review of Systems For ROS see HPI     Objective:   Physical Exam  Vitals reviewed. Constitutional: She appears well-developed and well-nourished. No distress.  Skin: Skin is warm and dry.       Very small area of dry skin on L temple- no peeling, redness, or irritation Large seborrheic keratosis on mid back under bra strap.  No concerning features          Assessment & Plan:

## 2012-03-28 NOTE — Patient Instructions (Addendum)
This is not a skin cancer You do not want to have it removed b/c it would be more painful Tuck the gauze under the bra strap to avoid irritation Call with any questions or concerns Hang in there!!!

## 2012-04-09 NOTE — Assessment & Plan Note (Signed)
New.  Provided reassurance to pt that this is not skin cancer.  Pt appreciative.  Discussed removal but due to location, removal would make area more painful/sensitive than it already is.  Pt in agreement w/ holding off on removal.

## 2012-05-02 ENCOUNTER — Telehealth: Payer: Self-pay | Admitting: Family Medicine

## 2012-05-02 NOTE — Telephone Encounter (Signed)
Caller: [name]/Patient; Patient Name: Wanda Wright; PCP: Sheliah Hatch.; Best Callback Phone Number: [phone]; Reason for call: Pt. complains that she wakes up with leg cramping that subside after ambulation.  Pt. is asking why this is happening.  Triaged with Leg Non-Injury and all emergent symptoms ruled out per guidelines.  Home care instructions were given.  An appoinment was offerred to pt. but she declined.  Pt. was instructed to contact office if home care measures do not provide relief.  CAN/db.

## 2012-05-02 NOTE — Telephone Encounter (Signed)
Increase fluids and K+ intake in form of leafy greens, citrus, bananas

## 2012-05-02 NOTE — Telephone Encounter (Signed)
Please note CAN notation

## 2012-05-02 NOTE — Telephone Encounter (Signed)
Spoke to pt to advise results/instructions. Pt understood. Mailed pt worksheet with potassium rich foods.

## 2012-05-08 ENCOUNTER — Other Ambulatory Visit: Payer: Self-pay | Admitting: Family Medicine

## 2012-05-08 MED ORDER — POTASSIUM CHLORIDE ER 10 MEQ PO TBCR: 10.0000 meq | EXTENDED_RELEASE_TABLET | Freq: Every day | ORAL | Status: DC

## 2012-05-08 MED ORDER — GABAPENTIN 100 MG PO CAPS: ORAL_CAPSULE | ORAL | Status: DC

## 2012-05-08 NOTE — Telephone Encounter (Signed)
rx sent to pharmacy by e-script  

## 2012-05-08 NOTE — Telephone Encounter (Signed)
refills x 2 last ov 8.22.13 Acute   1-Gabapentin (Cap) NEURONTIN 100 MG Take one to two tablets by mouth 3-times a day #180--last fill 8.22.13  2-Potassium Chloride (Tab CR) K-DUR 10 MEQ Take 1 tablet (10 mEq total) by mouth daily #30--last fill 9.6.13

## 2012-05-13 ENCOUNTER — Encounter: Payer: Self-pay | Admitting: Gastroenterology

## 2012-05-15 ENCOUNTER — Ambulatory Visit (INDEPENDENT_AMBULATORY_CARE_PROVIDER_SITE_OTHER): Payer: Medicare Other | Admitting: Family Medicine

## 2012-05-15 ENCOUNTER — Encounter: Payer: Self-pay | Admitting: Family Medicine

## 2012-05-15 VITALS — BP 127/90 | HR 53 | Temp 97.8°F | Ht 62.25 in | Wt 141.0 lb

## 2012-05-15 DIAGNOSIS — R252 Cramp and spasm: Secondary | ICD-10-CM

## 2012-05-15 NOTE — Assessment & Plan Note (Signed)
Recurrent problem for pt.  No evidence of DVT.  No evidence of superficial thrombophlebitis of varicose veins.  Suspect cramping is due to either low K+ or mild dehydration.  Check labs.  Encouraged increased fluid intake.  Will follow.

## 2012-05-15 NOTE — Patient Instructions (Addendum)
Increase your water intake We'll notify you of your lab results and make any changes if needed Call with any questions or concerns Hang in there!!

## 2012-05-15 NOTE — Progress Notes (Signed)
  Subjective:    Patient ID: Wanda Wright, female    DOB: [birth year], 78 y.o.   MRN: [id number]  HPI Leg cramp- occurred 'the other night', mild cramping in L leg, severe in R.  'worst i've had'.  Improves w/ getting up and walking around.  'it didn't last that long', <5 minutes.  Pain started in R calf and radiated around to front of leg and down into foot.  Caused toes to spasm.  Occurred Saturday when she was on her feet a considerable amount (5-6 hrs) in the heat.  Daughter reports pt drinks very limited amount of fluids.   Review of Systems For ROS see HPI     Objective:   Physical Exam  Vitals reviewed. Constitutional: She appears well-developed and well-nourished. No distress.  Musculoskeletal: She exhibits no edema.       No erythema, edema, TTP of lower legs bilaterally + varicose veins of R posterior knee and lower leg w/out evidence of superficial thrombophlebitis  Skin: Skin is warm and dry. No erythema.          Assessment & Plan:

## 2012-05-16 LAB — BASIC METABOLIC PANEL
BUN: 14 mg/dL (ref 6–23)
Chloride: 97 mEq/L (ref 96–112)
Glucose, Bld: 83 mg/dL (ref 70–99)
Potassium: 3.8 mEq/L (ref 3.5–5.1)

## 2012-05-17 ENCOUNTER — Telehealth: Payer: Self-pay | Admitting: Gastroenterology

## 2012-05-17 ENCOUNTER — Telehealth: Payer: Self-pay | Admitting: Family Medicine

## 2012-05-17 NOTE — Telephone Encounter (Signed)
pt received recall letter but said she was not going to be making an appt, and she just wanted [place] to make a note in her chart not to send another letter.  There was no reason given.

## 2012-05-17 NOTE — Telephone Encounter (Signed)
Pt called and needs lab results called to her, pls call pt

## 2012-05-17 NOTE — Telephone Encounter (Signed)
Pt made aware via CMA MW

## 2012-06-25 ENCOUNTER — Other Ambulatory Visit: Payer: Self-pay | Admitting: Cardiology

## 2012-06-25 MED ORDER — NITROGLYCERIN 0.4 MG SL SUBL: 0.4000 mg | SUBLINGUAL_TABLET | SUBLINGUAL | Status: DC | PRN

## 2012-07-02 ENCOUNTER — Other Ambulatory Visit: Payer: Self-pay | Admitting: Family Medicine

## 2012-07-02 DIAGNOSIS — Z1231 Encounter for screening mammogram for malignant neoplasm of breast: Secondary | ICD-10-CM

## 2012-07-10 ENCOUNTER — Ambulatory Visit (INDEPENDENT_AMBULATORY_CARE_PROVIDER_SITE_OTHER): Payer: Medicare Other | Admitting: Family Medicine

## 2012-07-10 ENCOUNTER — Other Ambulatory Visit: Payer: Self-pay | Admitting: Family Medicine

## 2012-07-10 ENCOUNTER — Encounter: Payer: Self-pay | Admitting: Family Medicine

## 2012-07-10 VITALS — BP 140/72 | HR 56 | Temp 97.5°F | Ht 63.0 in | Wt 139.2 lb

## 2012-07-10 DIAGNOSIS — F3289 Other specified depressive episodes: Secondary | ICD-10-CM

## 2012-07-10 DIAGNOSIS — J019 Acute sinusitis, unspecified: Secondary | ICD-10-CM

## 2012-07-10 DIAGNOSIS — F329 Major depressive disorder, single episode, unspecified: Secondary | ICD-10-CM

## 2012-07-10 MED ORDER — DONEPEZIL HCL 10 MG PO TABS: 10.0000 mg | ORAL_TABLET | Freq: Every day | ORAL | Status: DC

## 2012-07-10 MED ORDER — AZITHROMYCIN 250 MG PO TABS: ORAL_TABLET | ORAL | Status: DC

## 2012-07-10 NOTE — Progress Notes (Signed)
  Subjective:    Patient ID: Wanda Wright, female    DOB: 1928/08/15, 78 y.o.   MRN: [id number]  HPI Head congestion- pressure, particularly on L side.  'i feel drunk'.  Decreased energy x few days.  No fevers.  + ear fullness but no pain.  + nasal congestion/drainage.  No cough.  No known sick contacts.  Hx of allergic rhinitis.  + fatigue due to care giver stress.  Daughter feels there's some depression at play.  Pt admits this might be true- is mourning the loss of close family member that she cared for who was close in age.   Review of Systems For ROS see HPI     Objective:   Physical Exam  Vitals reviewed. Constitutional: She appears well-developed and well-nourished. No distress.  HENT:  Head: Normocephalic and atraumatic.  Right Ear: Tympanic membrane normal.  Left Ear: Tympanic membrane normal.  Nose: Mucosal edema and rhinorrhea present. Right sinus exhibits maxillary sinus tenderness (mild) and frontal sinus tenderness (mild). Left sinus exhibits maxillary sinus tenderness (mild) and frontal sinus tenderness (mild).  Mouth/Throat: Uvula is midline and mucous membranes are normal. Posterior oropharyngeal erythema present. No oropharyngeal exudate.  Eyes: Conjunctivae normal and EOM are normal. Pupils are equal, round, and reactive to light.  Neck: Normal range of motion. Neck supple.  Cardiovascular: Normal rate, regular rhythm and normal heart sounds.   Pulmonary/Chest: Effort normal and breath sounds normal. No respiratory distress. She has no wheezes.  Lymphadenopathy:    She has no cervical adenopathy.          Assessment & Plan:

## 2012-07-10 NOTE — Patient Instructions (Addendum)
Start the Zpack as directed for early sinus infection Switch from Allegra to Claritin or Zyrtec Continue the flonase daily Drink plenty of fluids REST! Hang in there! Happy Holidays!!

## 2012-07-10 NOTE — Telephone Encounter (Signed)
Rx sent.    MW 

## 2012-07-10 NOTE — Telephone Encounter (Signed)
refill Donepezil HCL 10 MG Tablet #30 take one tablet by mouth at bedtime last fill 11.4.13 last ov 10.9.13 acute BMP lab also done same day

## 2012-07-16 NOTE — Assessment & Plan Note (Signed)
Deteriorated slightly due to recent death in family.  Reviewed grieving process and that what she is feeling is normal and not pathologic unless it begins to interfere w/ her daily life and activities.  Will follow closely.  No med changes at this time.

## 2012-07-16 NOTE — Assessment & Plan Note (Signed)
New.  Pt is likely early in infxn but sxs are consistent.  Start abx.  Reviewed supportive care and red flags that should prompt return.  Pt expressed understanding and is in agreement w/ plan.

## 2012-07-23 ENCOUNTER — Telehealth: Payer: Self-pay | Admitting: Family Medicine

## 2012-07-23 DIAGNOSIS — E785 Hyperlipidemia, unspecified: Secondary | ICD-10-CM

## 2012-07-23 MED ORDER — ATORVASTATIN CALCIUM 20 MG PO TABS: 20.0000 mg | ORAL_TABLET | ORAL | Status: DC

## 2012-07-23 NOTE — Telephone Encounter (Signed)
Refill Atorvastatin Calcium (Tab) 20 MG Take 1 tablet (20 mg total) by mouth as directed. #90 last fill 6.10.13, last ov 12.4.13 acute & last labs BMP 10.9.13

## 2012-07-23 NOTE — Telephone Encounter (Signed)
Refill for Atorvastatin sent to pharmacy #90. SGJ, RN

## 2012-08-12 ENCOUNTER — Other Ambulatory Visit: Payer: Self-pay | Admitting: *Deleted

## 2012-08-12 DIAGNOSIS — E119 Type 2 diabetes mellitus without complications: Secondary | ICD-10-CM

## 2012-08-12 MED ORDER — GABAPENTIN 100 MG PO CAPS: ORAL_CAPSULE | ORAL | Status: DC

## 2012-08-12 NOTE — Telephone Encounter (Signed)
Refill for gabapentin sent to [facility] per pt request

## 2012-08-23 ENCOUNTER — Ambulatory Visit
Admission: RE | Admit: 2012-08-23 | Discharge: 2012-08-23 | Disposition: A | Discharge status: Home / Self Care | Payer: Medicare Other | Source: Ambulatory Visit | Attending: Family Medicine | Admitting: Family Medicine

## 2012-08-23 DIAGNOSIS — Z1231 Encounter for screening mammogram for malignant neoplasm of breast: Secondary | ICD-10-CM

## 2012-08-26 ENCOUNTER — Ambulatory Visit (INDEPENDENT_AMBULATORY_CARE_PROVIDER_SITE_OTHER): Payer: Medicare Other | Admitting: Cardiology

## 2012-08-26 ENCOUNTER — Ambulatory Visit: Payer: Medicare Other | Admitting: Cardiology

## 2012-08-26 ENCOUNTER — Encounter: Payer: Self-pay | Admitting: Cardiology

## 2012-08-26 VITALS — BP 102/68 | HR 60 | Ht 63.0 in | Wt 142.4 lb

## 2012-08-26 DIAGNOSIS — I498 Other specified cardiac arrhythmias: Secondary | ICD-10-CM

## 2012-08-26 DIAGNOSIS — I2510 Atherosclerotic heart disease of native coronary artery without angina pectoris: Secondary | ICD-10-CM

## 2012-08-26 DIAGNOSIS — E785 Hyperlipidemia, unspecified: Secondary | ICD-10-CM

## 2012-08-26 DIAGNOSIS — R001 Bradycardia, unspecified: Secondary | ICD-10-CM

## 2012-08-26 DIAGNOSIS — I10 Essential (primary) hypertension: Secondary | ICD-10-CM

## 2012-08-26 NOTE — Assessment & Plan Note (Signed)
Coronary disease is stable. She has no symptoms. She had a cath showing patent grafts in 2009. I've chosen not to proceed with any type of exercise testing at this time.

## 2012-08-26 NOTE — Assessment & Plan Note (Signed)
Blood pressures control. No change in therapy. 

## 2012-08-26 NOTE — Assessment & Plan Note (Signed)
I had adjusted her meds January, 2013. She's not having any symptomatic bradycardia. No further workup.

## 2012-08-26 NOTE — Progress Notes (Signed)
HPI  Patient is seen today to followup coronary disease. She's feeling very well. She's not having any chest pain or shortness of breath. She underwent CABG in 2004. Catheterization in 2009 revealed patent grafts. She is fully active. She's here with her daughter. I saw her last January, 2013.  Allergies  Allergen Reactions  . Codeine     REACTION: Elevated Heart rate  . Horse-Derived Products   . Tetanus Toxoid     Current Outpatient Prescriptions  Medication Sig Dispense Refill  . acetaminophen (TYLENOL) 325 MG tablet Take 650 mg by mouth every 6 (six) hours as needed.        . ALPRAZolam (XANAX) 0.25 MG tablet       . aspirin 81 MG tablet Take 81 mg by mouth daily.        Marland Kitchen atorvastatin (LIPITOR) 20 MG tablet Take 10 mg by mouth as directed.      . calcium carbonate (OS-CAL) 600 MG TABS Take 1,200 mg by mouth daily.      . chlorthalidone (HYGROTON) 25 MG tablet 12.5 mg. Take as prescribed      . donepezil (ARICEPT) 10 MG tablet Take 1 tablet (10 mg total) by mouth at bedtime.  30 tablet  5  . fluticasone (FLONASE) 50 MCG/ACT nasal spray Place 2 sprays into the nose daily.  16 g  6  . gabapentin (NEURONTIN) 100 MG capsule Take 100 mg by mouth 4 (four) times daily. Take one to two tablets TID      . loratadine (KLS ALLERCLEAR) 10 MG tablet Take 10 mg by mouth daily.      . nitroGLYCERIN (NITROSTAT) 0.4 MG SL tablet Place 1 tablet (0.4 mg total) under the tongue as needed.  30 tablet  6  . Polyethylene Glycol 3350 (MIRALAX PO) Take by mouth as needed.        . potassium chloride (K-DUR) 10 MEQ tablet Take 1 tablet (10 mEq total) by mouth daily.  30 tablet  5  . traZODone (DESYREL) 50 MG tablet Take 25 mg by mouth at bedtime as needed.         History   Social History  . Marital Status: Married    Spouse Name: N/A    Number of Children: N/A  . Years of Education: N/A   Occupational History  . Not on file.   Social History Main Topics  . Smoking status: Former Games developer  .  Smokeless tobacco: Not on file  . Alcohol Use: No  . Drug Use: No  . Sexually Active:    Other Topics Concern  . Not on file   Social History Narrative   Widowed 2000.    Family History  Problem Relation Age of Onset  . Heart attack Father   . Stroke Father   . Dementia Mother   . Breast cancer Mother     Past Medical History  Diagnosis Date  . 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 Hyperlipidemia   . Hypertension   . Hemidiaphragm     elevated hemidiaphragm  . Colon polyps   . Osteoarthritis cervical spine   . Hearing loss   . Allergic rhinitis   . Carotid artery disease     Doppler, October, 2011, 0-39% bilateral  . Colon polyp   . Hx of CABG     2004  .  Ejection fraction     normal, catheterization, 2009  . Bradycardia     January, 2013    Past Surgical History  Procedure Date  . Total abdominal hysterectomy   . Hiatal hernia repair   . Coronary artery bypass graft     ACS  . Cholecystectomy   . Breast lumpectomy     multiple, all benign  . Colonoscopy w/ polypectomy     Patient Active Problem List  Diagnosis  . COLONIC POLYPS  . HYPERLIPIDEMIA, MIXED  . DEPRESSIVE DISORDER  . DISEASE, HYPERTENSIVE HEART NOS, W/O HF  . ALLERGIC RHINITIS  . SEBACEOUS CYST, SCALP  . OSTEOARTHRITIS, CERVICAL SPINE  . OSTEOPOROSIS  . NEOPLASM, SKIN, UNCERTAIN BEHAVIOR  . OTHER&UNSPECIFIED DISEASES THE ORAL SOFT TISSUES  . GAIT IMBALANCE  . DYSPHAGIA  . Memory loss  . Constipation  . Leg cramps  . Sweating  . Flushing  . OM (otitis media)  . General medical examination  . CAD (coronary artery disease)  . Hyperlipidemia  . Hypertension  . Hemidiaphragm  . Carotid artery disease  . Hx of CABG  . Ejection fraction  . Bradycardia  . Neuropathy of leg  . Cherry angioma  . Insomnia  . Sinusitis, acute  . Breast pain  . Pectoralis muscle strain    . Keratosis, seborrheic    ROS   Patient denies fever, chills, headache, sweats, rash, change in vision, change in hearing, chest pain, cough, nausea vomiting, urinary symptoms. She does have arthritic pain in her shoulder. All other systems are reviewed and are negative.  PHYSICAL EXAM  Patient is here with her daughter. She is oriented to person time and place. Affect is normal. There is no jugulovenous distention. Lungs are clear. Respiratory effort is nonlabored. Cardiac exam reveals S1 and S2. There no clicks or significant murmurs. The abdomen is soft. There is no peripheral edema.  Filed Vitals:   08/26/12 1540  BP: 102/68  Pulse: 60  Height: 5\' 3"  (1.6 m)  Weight: 142 lb 6.4 oz (64.592 kg)     EKG is done today and reviewed by me. It is normal. There is no significant change.  ASSESSMENT & PLAN

## 2012-08-26 NOTE — Patient Instructions (Addendum)
Your physician wants you to follow-up in: 1 year  You will receive a reminder letter in the mail two months in advance. If you don't receive a letter, please call our office to schedule the follow-up appointment.  Your physician recommends that you continue on your current medications as directed. Please refer to the Current Medication list given to you today.  

## 2012-08-26 NOTE — Assessment & Plan Note (Signed)
Lipids are being treated. No change in therapy. 

## 2012-09-09 ENCOUNTER — Ambulatory Visit (INDEPENDENT_AMBULATORY_CARE_PROVIDER_SITE_OTHER): Payer: Medicare Other | Admitting: Internal Medicine

## 2012-09-09 ENCOUNTER — Encounter: Payer: Self-pay | Admitting: Internal Medicine

## 2012-09-09 ENCOUNTER — Telehealth: Payer: Self-pay | Admitting: Family Medicine

## 2012-09-09 VITALS — BP 104/68 | HR 82 | Temp 98.9°F | Wt 136.0 lb

## 2012-09-09 DIAGNOSIS — K529 Noninfective gastroenteritis and colitis, unspecified: Secondary | ICD-10-CM

## 2012-09-09 DIAGNOSIS — K5289 Other specified noninfective gastroenteritis and colitis: Secondary | ICD-10-CM

## 2012-09-09 NOTE — Patient Instructions (Addendum)
Rest, drink plenty of clear fluids, soup, jello. Okay to take your medications except hygrotone, ok to skip it today and tomorrow. Come back anytime if you have severe symptoms or you start feeling weaker.  Nausea and Vomiting Nausea is a sick feeling that often comes before throwing up (vomiting). Vomiting is a reflex where stomach contents come out of your mouth. Vomiting can cause severe loss of body fluids (dehydration). Children and elderly adults can become dehydrated quickly, especially if they also have diarrhea. Nausea and vomiting are symptoms of a condition or disease. It is important to find the cause of your symptoms. CAUSES   Direct irritation of the stomach lining. This irritation can result from increased acid production (gastroesophageal reflux disease), infection, food poisoning, taking certain medicines (such as nonsteroidal anti-inflammatory drugs), alcohol use, or tobacco use.  Signals from the brain.These signals could be caused by a headache, heat exposure, an inner ear disturbance, increased pressure in the brain from injury, infection, a tumor, or a concussion, pain, emotional stimulus, or metabolic problems.  An obstruction in the gastrointestinal tract (bowel obstruction).  Illnesses such as diabetes, hepatitis, gallbladder problems, appendicitis, kidney problems, cancer, sepsis, atypical symptoms of a heart attack, or eating disorders.  Medical treatments such as chemotherapy and radiation.  Receiving medicine that makes you sleep (general anesthetic) during surgery. DIAGNOSIS Your caregiver may ask for tests to be done if the problems do not improve after a few days. Tests may also be done if symptoms are severe or if the reason for the nausea and vomiting is not clear. Tests may include:  Urine tests.  Blood tests.  Stool tests.  Cultures (to look for evidence of infection).  X-rays or other imaging studies. Test results can help your caregiver make  decisions about treatment or the need for additional tests. TREATMENT You need to stay well hydrated. Drink frequently but in small amounts.You may wish to drink water, sports drinks, clear broth, or eat frozen ice pops or gelatin dessert to help stay hydrated.When you eat, eating slowly may help prevent nausea.There are also some antinausea medicines that may help prevent nausea. HOME CARE INSTRUCTIONS   Take all medicine as directed by your caregiver.  If you do not have an appetite, do not force yourself to eat. However, you must continue to drink fluids.  If you have an appetite, eat a normal diet unless your caregiver tells you differently.  Eat a variety of complex carbohydrates (rice, wheat, potatoes, bread), lean meats, yogurt, fruits, and vegetables.  Avoid high-fat foods because they are more difficult to digest.  Drink enough water and fluids to keep your urine clear or pale yellow.  If you are dehydrated, ask your caregiver for specific rehydration instructions. Signs of dehydration may include:  Severe thirst.  Dry lips and mouth.  Dizziness.  Dark urine.  Decreasing urine frequency and amount.  Confusion.  Rapid breathing or pulse. SEEK IMMEDIATE MEDICAL CARE IF:   You have blood or brown flecks (like coffee grounds) in your vomit.  You have black or bloody stools.  You have a severe headache or stiff neck.  You are confused.  You have severe abdominal pain.  You have chest pain or trouble breathing.  You do not urinate at least once every 8 hours.  You develop cold or clammy skin.  You continue to vomit for longer than 24 to 48 hours.  You have a fever. MAKE SURE YOU:   Understand these instructions.  Will watch your  condition.  Will get help right away if you are not doing well or get worse. Document Released: 07/24/2005 Document Revised: 10/16/2011 Document Reviewed: 12/21/2010 [facility] Patient Information 2013 [place], [place].

## 2012-09-09 NOTE — Telephone Encounter (Signed)
Pt is going to go to the emergency room instead of coming in here

## 2012-09-09 NOTE — Telephone Encounter (Signed)
Pt was seen today in office by Dr Drue Novel for acute illness.  Did not go to ER

## 2012-09-09 NOTE — Progress Notes (Signed)
  Subjective:    Patient ID: Wanda Wright, female    DOB: [birth year], 79 y.o.   MRN: [id number]  HPI Acute visit Patient is here with her daughter, symptoms started yesterday afternoon, she started with headaches and some nausea. Overnight, she vomited several times. She vomited some mucus, no hematemesis. This morning she vomited a few more times, the last episode at 9:30 AM. She has been able to drink some water, at most 2 cups today. Has not taken her medication.  Past Medical History  Diagnosis Date  . 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 Hyperlipidemia   . Hypertension   . Hemidiaphragm     elevated hemidiaphragm  . Colon polyps   . Osteoarthritis cervical spine   . Hearing loss   . Allergic rhinitis   . Carotid artery disease     Doppler, October, 2011, 0-39% bilateral  . Colon polyp   . Hx of CABG     2004  . Ejection fraction     normal, catheterization, 2009  . Bradycardia     January, 2013   Past Surgical History  Procedure Date  . Total abdominal hysterectomy   . Hiatal hernia repair   . Coronary artery bypass graft     ACS  . Cholecystectomy   . Breast lumpectomy     multiple, all benign  . Colonoscopy w/ polypectomy       Review of Systems No abdominal pain. Had a small amount of diarrhea this morning without blood. Denies any headache, cough, rash. Mild dizziness.     Objective:   Physical Exam General -- alert, well-developed, VSS , nontoxic.   HEENT --  not pale, no jaundice. Oral membranes slightly dry Lungs -- normal respiratory effort, no intercostal retractions, no accessory muscle use, and normal breath sounds.   Heart-- normal rate, regular rhythm, no murmur, and no gallop.   Abdomen--not distended, decreased bowel sounds, soft, nontender. No mass or rebound   Psych-- Cognition and judgment appear intact. Alert and  cooperative with normal attention span and concentration.  not anxious appearing and not depressed appearing.      Assessment & Plan:   Acute gastroenteritis, Several people in the community affected with similar symptoms, likely a viral disease. See instructions.

## 2012-09-09 NOTE — Telephone Encounter (Signed)
Please advise.//AB/CMA 

## 2012-09-21 ENCOUNTER — Other Ambulatory Visit: Payer: Self-pay

## 2012-12-03 ENCOUNTER — Other Ambulatory Visit: Payer: Self-pay | Admitting: Family Medicine

## 2012-12-03 NOTE — Telephone Encounter (Signed)
Med filled.  

## 2012-12-09 ENCOUNTER — Telehealth: Payer: Self-pay | Admitting: *Deleted

## 2012-12-09 MED ORDER — POTASSIUM CHLORIDE ER 10 MEQ PO TBCR: 10.0000 meq | EXTENDED_RELEASE_TABLET | Freq: Every day | ORAL | Status: DC

## 2012-12-09 NOTE — Telephone Encounter (Signed)
Ok to change to previous tab (if known) or pt is able to break the tab in 1/2 and take the 2 half tabs

## 2012-12-09 NOTE — Telephone Encounter (Signed)
Pt daughter left VM that she is having difficulty swallowing her Potassium tablet. Pt daughter indicated that Pt was on a different potassium tab before and she did not have this issue. Pt daughter request that med be change to that.Please advise

## 2012-12-09 NOTE — Telephone Encounter (Signed)
Pt daughter states that Pt was previously on k-dur and this time the Rx was for klor-con. Pt daughter would like to get Rx sent in for k-dur. .Rx sent Pt daughter aware.

## 2013-01-02 ENCOUNTER — Encounter: Payer: Self-pay | Admitting: Lab

## 2013-01-03 ENCOUNTER — Ambulatory Visit (INDEPENDENT_AMBULATORY_CARE_PROVIDER_SITE_OTHER): Payer: Medicare Other | Admitting: Family Medicine

## 2013-01-03 ENCOUNTER — Encounter: Payer: Self-pay | Admitting: Family Medicine

## 2013-01-03 VITALS — BP 138/60 | HR 52 | Temp 97.8°F | Ht 61.75 in | Wt 136.2 lb

## 2013-01-03 DIAGNOSIS — I10 Essential (primary) hypertension: Secondary | ICD-10-CM

## 2013-01-03 DIAGNOSIS — Z0000 Encounter for general adult medical examination without abnormal findings: Secondary | ICD-10-CM

## 2013-01-03 DIAGNOSIS — J309 Allergic rhinitis, unspecified: Secondary | ICD-10-CM

## 2013-01-03 DIAGNOSIS — E785 Hyperlipidemia, unspecified: Secondary | ICD-10-CM

## 2013-01-03 DIAGNOSIS — M810 Age-related osteoporosis without current pathological fracture: Secondary | ICD-10-CM

## 2013-01-03 LAB — CBC WITH DIFFERENTIAL/PLATELET
Basophils Absolute: 0.1 10*3/uL (ref 0.0–0.1)
Basophils Relative: 0.9 % (ref 0.0–3.0)
Eosinophils Absolute: 0.2 10*3/uL (ref 0.0–0.7)
Eosinophils Relative: 3.5 % (ref 0.0–5.0)
HCT: 36.3 % (ref 36.0–46.0)
Hemoglobin: 12.4 g/dL (ref 12.0–15.0)
Lymphocytes Relative: 20.1 % (ref 12.0–46.0)
Lymphs Abs: 1.1 10*3/uL (ref 0.7–4.0)
MCHC: 34.1 g/dL (ref 30.0–36.0)
MCV: 88.6 fl (ref 78.0–100.0)
Monocytes Absolute: 0.4 10*3/uL (ref 0.1–1.0)
Monocytes Relative: 8.2 % (ref 3.0–12.0)
Neutro Abs: 3.7 10*3/uL (ref 1.4–7.7)
Neutrophils Relative %: 67.3 % (ref 43.0–77.0)
Platelets: 180 10*3/uL (ref 150.0–400.0)
RBC: 4.1 Mil/uL (ref 3.87–5.11)
RDW: 14 % (ref 11.5–14.6)
WBC: 5.4 10*3/uL (ref 4.5–10.5)

## 2013-01-03 LAB — LIPID PANEL
Cholesterol: 151 mg/dL (ref 0–200)
HDL: 70.9 mg/dL (ref >39.00)
LDL Cholesterol: 62 mg/dL (ref 0–99)
Total CHOL/HDL Ratio: 2
Triglycerides: 91 mg/dL (ref 0.0–149.0)
VLDL: 18.2 mg/dL (ref 0.0–40.0)

## 2013-01-03 LAB — BASIC METABOLIC PANEL
BUN: 19 mg/dL (ref 6–23)
CO2: 30 mEq/L (ref 19–32)
Calcium: 9.8 mg/dL (ref 8.4–10.5)
Chloride: 101 mEq/L (ref 96–112)
Creatinine, Ser: 1 mg/dL (ref 0.4–1.2)
GFR: 55.8 mL/min — ABNORMAL LOW (ref >60.00)
Glucose, Bld: 81 mg/dL (ref 70–99)
Potassium: 3.6 mEq/L (ref 3.5–5.1)
Sodium: 138 mEq/L (ref 135–145)

## 2013-01-03 LAB — TSH: TSH: 1.17 u[IU]/mL (ref 0.35–5.50)

## 2013-01-03 LAB — HEPATIC FUNCTION PANEL
AST: 18 U/L (ref 0–37)
Albumin: 3.7 g/dL (ref 3.5–5.2)
Total Bilirubin: 0.4 mg/dL (ref 0.3–1.2)

## 2013-01-03 MED ORDER — FLUTICASONE PROPIONATE 50 MCG/ACT NA SUSP: 2.0000 | Freq: Every day | NASAL | Status: DC

## 2013-01-03 NOTE — Progress Notes (Signed)
  Subjective:    Patient ID: Wanda Wright, female    DOB: [birth year], 79 y.o.   MRN: [id number]  HPI Here today for CPE.  Risk Factors: HTN- chronic problem, adequate control today.  On Chlorthalidone daily.  Denies CP, SOB, HAs, visual changes, edema. Hyperlipidemia- chronic problem, on lipitor.  No N/V, abd pain, myalgias Seasonal allergies- reports she's currently on OTC antihistamine but still struggling w/ nasal congestion, itchy/watery eyes. Physical Activity: walking regularly, yard work Fall Risk: low risk, steady on feet Depression: chronic problem, reports sxs are well controlled currently Hearing: no concerns, normal to conversational tones, mildly decreased to whispered voice ADL's: independent Cognitive: normal linear thought process, attention intact.  Some mild memory issues Home Safety: safe at home, living w/ daughter Height, Weight, BMI, Visual Acuity: see vitals, vision corrected to 20/20 w/ glasses Counseling: UTD on colonoscopy, mammo, DEXA Labs Ordered: See A&P Care Plan: See A&P    Review of Systems Patient reports no vision/ hearing changes, adenopathy,fever, weight change,  persistant/recurrent hoarseness , swallowing issues, chest pain, palpitations, edema, persistant/recurrent cough, hemoptysis, dyspnea (rest/exertional/paroxysmal nocturnal), gastrointestinal bleeding (melena, rectal bleeding), abdominal pain, significant heartburn, bowel changes, GU symptoms (dysuria, hematuria, incontinence), Gyn symptoms (abnormal  bleeding, pain),  syncope, focal weakness, numbness & tingling, skin/hair/nail changes, abnormal bruising or bleeding, anxiety, or depression.     Objective:   Physical Exam General Appearance:    Alert, cooperative, no distress, appears stated age  Head:    Normocephalic, without obvious abnormality, atraumatic  Eyes:    PERRL, conjunctiva/corneas clear, EOM's intact, fundi    benign, both eyes  Ears:    Normal TM's and external ear canals,  both ears  Nose:   Nares normal, septum midline, mucosa normal, no drainage    or sinus tenderness  Throat:   Lips, mucosa, and tongue normal; teeth and gums normal  Neck:   Supple, symmetrical, trachea midline, no adenopathy;    Thyroid: no enlargement/tenderness/nodules  Back:     Symmetric, no curvature, ROM normal, no CVA tenderness  Lungs:     Clear to auscultation bilaterally, respirations unlabored  Chest Wall:    No tenderness or deformity   Heart:    Regular rate and rhythm, S1 and S2 normal, no murmur, rub   or gallop  Breast Exam:    Deferred  Abdomen:     Soft, non-tender, bowel sounds active all four quadrants,    no masses, no organomegaly  Genitalia:    Deferred  Rectal:    Extremities:   Extremities normal, atraumatic, no cyanosis or edema  Pulses:   2+ and symmetric all extremities  Skin:   Skin color, texture, turgor normal, no rashes or lesions  Lymph nodes:   Cervical, supraclavicular, and axillary nodes normal  Neurologic:   CNII-XII intact, normal strength, sensation and reflexes    throughout          Assessment & Plan:

## 2013-01-03 NOTE — Patient Instructions (Addendum)
Follow up in 6 months to recheck blood pressure and cholesterol- sooner if needed We'll notify you of your lab results and make any changes if needed Keep up the good work!  You look great! Call with any questions or concerns Have a great summer!!!

## 2013-01-05 NOTE — Assessment & Plan Note (Signed)
Chronic problem.  Adequate control.  Asymptomatic.  Check labs.  No anticipated med changes 

## 2013-01-05 NOTE — Assessment & Plan Note (Signed)
Chronic problem.  Continue OTC antihistamine.  Can add OTC nasal steroid if sxs continue to be bothersome.  Will follow.

## 2013-01-05 NOTE — Assessment & Plan Note (Signed)
Chronic problem.  Tolerating statin w/out difficulty.  Check labs.  Adjust meds prn  

## 2013-01-05 NOTE — Assessment & Plan Note (Signed)
Pt's PE WNL.  UTD on health maintenance.  Check labs.  Anticipatory guidance provided.  

## 2013-01-05 NOTE — Assessment & Plan Note (Signed)
Chronic problem.  Check Vit D level.  Continue Ca.  UTD on DEXA.

## 2013-01-08 LAB — VITAMIN D 1,25 DIHYDROXY
Vitamin D 1, 25 (OH)2 Total: 25 pg/mL (ref 18–72)
Vitamin D3 1, 25 (OH)2: 25 pg/mL

## 2013-01-09 ENCOUNTER — Other Ambulatory Visit: Payer: Self-pay | Admitting: Family Medicine

## 2013-01-09 ENCOUNTER — Telehealth: Payer: Self-pay | Admitting: General Practice

## 2013-01-09 MED ORDER — VITAMIN D (ERGOCALCIFEROL) 1.25 MG (50000 UNIT) PO CAPS: 50000.0000 [IU] | ORAL_CAPSULE | ORAL | Status: DC

## 2013-01-09 NOTE — Telephone Encounter (Signed)
Med filled. Pt notified.  

## 2013-01-09 NOTE — Telephone Encounter (Signed)
Needs to start prescription Vit D 50,000 units weekly x8 weeks, #8, no refills.  Continue the daily Vit D

## 2013-01-09 NOTE — Telephone Encounter (Signed)
Pt daughter called regarding pt Vitamin D labs. Daughter states that pt is already taking 1600iu daily of Vitamin D. Please advise.

## 2013-01-10 NOTE — Telephone Encounter (Signed)
Med filled.  

## 2013-01-29 ENCOUNTER — Other Ambulatory Visit: Payer: Self-pay | Admitting: Cardiology

## 2013-01-30 NOTE — Telephone Encounter (Signed)
chlorthalidone (HYGROTON) 25 MG tablet  12.5 mg. Take as prescribed

## 2013-02-03 ENCOUNTER — Other Ambulatory Visit: Payer: Self-pay | Admitting: Family Medicine

## 2013-03-04 ENCOUNTER — Telehealth: Payer: Self-pay | Admitting: *Deleted

## 2013-03-04 NOTE — Telephone Encounter (Signed)
Spoke with pt advised of Vit D level and MD recommendations to take 1000 units of Vit D daily.

## 2013-04-11 ENCOUNTER — Other Ambulatory Visit: Payer: Self-pay | Admitting: Family Medicine

## 2013-04-11 NOTE — Telephone Encounter (Signed)
Med filled.  

## 2013-04-14 ENCOUNTER — Other Ambulatory Visit: Payer: Self-pay | Admitting: Family Medicine

## 2013-05-17 ENCOUNTER — Other Ambulatory Visit: Payer: Self-pay | Admitting: Family Medicine

## 2013-05-19 NOTE — Telephone Encounter (Signed)
Med filled.  

## 2013-06-06 ENCOUNTER — Ambulatory Visit (INDEPENDENT_AMBULATORY_CARE_PROVIDER_SITE_OTHER): Payer: Medicare Other

## 2013-06-06 DIAGNOSIS — Z23 Encounter for immunization: Secondary | ICD-10-CM

## 2013-06-10 ENCOUNTER — Other Ambulatory Visit: Payer: Self-pay | Admitting: Family Medicine

## 2013-06-10 NOTE — Telephone Encounter (Signed)
Med filled.  

## 2013-07-07 ENCOUNTER — Other Ambulatory Visit: Payer: Self-pay | Admitting: Family Medicine

## 2013-07-07 NOTE — Telephone Encounter (Signed)
Med filled.  

## 2013-07-08 ENCOUNTER — Ambulatory Visit (INDEPENDENT_AMBULATORY_CARE_PROVIDER_SITE_OTHER): Payer: Medicare Other | Admitting: Family Medicine

## 2013-07-08 ENCOUNTER — Encounter: Payer: Self-pay | Admitting: Family Medicine

## 2013-07-08 VITALS — BP 122/74 | HR 54 | Temp 98.4°F | Resp 16 | Wt 146.1 lb

## 2013-07-08 DIAGNOSIS — I10 Essential (primary) hypertension: Secondary | ICD-10-CM

## 2013-07-08 DIAGNOSIS — E785 Hyperlipidemia, unspecified: Secondary | ICD-10-CM

## 2013-07-08 LAB — BASIC METABOLIC PANEL
BUN: 17 mg/dL (ref 6–23)
CO2: 32 mEq/L (ref 19–32)
Chloride: 100 mEq/L (ref 96–112)
Creatinine, Ser: 0.9 mg/dL (ref 0.4–1.2)
Glucose, Bld: 77 mg/dL (ref 70–99)

## 2013-07-08 LAB — LIPID PANEL
Cholesterol: 157 mg/dL (ref 0–200)
Triglycerides: 108 mg/dL (ref 0.0–149.0)
VLDL: 21.6 mg/dL (ref 0.0–40.0)

## 2013-07-08 LAB — HEPATIC FUNCTION PANEL
ALT: 14 U/L (ref 0–35)
AST: 23 U/L (ref 0–37)
Albumin: 3.7 g/dL (ref 3.5–5.2)
Total Protein: 6.4 g/dL (ref 6.0–8.3)

## 2013-07-08 NOTE — Assessment & Plan Note (Signed)
Chronic problem.  Well controlled.  Asymptomatic.  No med changes.

## 2013-07-08 NOTE — Patient Instructions (Signed)
Schedule your complete physical in 6 months We'll notify you of your lab results and make any changes if needed Keep up the good work!  You look great!!! Call with any questions or concerns Happy Holidays!! 

## 2013-07-08 NOTE — Assessment & Plan Note (Signed)
Chronic problem.  Tolerating statin w/out difficulty.  Check labs.  Adjust meds prn  

## 2013-07-08 NOTE — Progress Notes (Signed)
   Subjective:    Patient ID: Wanda Wright, female    DOB: 05-29-1929, 79 y.o.   MRN: [id number]  HPI Pre visit review using our clinic review tool, if applicable. No additional management support is needed unless otherwise documented below in the visit note.  HTN- chronic problem, on Chlorthalidone.  No CP, SOB, HAs, visual changes, edema.  Hyperlipidemia- chronic problem, on Lipitor.  Denies abd pain, N/V, myalgias.   Review of Systems For ROS see HPI     Objective:   Physical Exam  Vitals reviewed. Constitutional: She is oriented to person, place, and time. She appears well-developed and well-nourished. No distress.  HENT:  Head: Normocephalic and atraumatic.  Eyes: Conjunctivae and EOM are normal. Pupils are equal, round, and reactive to light.  Neck: Normal range of motion. Neck supple. No thyromegaly present.  Cardiovascular: Normal rate, regular rhythm, normal heart sounds and intact distal pulses.   No murmur heard. Pulmonary/Chest: Effort normal and breath sounds normal. No respiratory distress.  Abdominal: Soft. She exhibits no distension. There is no tenderness.  Musculoskeletal: She exhibits no edema.  Lymphadenopathy:    She has no cervical adenopathy.  Neurological: She is alert and oriented to person, place, and time.  Skin: Skin is warm and dry.  Psychiatric: She has a normal mood and affect. Her behavior is normal.          Assessment & Plan:

## 2013-07-25 ENCOUNTER — Telehealth: Payer: Self-pay | Admitting: *Deleted

## 2013-07-25 NOTE — Telephone Encounter (Signed)
Regular is typically ok for most pts unless she has a concern and then I would recommend the other

## 2013-07-25 NOTE — Telephone Encounter (Signed)
Pt.notified

## 2013-07-25 NOTE — Telephone Encounter (Signed)
Patient called stating that she is about to schedule her mammogram and wanted to see if dr Beverely Low recommends her get the regular mammogram or the 3D digital.

## 2013-07-29 ENCOUNTER — Other Ambulatory Visit: Payer: Self-pay

## 2013-07-29 DIAGNOSIS — Z1231 Encounter for screening mammogram for malignant neoplasm of breast: Secondary | ICD-10-CM

## 2013-08-15 ENCOUNTER — Encounter: Payer: Self-pay | Admitting: Cardiology

## 2013-08-15 ENCOUNTER — Ambulatory Visit (INDEPENDENT_AMBULATORY_CARE_PROVIDER_SITE_OTHER): Payer: Medicare HMO | Admitting: Cardiology

## 2013-08-15 VITALS — BP 122/80 | HR 60 | Ht 61.75 in | Wt 144.0 lb

## 2013-08-15 DIAGNOSIS — R001 Bradycardia, unspecified: Secondary | ICD-10-CM

## 2013-08-15 DIAGNOSIS — I2510 Atherosclerotic heart disease of native coronary artery without angina pectoris: Secondary | ICD-10-CM

## 2013-08-15 DIAGNOSIS — I498 Other specified cardiac arrhythmias: Secondary | ICD-10-CM

## 2013-08-15 DIAGNOSIS — E785 Hyperlipidemia, unspecified: Secondary | ICD-10-CM

## 2013-08-15 DIAGNOSIS — I10 Essential (primary) hypertension: Secondary | ICD-10-CM

## 2013-08-15 NOTE — Progress Notes (Signed)
HPI  Patient is seen today to followup coronary disease. I saw her last January, 2014. She's post CABG in 2004. Catheterization 2009 revealed patent grafts. She is active. She walks on a regular basis. She does not have chest pain or shortness of breath. There's been no syncope or presyncope. She's taking her aspirin and a cholesterol medication. She does have some discomfort in her shoulder for which she gets steroid injections. This does not sound like angina.  Allergies  Allergen Reactions  . Codeine     REACTION: Elevated Heart rate  . Horse-Derived Products   . Tetanus Toxoid     Current Outpatient Prescriptions  Medication Sig Dispense Refill  . ALPRAZolam (XANAX) 0.25 MG tablet       . aspirin 81 MG tablet Take 81 mg by mouth daily.        Marland Kitchen atorvastatin (LIPITOR) 20 MG tablet TAKE 1 TABLET BY MOUTH AS DIRECTED  90 tablet  0  . Calcium Carbonate-Vitamin D 600-400 MG-UNIT per tablet Take 2 tablets by mouth daily.      . chlorthalidone (HYGROTON) 25 MG tablet TAKE AS DIRECTED  30 tablet  5  . cholecalciferol (VITAMIN D) 1000 UNITS tablet Take 1,000 Units by mouth daily.      Marland Kitchen donepezil (ARICEPT) 10 MG tablet TAKE 1 TABLET BY MOUTH AT BEDTIME  30 tablet  4  . fluticasone (FLONASE) 50 MCG/ACT nasal spray Place 2 sprays into the nose daily.  16 g  6  . loratadine (KLS ALLERCLEAR) 10 MG tablet Take 10 mg by mouth daily.      . naproxen sodium (ALEVE) 220 MG tablet Take 220 mg by mouth 2 (two) times daily with a meal.      . nitroGLYCERIN (NITROSTAT) 0.4 MG SL tablet Place 1 tablet (0.4 mg total) under the tongue as needed.  30 tablet  6  . Polyethylene Glycol 3350 (MIRALAX PO) Take by mouth as needed.        . potassium chloride (K-DUR,KLOR-CON) 10 MEQ tablet TAKE 1 TABLET BY MOUTH EVERY DAY  30 tablet  2  . traZODone (DESYREL) 50 MG tablet Take 25 mg by mouth at bedtime as needed.       . gabapentin (NEURONTIN) 100 MG capsule TAKE 1 TABLET BY MOUTH EVERY DAY  30 capsule  3   No  current facility-administered medications for this visit.    History   Social History  . Marital Status: Married    Spouse Name: N/A    Number of Children: N/A  . Years of Education: N/A   Occupational History  . Not on file.   Social History Main Topics  . Smoking status: Former Research scientist (life sciences)  . Smokeless tobacco: Not on file  . Alcohol Use: No  . Drug Use: No  . Sexual Activity:    Other Topics Concern  . Not on file   Social History Narrative   Widowed 2000.    Family History  Problem Relation Age of Onset  . Heart attack Father   . Stroke Father   . Dementia Mother   . Breast cancer Mother     Past Medical History  Diagnosis Date  . 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 Hyperlipidemia   . Hypertension   . Hemidiaphragm     elevated hemidiaphragm  . Colon polyps   .  Osteoarthritis cervical spine   . Hearing loss   . Allergic rhinitis   . Carotid artery disease     Doppler, October, 2011, 0-39% bilateral  . Colon polyp   . Hx of CABG     2004  . Ejection fraction     normal, catheterization, 2009  . Bradycardia     January, 2013    Past Surgical History  Procedure Laterality Date  . Total abdominal hysterectomy    . Hiatal hernia repair    . Coronary artery bypass graft      ACS  . Cholecystectomy    . Breast lumpectomy      multiple, all benign  . Colonoscopy w/ polypectomy      Patient Active Problem List   Diagnosis Date Noted  . Bradycardia     Priority: High  . CAD (coronary artery disease)     Priority: High  . Hyperlipidemia     Priority: High  . Hypertension     Priority: High  . Carotid artery disease     Priority: High  . Hx of CABG     Priority: High  . Ejection fraction     Priority: High  . Routine general medical examination at a health care facility 01/03/2013  . Keratosis, seborrheic 03/28/2012  . Breast pain  02/14/2012  . Pectoralis muscle strain 02/14/2012  . Sinusitis, acute 12/20/2011  . Cherry angioma 12/05/2011  . Insomnia 12/05/2011  . Neuropathy of leg 09/05/2011  . Hemidiaphragm   . OM (otitis media) 07/19/2011  . Leg cramps 04/12/2011  . Sweating 04/12/2011  . Flushing 04/12/2011  . Constipation 04/06/2011  . Memory loss 02/10/2011  . OTHER&UNSPECIFIED DISEASES THE ORAL SOFT TISSUES 08/17/2010  . NEOPLASM, SKIN, UNCERTAIN BEHAVIOR 01/31/9484  . GAIT IMBALANCE 08/10/2010  . DYSPHAGIA 08/10/2010  . DEPRESSIVE DISORDER 06/13/2010  . COLONIC POLYPS 11/13/2008  . SEBACEOUS CYST, SCALP 10/05/2008  . OSTEOARTHRITIS, CERVICAL SPINE 10/05/2008  . DISEASE, HYPERTENSIVE HEART NOS, W/O HF 04/18/2007  . ALLERGIC RHINITIS 03/14/2007  . OSTEOPOROSIS 03/14/2007    ROS   Patient denies fever, chills, headache, sweats, rash, change in vision, change in hearing, chest pain, cough, nausea or vomiting, urinary symptoms. All other systems are reviewed and are negative.  PHYSICAL EXAM  Patient is oriented to person time and place. Affect is normal. She's here with a family member. There is no jugulovenous distention. Lungs are clear. Respiratory effort is nonlabored. Cardiac exam reveals S1 and S2. There no clicks or significant murmurs. The abdomen is soft. There is no peripheral edema. There no musculoskeletal deformities. There are no skin rashes.  Filed Vitals:   08/15/13 1350  BP: 122/80  Pulse: 60  Height: 5' 1.75" (1.568 m)  Weight: 144 lb (65.318 kg)   EKG is done today and reviewed by me. There is sinus bradycardia. There no abnormalities of the QRS.  ASSESSMENT & PLAN

## 2013-08-15 NOTE — Assessment & Plan Note (Signed)
Blood pressure is controlled. No change in therapy. 

## 2013-08-15 NOTE — Assessment & Plan Note (Signed)
Patient is on a statin and has an LDL of 74. I have reviewed guidelines. I feel that there is no need to change medications in her case.

## 2013-08-15 NOTE — Assessment & Plan Note (Signed)
The patient is not having any symptomatic bradycardia. No further workup.

## 2013-08-15 NOTE — Assessment & Plan Note (Signed)
Coronary disease is stable. The patient has no symptoms. She is active. She has not had a stress test in many years. However I feel this is not necessary at this time. No further workup is needed. She is on all the appropriate medications.

## 2013-08-15 NOTE — Patient Instructions (Signed)
Your physician recommends that you continue on your current medications as directed. Please refer to the Current Medication list given to you today.  Your physician wants you to follow-up in: 1 year. You will receive a reminder letter in the mail two months in advance. If you don't receive a letter, please call our office to schedule the follow-up appointment.  

## 2013-08-19 ENCOUNTER — Telehealth: Payer: Self-pay | Admitting: Family Medicine

## 2013-08-19 NOTE — Telephone Encounter (Addendum)
Referral to Dr. Mardelle Matte faxed to [facility]. Awaiting approval.

## 2013-08-26 ENCOUNTER — Telehealth: Payer: Self-pay | Admitting: *Deleted

## 2013-08-26 NOTE — Telephone Encounter (Signed)
Patient called and stated that she needs referrals to see dr Norva Riffle each time for a  2 month cortisone shot. A referral to [facility] ophthalmologist and to see Dr Orpah Greek bates who she saw today.

## 2013-08-28 NOTE — Telephone Encounter (Signed)
Need name of ophthalmologist. Left message for patient to call office.

## 2013-08-28 NOTE — Telephone Encounter (Signed)
Referrals faxed for Dr. Mardelle Matte and Dr. Redmond Baseman.

## 2013-09-03 ENCOUNTER — Other Ambulatory Visit: Payer: Self-pay | Admitting: *Deleted

## 2013-09-03 MED ORDER — NITROGLYCERIN 0.4 MG SL SUBL: 0.4000 mg | SUBLINGUAL_TABLET | SUBLINGUAL | Status: DC | PRN

## 2013-09-16 ENCOUNTER — Ambulatory Visit
Admission: RE | Admit: 2013-09-16 | Discharge: 2013-09-16 | Disposition: A | Discharge status: Home / Self Care | Payer: Commercial Managed Care - HMO | Source: Ambulatory Visit

## 2013-09-16 DIAGNOSIS — Z1231 Encounter for screening mammogram for malignant neoplasm of breast: Secondary | ICD-10-CM

## 2013-09-22 ENCOUNTER — Telehealth: Payer: Self-pay | Admitting: Family Medicine

## 2013-09-22 NOTE — Telephone Encounter (Signed)
Caller: Deborah/Patient; PCP: Midge Minium.; CB#: [phone]; Call regarding Lower Back Pain this morning. Pain was sharp and radiated down her buttocks and into side of R leg- onset 1000. Back and leg still hurts with weight bearing and walking.  Last had bm this morning but only small pieces. She took dose of Miralax this morning- last had normal bm one week ago. She has been straining to have a bm daily for last week. She took Tylenol 500 mgs 2 tabs PO at 0830 and at 1545 and sx improved some - using heat pad on lower back and side of leg and helps some. Afebrile. Triage and Care advice per Back Symptoms Protocol and appointment advised within 24 hours for "Back pain radiates into thigh or below knee". Advised to call office in the AM on 09/23/13 to see if she can be worked into schedule or if she can be seen at another [facility] office. She is willing to see Dr. Linna Darner at [place].

## 2013-09-23 ENCOUNTER — Emergency Department (HOSPITAL_BASED_OUTPATIENT_CLINIC_OR_DEPARTMENT_OTHER)
Admission: EM | Admit: 2013-09-23 | Discharge: 2013-09-23 | Disposition: A | Discharge status: Home / Self Care | Payer: Medicare HMO | Attending: Emergency Medicine | Admitting: Emergency Medicine

## 2013-09-23 ENCOUNTER — Encounter (HOSPITAL_BASED_OUTPATIENT_CLINIC_OR_DEPARTMENT_OTHER): Payer: Self-pay | Admitting: Emergency Medicine

## 2013-09-23 ENCOUNTER — Emergency Department (HOSPITAL_BASED_OUTPATIENT_CLINIC_OR_DEPARTMENT_OTHER): Payer: Medicare HMO

## 2013-09-23 DIAGNOSIS — Z8709 Personal history of other diseases of the respiratory system: Secondary | ICD-10-CM | POA: Insufficient documentation

## 2013-09-23 DIAGNOSIS — E785 Hyperlipidemia, unspecified: Secondary | ICD-10-CM | POA: Insufficient documentation

## 2013-09-23 DIAGNOSIS — I10 Essential (primary) hypertension: Secondary | ICD-10-CM | POA: Insufficient documentation

## 2013-09-23 DIAGNOSIS — IMO0002 Reserved for concepts with insufficient information to code with codable children: Secondary | ICD-10-CM | POA: Insufficient documentation

## 2013-09-23 DIAGNOSIS — Z951 Presence of aortocoronary bypass graft: Secondary | ICD-10-CM | POA: Insufficient documentation

## 2013-09-23 DIAGNOSIS — Z87891 Personal history of nicotine dependence: Secondary | ICD-10-CM | POA: Insufficient documentation

## 2013-09-23 DIAGNOSIS — Z8669 Personal history of other diseases of the nervous system and sense organs: Secondary | ICD-10-CM | POA: Insufficient documentation

## 2013-09-23 DIAGNOSIS — Z860100 Personal history of colon polyps, unspecified: Secondary | ICD-10-CM | POA: Insufficient documentation

## 2013-09-23 DIAGNOSIS — I8390 Asymptomatic varicose veins of unspecified lower extremity: Secondary | ICD-10-CM | POA: Insufficient documentation

## 2013-09-23 DIAGNOSIS — Z79899 Other long term (current) drug therapy: Secondary | ICD-10-CM | POA: Insufficient documentation

## 2013-09-23 DIAGNOSIS — M5431 Sciatica, right side: Secondary | ICD-10-CM

## 2013-09-23 DIAGNOSIS — I2510 Atherosclerotic heart disease of native coronary artery without angina pectoris: Secondary | ICD-10-CM | POA: Insufficient documentation

## 2013-09-23 DIAGNOSIS — M47812 Spondylosis without myelopathy or radiculopathy, cervical region: Secondary | ICD-10-CM | POA: Insufficient documentation

## 2013-09-23 DIAGNOSIS — M549 Dorsalgia, unspecified: Secondary | ICD-10-CM

## 2013-09-23 DIAGNOSIS — Z7982 Long term (current) use of aspirin: Secondary | ICD-10-CM | POA: Insufficient documentation

## 2013-09-23 DIAGNOSIS — Z791 Long term (current) use of non-steroidal anti-inflammatories (NSAID): Secondary | ICD-10-CM | POA: Insufficient documentation

## 2013-09-23 DIAGNOSIS — M5430 Sciatica, unspecified side: Secondary | ICD-10-CM | POA: Insufficient documentation

## 2013-09-23 LAB — URINE MICROSCOPIC-ADD ON

## 2013-09-23 LAB — URINALYSIS, ROUTINE W REFLEX MICROSCOPIC
GLUCOSE, UA: NEGATIVE mg/dL
Hgb urine dipstick: NEGATIVE
KETONES UR: 15 mg/dL — AB
NITRITE: NEGATIVE
Protein, ur: NEGATIVE mg/dL
Specific Gravity, Urine: 1.029 (ref 1.005–1.030)
Urobilinogen, UA: 1 mg/dL (ref 0.0–1.0)
pH: 8 (ref 5.0–8.0)

## 2013-09-23 MED ORDER — TRAMADOL HCL 50 MG PO TABS: 50.0000 mg | ORAL_TABLET | Freq: Four times a day (QID) | ORAL | Status: DC | PRN

## 2013-09-23 MED ORDER — HYDROCODONE-ACETAMINOPHEN 5-325 MG PO TABS
1.0000 | ORAL_TABLET | Freq: Four times a day (QID) | ORAL | Status: DC | PRN
Start: 2013-09-23 — End: 2013-10-16

## 2013-09-23 NOTE — ED Provider Notes (Signed)
CSN: [id number]     Arrival date & time 09/23/13  1147 History   First MD Initiated Contact with Patient 09/23/13 1212     Chief Complaint  Patient presents with  . Back Pain     (Consider location/radiation/quality/duration/timing/severity/associated sxs/prior Treatment) Patient is a 80 y.o. female presenting with back pain. The history is provided by the patient.  Back Pain Associated symptoms: no abdominal pain, no chest pain, no dysuria, no fever, no headaches, no numbness and no weakness    patient with acute onset of right lower back pain yesterday no fall or injury. Radiates down the back of her leg to her foot. Pain described as throbbing 8/10 some relief the heating pad. No history of similar pain. No numbness or weakness in the foot. Improve some with rest made worse by walking.  Past Medical History  Diagnosis Date  . 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 Hyperlipidemia   . Hypertension   . Hemidiaphragm     elevated hemidiaphragm  . Colon polyps   . Osteoarthritis cervical spine   . Hearing loss   . Allergic rhinitis   . Carotid artery disease     Doppler, October, 2011, 0-39% bilateral  . Colon polyp   . Hx of CABG     2004  . Ejection fraction     normal, catheterization, 2009  . Bradycardia     January, 2013   Past Surgical History  Procedure Laterality Date  . Total abdominal hysterectomy    . Hiatal hernia repair    . Coronary artery bypass graft      ACS  . Cholecystectomy    . Breast lumpectomy      multiple, all benign  . Colonoscopy w/ polypectomy     Family History  Problem Relation Age of Onset  . Heart attack Father   . Stroke Father   . Dementia Mother   . Breast cancer Mother    History  Substance Use Topics  . Smoking status: Former Research scientist (life sciences)  . Smokeless tobacco: Not on file  . Alcohol Use: No   OB History   Grav Para  Term Preterm Abortions TAB SAB Ect Mult Living                 Review of Systems  Constitutional: Negative for fever.  HENT: Negative for congestion.   Eyes: Negative for redness.  Respiratory: Negative for shortness of breath.   Cardiovascular: Negative for chest pain.  Gastrointestinal: Negative for abdominal pain.  Genitourinary: Negative for dysuria.  Musculoskeletal: Positive for back pain.  Skin: Negative for rash.  Neurological: Negative for weakness, numbness and headaches.  Hematological: Does not bruise/bleed easily.  Psychiatric/Behavioral: Negative for confusion.      Allergies  Codeine; Horse-derived products; and Tetanus toxoid  Home Medications   Current Outpatient Rx  Name  Route  Sig  Dispense  Refill  . ALPRAZolam (XANAX) 0.25 MG tablet               . aspirin 81 MG tablet   Oral   Take 81 mg by mouth daily.           Marland Kitchen atorvastatin (LIPITOR) 20 MG tablet      TAKE 1 TABLET BY MOUTH AS DIRECTED   90 tablet   0   . Calcium Carbonate-Vitamin D 600-400 MG-UNIT per  tablet   Oral   Take 2 tablets by mouth daily.         . chlorthalidone (HYGROTON) 25 MG tablet      TAKE AS DIRECTED   30 tablet   5   . cholecalciferol (VITAMIN D) 1000 UNITS tablet   Oral   Take 1,000 Units by mouth daily.         Marland Kitchen donepezil (ARICEPT) 10 MG tablet      TAKE 1 TABLET BY MOUTH AT BEDTIME   30 tablet   4   . fluticasone (FLONASE) 50 MCG/ACT nasal spray   Nasal   Place 2 sprays into the nose daily.   16 g   6   . gabapentin (NEURONTIN) 100 MG capsule      TAKE 1 TABLET BY MOUTH EVERY DAY   30 capsule   3   . HYDROcodone-acetaminophen (NORCO/VICODIN) 5-325 MG per tablet   Oral   Take 1-2 tablets by mouth every 6 (six) hours as needed for moderate pain.   20 tablet   0   . loratadine (KLS ALLERCLEAR) 10 MG tablet   Oral   Take 10 mg by mouth daily.         . naproxen sodium (ALEVE) 220 MG tablet   Oral   Take 220 mg by mouth 2 (two)  times daily with a meal.         . nitroGLYCERIN (NITROSTAT) 0.4 MG SL tablet   Sublingual   Place 1 tablet (0.4 mg total) under the tongue as needed.   30 tablet   6   . Polyethylene Glycol 3350 (MIRALAX PO)   Oral   Take by mouth as needed.           . potassium chloride (K-DUR,KLOR-CON) 10 MEQ tablet      TAKE 1 TABLET BY MOUTH EVERY DAY   30 tablet   2   . traMADol (ULTRAM) 50 MG tablet   Oral   Take 1 tablet (50 mg total) by mouth every 6 (six) hours as needed.   20 tablet   0   . traZODone (DESYREL) 50 MG tablet   Oral   Take 25 mg by mouth at bedtime as needed.           BP 156/74  Pulse 55  Temp(Src) 97.8 F (36.6 C) (Oral)  Resp 18  Wt 144 lb (65.318 kg)  SpO2 98% Physical Exam  Nursing note and vitals reviewed. Constitutional: She is oriented to person, place, and time. She appears well-developed and well-nourished. No distress.  HENT:  Head: Normocephalic and atraumatic.  Mouth/Throat: Oropharynx is clear and moist.  Eyes: Conjunctivae and EOM are normal. Pupils are equal, round, and reactive to light.  Neck: Normal range of motion.  Cardiovascular: Normal rate, regular rhythm and normal heart sounds.   No murmur heard. Pulmonary/Chest: Effort normal and breath sounds normal. No respiratory distress.  Abdominal: Soft. Bowel sounds are normal. There is no tenderness.  Musculoskeletal: She exhibits tenderness.  Mild tenderness to palpation in the right lumbar area. No hip tenderness. Patient with the verrucous veins in both legs right greater than left. No foot numbness or weakness.  Neurological: She is alert and oriented to person, place, and time. No cranial nerve deficit. She exhibits normal muscle tone. Coordination normal.  Skin: Skin is warm. No rash noted.    ED Course  Procedures (including critical care time) Labs Review Labs Reviewed  URINALYSIS, ROUTINE W REFLEX  MICROSCOPIC - Abnormal; Notable for the following:    Bilirubin Urine  SMALL (*)    Ketones, ur 15 (*)    Leukocytes, UA TRACE (*)    All other components within normal limits  URINE MICROSCOPIC-ADD ON - Abnormal; Notable for the following:    Bacteria, UA FEW (*)    Casts HYALINE CASTS (*)    All other components within normal limits   Imaging Review Dg Lumbar Spine Complete  09/23/2013   CLINICAL DATA:  Low back and right hip pain.  EXAM: LUMBAR SPINE - COMPLETE 4+ VIEW  COMPARISON:  09/23/2005.  FINDINGS: Five non-rib-bearing lumbar vertebrae. Facet degenerative changes in the lower lumbar spine with associated interval 4 mm of anterolisthesis at the L4-5 level. No significant change in 3 mm of retrolisthesis at the L2-3 level. No fractures or pars defects. Atheromatous arterial calcifications. Cholecystectomy clips.  IMPRESSION: Facet degenerative changes in the lower lumbar spine with associated interval grade 1 anterolisthesis at the L4-5 level.   Electronically Signed   By: Enrique Sack M.D.   On: 09/23/2013 13:19   Dg Hip Bilateral W/pelvis  09/23/2013   CLINICAL DATA:  Right back and leg pain.  No known injury.  EXAM: BILATERAL HIP WITH PELVIS - 4+ VIEW  COMPARISON:  None.  FINDINGS: Normal appearing pelvic bones and hips. Lower lumbar spine degenerative changes.  IMPRESSION: No acute abnormality.   Electronically Signed   By: Enrique Sack M.D.   On: 09/23/2013 13:17    EKG Interpretation   None       MDM   Final diagnoses:  Back pain  Sciatica of right side    Symptoms consistent with right-sided sciatica. X-rays of the lumbar area without any bony fractures or compression fractures. There is degenerative changes there. X-rays of both hips without significant findings. We'll treat with pain medication in the meantime if symptoms persist MRI would be appropriate.    Mervin Kung, MD 09/23/13 1350

## 2013-09-23 NOTE — Discharge Instructions (Signed)
Tramadol as directed. Since you're not able to remember exactly what codeine did to you and most likely since it was many many years ago it was natural codeine we'll try you on synthetic codeine hydrocodone as needed for breakthrough pain. Make appointment to followup with your Dr. If things do not improve in a week and consideration for MRI would be pertinent. Return for any new or worse symptoms.

## 2013-09-23 NOTE — ED Notes (Signed)
Pt. Just returning from radiology.

## 2013-09-23 NOTE — ED Notes (Signed)
Sharp lower back pain since yesterday. Pain radiates into her right leg. Throbbing pain with movement. Relief with heating pad. No known injury.

## 2013-09-24 ENCOUNTER — Telehealth: Payer: Self-pay | Admitting: *Deleted

## 2013-09-24 NOTE — Telephone Encounter (Signed)
[facility] Triage Call Report Triage Record Num: [id number] Operator: Wells Guiles Patient Name: Wanda Wright Call Date & Time: 09/22/2013 4:33:06PM Patient Phone: [phone] PCP: Annye Asa ([facility]) Patient Gender: Female PCP Fax : Patient DOB: [birth year] Practice Name: [facility] Reason for Call: Caller: Deborah/Patient; PCP: Midge Minium.; CB#: [phone]; Call regarding Lower Back Pain this morning. Pain was sharp and radiated down her buttocks and into side of R leg- onset 1000. Back and leg still hurts with weight bearing and walking. Last had bm this morning but only small pieces. She took dose of Miralax this morning- last had normal bm one week ago. She has been straining to have a bm daily for last week. She took Tylenol 500 mgs 2 tabs PO at 0830 and at 1545 and sx improved some - using heat pad on lower back and side of leg and helps some. Afebrile. Triage and Care advice per Back Symptoms Protocol and appointment advised within 24 hours for "Back pain radiates into thigh or below knee". Advised to call office in the AM on 09/23/13 to see if she can be worked into schedule or if she can be seen at another [facility] office. Protocol(s) Used: Back Symptoms Recommended Outcome per Protocol: See Provider within 24 hours Reason for Outcome: Back pain radiates into thigh or below knee Care Advice: ~

## 2013-09-24 NOTE — Telephone Encounter (Signed)
Appt made with Dr. Birdie Riddle per pt and dtr request

## 2013-09-24 NOTE — Telephone Encounter (Signed)
Pt needs appointment

## 2013-09-24 NOTE — Telephone Encounter (Signed)
Spoke with pt who states med given in ED is providing relief. F/U appt made

## 2013-10-01 ENCOUNTER — Ambulatory Visit (INDEPENDENT_AMBULATORY_CARE_PROVIDER_SITE_OTHER): Payer: Commercial Managed Care - HMO | Admitting: Family Medicine

## 2013-10-01 ENCOUNTER — Encounter: Payer: Self-pay | Admitting: Family Medicine

## 2013-10-01 VITALS — BP 130/78 | HR 61 | Temp 98.1°F | Resp 16 | Wt 143.4 lb

## 2013-10-01 DIAGNOSIS — M5431 Sciatica, right side: Secondary | ICD-10-CM | POA: Insufficient documentation

## 2013-10-01 DIAGNOSIS — M5430 Sciatica, unspecified side: Secondary | ICD-10-CM

## 2013-10-01 MED ORDER — TRAMADOL HCL 50 MG PO TABS: 50.0000 mg | ORAL_TABLET | Freq: Four times a day (QID) | ORAL | Status: DC | PRN

## 2013-10-01 MED ORDER — PREDNISONE 10 MG PO TABS: ORAL_TABLET | ORAL | Status: DC

## 2013-10-01 NOTE — Patient Instructions (Signed)
Follow up as needed Continue the tylenol and tramadol as needed Take the Prednisone as directed Alternate heat/ice Call with any questions or concerns Hang in there!

## 2013-10-01 NOTE — Progress Notes (Signed)
   Subjective:    Patient ID: Wanda Wright, female    DOB: 03/27/1929, 80 y.o.   MRN: [id number]  HPI ER F/U- pt went to ER on 2/17 and dx'd w/ sciatica.  Was given hydrocodone and Tramadol in the ER.  Pain is R sided and radiates from buttock into thigh.  Has subjective weakness in R leg.  No numbness.  Had imaging done and showed degenerative changes in L4-5.  Pt is not taking hydrocodone b/c it interferes w/ her usually scheduled tylenol and she has hx of past allergy to codeine.  Pt was feeling better on Tramadol but ran out yesterday and is again having pain.  Also having pain over R greater trochanter   Review of Systems For ROS see HPI     Objective:   Physical Exam  Vitals reviewed. Constitutional: She is oriented to person, place, and time. She appears well-developed and well-nourished. No distress.  Cardiovascular: Intact distal pulses.   Musculoskeletal: She exhibits tenderness (over R greater trochanteric bursa). She exhibits no edema.  Neurological: She is alert and oriented to person, place, and time. She has normal reflexes. Coordination normal.  (-) SLR Strength symmetric and equal in hip flexors, quads  Skin: Skin is warm and dry. No erythema.          Assessment & Plan:

## 2013-10-01 NOTE — Assessment & Plan Note (Signed)
New.  Pt was doing well w/ tramadol- will refill.  Due to pain over R greater trochanter, suspect bursitis.  Start Pred taper.  Reviewed supportive care and red flags that should prompt return.  Pt expressed understanding and is in agreement w/ plan.

## 2013-10-01 NOTE — Progress Notes (Signed)
Pre visit review using our clinic review tool, if applicable. No additional management support is needed unless otherwise documented below in the visit note. 

## 2013-10-11 ENCOUNTER — Other Ambulatory Visit: Payer: Self-pay | Admitting: Family Medicine

## 2013-10-13 NOTE — Telephone Encounter (Signed)
Med filled.  

## 2013-10-15 ENCOUNTER — Telehealth: Payer: Self-pay | Admitting: General Practice

## 2013-10-15 MED ORDER — TRAMADOL HCL 50 MG PO TABS: 50.0000 mg | ORAL_TABLET | Freq: Four times a day (QID) | ORAL | Status: DC | PRN

## 2013-10-15 NOTE — Telephone Encounter (Signed)
Pt daughter came into the office today stating that she called the office for the past two days and left a message on the triage line in regards to getting her mom's tramadol filled.  Pt states that she figured she would come into get the medication since that is the only way to get a hold of Dr. Birdie Riddle. Pt daughter was given my direct extension to alleviate this issue in the future.   Med filled #60 with 3 refills.

## 2013-10-16 ENCOUNTER — Encounter (HOSPITAL_COMMUNITY): Payer: Self-pay | Admitting: Emergency Medicine

## 2013-10-16 ENCOUNTER — Emergency Department (HOSPITAL_COMMUNITY): Payer: Medicare HMO

## 2013-10-16 ENCOUNTER — Emergency Department (HOSPITAL_COMMUNITY)
Admission: EM | Admit: 2013-10-16 | Discharge: 2013-10-17 | Disposition: A | Discharge status: Home / Self Care | Payer: Medicare HMO | Attending: Emergency Medicine | Admitting: Emergency Medicine

## 2013-10-16 DIAGNOSIS — Z87891 Personal history of nicotine dependence: Secondary | ICD-10-CM | POA: Insufficient documentation

## 2013-10-16 DIAGNOSIS — Z951 Presence of aortocoronary bypass graft: Secondary | ICD-10-CM | POA: Insufficient documentation

## 2013-10-16 DIAGNOSIS — I2510 Atherosclerotic heart disease of native coronary artery without angina pectoris: Secondary | ICD-10-CM | POA: Insufficient documentation

## 2013-10-16 DIAGNOSIS — Z79899 Other long term (current) drug therapy: Secondary | ICD-10-CM | POA: Insufficient documentation

## 2013-10-16 DIAGNOSIS — Z7982 Long term (current) use of aspirin: Secondary | ICD-10-CM | POA: Insufficient documentation

## 2013-10-16 DIAGNOSIS — R0789 Other chest pain: Secondary | ICD-10-CM

## 2013-10-16 DIAGNOSIS — H9190 Unspecified hearing loss, unspecified ear: Secondary | ICD-10-CM | POA: Insufficient documentation

## 2013-10-16 DIAGNOSIS — M47812 Spondylosis without myelopathy or radiculopathy, cervical region: Secondary | ICD-10-CM | POA: Insufficient documentation

## 2013-10-16 DIAGNOSIS — M25519 Pain in unspecified shoulder: Secondary | ICD-10-CM | POA: Insufficient documentation

## 2013-10-16 DIAGNOSIS — Z860100 Personal history of colon polyps, unspecified: Secondary | ICD-10-CM | POA: Insufficient documentation

## 2013-10-16 DIAGNOSIS — I10 Essential (primary) hypertension: Secondary | ICD-10-CM | POA: Insufficient documentation

## 2013-10-16 DIAGNOSIS — I252 Old myocardial infarction: Secondary | ICD-10-CM | POA: Insufficient documentation

## 2013-10-16 DIAGNOSIS — R071 Chest pain on breathing: Secondary | ICD-10-CM | POA: Insufficient documentation

## 2013-10-16 DIAGNOSIS — E785 Hyperlipidemia, unspecified: Secondary | ICD-10-CM | POA: Insufficient documentation

## 2013-10-16 MED ORDER — OXYCODONE-ACETAMINOPHEN 5-325 MG PO TABS
1.0000 | ORAL_TABLET | Freq: Once | ORAL | Status: AC
  Administered 2013-10-17: 1 via ORAL
  Filled 2013-10-16: qty 1

## 2013-10-16 NOTE — ED Notes (Signed)
MD at bedside. 

## 2013-10-16 NOTE — ED Notes (Signed)
Per [facility] - pt from home, admits to doing yard work a few days ago and has been experiencing left rib and left shoulder pain not relieved by heat packs or her rx pain medications. Pt w/ hx of MI and CABG however states this pain does not feel the same and pain is sharp and shooting only w/ movement. Pt also admits to hx of arthritis in the left shoulder to which pt has steroid injections occasionally, last injection approx 25mo ago. Pt denies shortness of breath, n/v or any other associating sx.

## 2013-10-16 NOTE — ED Provider Notes (Signed)
CSN: [id number]     Arrival date & time 10/16/13  2135 History   First MD Initiated Contact with Patient 10/16/13 2322     Chief Complaint  Patient presents with  . Shoulder Pain  . Chest Pain     (Consider location/radiation/quality/duration/timing/severity/associated sxs/prior Treatment) HPI  This patient is an 80 year old woman with multiple chronic medical problems including cardiovascular disease-history of CABG 2004, history of MI, hypertension.  She presents with complaints of left chest tenderness and pain. Her symptoms began 2-3 days ago. Her symptoms began the day after she spent several hours in her yard pulling weeds in picking up sticks. The patient denies any associated shortness of breath or cough. No fever. She denies history of similar symptoms. He describes a sharp, sore feeling in the chest which is moderate in severity and nonradiating.  Past Medical History  Diagnosis Date  . 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 Hyperlipidemia   . Hypertension   . Hemidiaphragm     elevated hemidiaphragm  . Colon polyps   . Osteoarthritis cervical spine   . Hearing loss   . Allergic rhinitis   . Carotid artery disease     Doppler, October, 2011, 0-39% bilateral  . Colon polyp   . Hx of CABG     2004  . Ejection fraction     normal, catheterization, 2009  . Bradycardia     January, 2013   Past Surgical History  Procedure Laterality Date  . Total abdominal hysterectomy    . Hiatal hernia repair    . Coronary artery bypass graft      ACS  . Cholecystectomy    . Breast lumpectomy      multiple, all benign  . Colonoscopy w/ polypectomy     Family History  Problem Relation Age of Onset  . Heart attack Father   . Stroke Father   . Dementia Mother   . Breast cancer Mother    History  Substance Use Topics  . Smoking status: Former Smoker    Types:  Cigarettes  . Smokeless tobacco: Not on file  . Alcohol Use: No   OB History   Grav Para Term Preterm Abortions TAB SAB Ect Mult Living                 Review of Systems Ten point review of symptoms performed and is negative with the exception of symptoms noted above.     Allergies  Codeine; Horse-derived products; and Tetanus toxoid  Home Medications   Current Outpatient Rx  Name  Route  Sig  Dispense  Refill  . acetaminophen (TYLENOL) 500 MG tablet   Oral   Take 1,000 mg by mouth 2 (two) times daily.          Marland Kitchen. aspirin 81 MG tablet   Oral   Take 81 mg by mouth daily.           Marland Kitchen. atorvastatin (LIPITOR) 20 MG tablet   Oral   Take 10 mg by mouth every morning.         . Calcium Carbonate-Vitamin D 600-400 MG-UNIT per tablet   Oral   Take 2 tablets by mouth daily.         . chlorthalidone (HYGROTON) 25 MG tablet   Oral   Take 12.5 mg by mouth every morning.         .Marland Kitchen  cholecalciferol (VITAMIN D) 1000 UNITS tablet   Oral   Take 1,000 Units by mouth daily.         Marland Kitchen docusate sodium (COLACE) 100 MG capsule   Oral   Take 100-200 mg by mouth at bedtime as needed for mild constipation.         Marland Kitchen donepezil (ARICEPT) 10 MG tablet   Oral   Take 10 mg by mouth at bedtime.         . nitroGLYCERIN (NITROSTAT) 0.4 MG SL tablet   Sublingual   Place 0.4 mg under the tongue every 5 (five) minutes as needed for chest pain.         . polyethylene glycol (MIRALAX / GLYCOLAX) packet   Oral   Take 17 g by mouth daily as needed for mild constipation.         . potassium chloride (K-DUR) 10 MEQ tablet   Oral   Take 10 mEq by mouth at bedtime.         . traMADol (ULTRAM) 50 MG tablet   Oral   Take 50 mg by mouth every 6 (six) hours as needed for moderate pain.         . traZODone (DESYREL) 50 MG tablet   Oral   Take 25 mg by mouth at bedtime as needed for sleep.           BP 142/66  Pulse 73  Temp(Src) 99 F (37.2 C) (Oral)  Resp 18  Ht 5'  3.5" (1.613 m)  Wt 136 lb (61.689 kg)  BMI 23.71 kg/m2  SpO2 97% Physical Exam Gen: well developed and well nourished appearing Head: NCAT Eyes: PERL, EOMI Nose: no epistaixis or rhinorrhea Mouth/throat: mucosa is moist and pink Neck: supple, no stridor Lungs: CTA B, no wheezing, rhonchi or rales CV: RRR, no murmur, extremities appear well perfused.  Chest wall: ttp over the left anterior chest wall - diffusely.  Abd: soft, notender, nondistended Back: no ttp, no cva ttp Skin: warm and dry Ext: normal to inspection, no dependent edema Neuro: CN ii-xii grossly intact, no focal deficits Psyche; normal affect,  calm and cooperative.   ED Course  Procedures (including critical care time) Labs Review  Results for orders placed during the hospital encounter of 10/16/13 (from the past 24 hour(s))  BASIC METABOLIC PANEL     Status: Abnormal   Collection Time    10/17/13 12:15 AM      Result Value Ref Range   Sodium 139  137 - 147 mEq/L   Potassium 3.4 (*) 3.7 - 5.3 mEq/L   Chloride 95 (*) 96 - 112 mEq/L   CO2 29  19 - 32 mEq/L   Glucose, Bld 103 (*) 70 - 99 mg/dL   BUN 14  6 - 23 mg/dL   Creatinine, Ser 0.97  0.50 - 1.10 mg/dL   Calcium 9.2  8.4 - 10.5 mg/dL   GFR calc non Af Amer 53 (*) >90 mL/min   GFR calc Af Amer 61 (*) >90 mL/min  CBC WITH DIFFERENTIAL     Status: Abnormal   Collection Time    10/17/13 12:15 AM      Result Value Ref Range   WBC 10.7 (*) 4.0 - 10.5 K/uL   RBC 4.28  3.87 - 5.11 MIL/uL   Hemoglobin 12.9  12.0 - 15.0 g/dL   HCT 38.8  36.0 - 46.0 %   MCV 90.7  78.0 - 100.0 fL   MCH 30.1  26.0 - 34.0 pg   MCHC 33.2  30.0 - 36.0 g/dL   RDW 14.2  11.5 - 15.5 %   Platelets 164  150 - 400 K/uL   Neutrophils Relative % 82 (*) 43 - 77 %   Neutro Abs 8.8 (*) 1.7 - 7.7 K/uL   Lymphocytes Relative 9 (*) 12 - 46 %   Lymphs Abs 1.0  0.7 - 4.0 K/uL   Monocytes Relative 7  3 - 12 %   Monocytes Absolute 0.7  0.1 - 1.0 K/uL   Eosinophils Relative 2  0 - 5 %    Eosinophils Absolute 0.2  0.0 - 0.7 K/uL   Basophils Relative 0  0 - 1 %   Basophils Absolute 0.0  0.0 - 0.1 K/uL  I-STAT TROPOININ, ED     Status: None   Collection Time    10/17/13 12:26 AM      Result Value Ref Range   Troponin i, poc 0.00  0.00 - 0.08 ng/mL   Comment 3                Imaging Review Dg Chest Portable 1 View  10/16/2013   CLINICAL DATA:  Left-sided chest pain  EXAM: PORTABLE CHEST - 1 VIEW  COMPARISON:  Prior radiograph from 11/06/2007  FINDINGS: Median sternotomy wires with underlying CABG markers noted. Cardiac and mediastinal silhouettes are stable in size and contour, and remain within normal limits.  Lungs are hypoinflated with elevation of the left hemidiaphragm. Multiple prominent loops of gas-filled bowel are seen subjacent to the elevated left hemidiaphragm. No definite free intraperitoneal air seen on this semi erect film. Cholecystectomy clips noted.  Lungs are clear without focal infiltrate, pulmonary edema, or pleural effusion. Mild left basilar atelectasis present. No pneumothorax.  IMPRESSION: 1. Elevated left hemidiaphragm with multiple prominent gas-filled loops of bowel within the left upper quadrant. Query intra-abdominal process as source of left-sided chest pain. 2. No acute cardiopulmonary abnormality.   Electronically Signed   By: Jeannine Boga M.D.   On: 10/16/2013 23:28     EKG Interpretation   Date/Time:  Thursday October 16 2013 22:30:45 EDT Ventricular Rate:  72 PR Interval:  154 QRS Duration: 85 QT Interval:  425 QTC Calculation: 465 R Axis:   45 Text Interpretation:  Sinus rhythm Abnormal R-wave progression, early  transition Minimal ST depression, anterior leads Confirmed by Christy Gentles   MD, DONALD ([id number]) on 10/16/2013 10:41:30 PM      MDM   Patient with chest wall pain - easily reproducible and onset the day after doing yard work. ED work up is otherwise unremarkable. The patient is stable with plan for close outpatient f/u  and symptomatic management with Motrin and Tramadol (already prescribed)                          Elyn Peers, MD 10/17/13 [phone]

## 2013-10-17 ENCOUNTER — Telehealth: Payer: Self-pay

## 2013-10-17 ENCOUNTER — Emergency Department (HOSPITAL_COMMUNITY): Payer: Medicare HMO

## 2013-10-17 LAB — BASIC METABOLIC PANEL
BUN: 14 mg/dL (ref 6–23)
CALCIUM: 9.2 mg/dL (ref 8.4–10.5)
CO2: 29 meq/L (ref 19–32)
CREATININE: 0.97 mg/dL (ref 0.50–1.10)
Chloride: 95 mEq/L — ABNORMAL LOW (ref 96–112)
GFR calc Af Amer: 61 mL/min — ABNORMAL LOW (ref >90)
GFR calc non Af Amer: 53 mL/min — ABNORMAL LOW (ref >90)
GLUCOSE: 103 mg/dL — AB (ref 70–99)
Potassium: 3.4 mEq/L — ABNORMAL LOW (ref 3.7–5.3)
Sodium: 139 mEq/L (ref 137–147)

## 2013-10-17 LAB — CBC WITH DIFFERENTIAL/PLATELET
BASOS ABS: 0 10*3/uL (ref 0.0–0.1)
Basophils Relative: 0 % (ref 0–1)
EOS PCT: 2 % (ref 0–5)
Eosinophils Absolute: 0.2 10*3/uL (ref 0.0–0.7)
HCT: 38.8 % (ref 36.0–46.0)
Hemoglobin: 12.9 g/dL (ref 12.0–15.0)
Lymphocytes Relative: 9 % — ABNORMAL LOW (ref 12–46)
Lymphs Abs: 1 10*3/uL (ref 0.7–4.0)
MCH: 30.1 pg (ref 26.0–34.0)
MCHC: 33.2 g/dL (ref 30.0–36.0)
MCV: 90.7 fL (ref 78.0–100.0)
Monocytes Absolute: 0.7 10*3/uL (ref 0.1–1.0)
Monocytes Relative: 7 % (ref 3–12)
NEUTROS PCT: 82 % — AB (ref 43–77)
Neutro Abs: 8.8 10*3/uL — ABNORMAL HIGH (ref 1.7–7.7)
Platelets: 164 10*3/uL (ref 150–400)
RBC: 4.28 MIL/uL (ref 3.87–5.11)
RDW: 14.2 % (ref 11.5–15.5)
WBC: 10.7 10*3/uL — AB (ref 4.0–10.5)

## 2013-10-17 LAB — I-STAT TROPONIN, ED: TROPONIN I, POC: 0 ng/mL (ref 0.00–0.08)

## 2013-10-17 NOTE — Discharge Instructions (Signed)

## 2013-10-17 NOTE — Telephone Encounter (Signed)
Daughter, Shawneequa Baldridge, called to make follow-up appointment.  Daughter provided information below.  Yesterday patient was not eating well and was sleeping all day.  Last night patient mentioned pain underneath her breast.  Stated that it hurt worse when she took a deep breath or applied pressure to the area. Described pain as sharp/shooting pain.  Patient also c/o shoulder pain.   Ambulance was called and the patient was taken to the ED.  Chest x-ray-negative.  EKG- SR Hr 72 with abnormal R wave progression, minimal ST depression.  ED felt that pain was due more to muscular/chest wall pain per daugther.  Patient was sent home and encouraged to follow-up with her PCP.    Patient continues to experience some pain underneath her breast and shoulder.   Pain currently not severe.  Has tramadol available for pain.  Patient encouraged to rest.  According to daughter, pain could have been brought on by patient raking leaves on Monday.  Daughter is aware of what warning signals to look for that will alert her to seek help for patient immediately.    Medication and allergies:  Reviewed and updated Local pharmacy:  [id number] - [place], [place] [address] No changes to personal, family history or past surgical hx   Appointment scheduled for 10/20/13 @ 11:30 am with Dr. Birdie Riddle.

## 2013-10-20 ENCOUNTER — Other Ambulatory Visit: Payer: Self-pay | Admitting: General Practice

## 2013-10-20 ENCOUNTER — Ambulatory Visit (INDEPENDENT_AMBULATORY_CARE_PROVIDER_SITE_OTHER): Payer: Commercial Managed Care - HMO | Admitting: Family Medicine

## 2013-10-20 ENCOUNTER — Encounter: Payer: Self-pay | Admitting: Family Medicine

## 2013-10-20 VITALS — BP 130/86 | HR 60 | Temp 98.2°F | Resp 16 | Wt 137.2 lb

## 2013-10-20 DIAGNOSIS — M19019 Primary osteoarthritis, unspecified shoulder: Secondary | ICD-10-CM | POA: Insufficient documentation

## 2013-10-20 DIAGNOSIS — R5383 Other fatigue: Secondary | ICD-10-CM

## 2013-10-20 DIAGNOSIS — R5381 Other malaise: Secondary | ICD-10-CM | POA: Insufficient documentation

## 2013-10-20 DIAGNOSIS — R079 Chest pain, unspecified: Secondary | ICD-10-CM

## 2013-10-20 LAB — CBC WITH DIFFERENTIAL/PLATELET
Basophils Absolute: 0 10*3/uL (ref 0.0–0.1)
Basophils Relative: 0.9 % (ref 0.0–3.0)
EOS PCT: 4.9 % (ref 0.0–5.0)
Eosinophils Absolute: 0.2 10*3/uL (ref 0.0–0.7)
HCT: 36.5 % (ref 36.0–46.0)
Hemoglobin: 12 g/dL (ref 12.0–15.0)
LYMPHS PCT: 15.8 % (ref 12.0–46.0)
Lymphs Abs: 0.8 10*3/uL (ref 0.7–4.0)
MCHC: 33 g/dL (ref 30.0–36.0)
MCV: 89.9 fl (ref 78.0–100.0)
MONO ABS: 0.4 10*3/uL (ref 0.1–1.0)
Monocytes Relative: 8.9 % (ref 3.0–12.0)
NEUTROS PCT: 69.5 % (ref 43.0–77.0)
Neutro Abs: 3.5 10*3/uL (ref 1.4–7.7)
PLATELETS: 213 10*3/uL (ref 150.0–400.0)
RBC: 4.05 Mil/uL (ref 3.87–5.11)
RDW: 14.6 % (ref 11.5–14.6)
WBC: 5 10*3/uL (ref 4.5–10.5)

## 2013-10-20 LAB — TSH: TSH: 1.11 u[IU]/mL (ref 0.35–5.50)

## 2013-10-20 LAB — BASIC METABOLIC PANEL
BUN: 17 mg/dL (ref 6–23)
CALCIUM: 9.4 mg/dL (ref 8.4–10.5)
CO2: 31 mEq/L (ref 19–32)
Chloride: 95 mEq/L — ABNORMAL LOW (ref 96–112)
Creatinine, Ser: 1.1 mg/dL (ref 0.4–1.2)
GFR: 52.67 mL/min — AB (ref >60.00)
Glucose, Bld: 82 mg/dL (ref 70–99)
Potassium: 3.2 mEq/L — ABNORMAL LOW (ref 3.5–5.1)
Sodium: 136 mEq/L (ref 135–145)

## 2013-10-20 MED ORDER — POTASSIUM CHLORIDE CRYS ER 20 MEQ PO TBCR: 20.0000 meq | EXTENDED_RELEASE_TABLET | Freq: Every day | ORAL | Status: DC

## 2013-10-20 NOTE — Patient Instructions (Signed)
Follow up as needed REST as your body tells you and ease back into your activity Make sure you are eating regularly- your body needs fuel to go! I entered the referral for ortho Call with any questions or concerns Hang in there!!

## 2013-10-20 NOTE — Progress Notes (Signed)
   Subjective:    Patient ID: Wanda Wright, female    DOB: 11/10/1928, 80 y.o.   MRN: [id number]  HPI ER f/u- pt went to ER on 3/12 w/ CP after doing yard work.  ER w/u was negative for MI.  CXR showed a lot of gas on L side and mildly elevated WBC.  No longer having CP.  Pt reports ongoing fatigue.  Has not been active since hospital visit on Thursday.  Daughter reports pt isn't eating well.   Review of Systems For ROS see HPI     Objective:   Physical Exam  Vitals reviewed. Constitutional: She is oriented to person, place, and time. She appears well-developed and well-nourished. No distress.  HENT:  Head: Normocephalic and atraumatic.  Neck: Normal range of motion. Neck supple. No thyromegaly present.  Cardiovascular: Normal rate, regular rhythm, normal heart sounds and intact distal pulses.   Pulmonary/Chest: Effort normal and breath sounds normal. No respiratory distress. She has no wheezes. She has no rales.  Lymphadenopathy:    She has no cervical adenopathy.  Neurological: She is alert and oriented to person, place, and time.  Skin: Skin is warm and dry.  Psychiatric: She has a normal mood and affect. Her behavior is normal.          Assessment & Plan:

## 2013-10-20 NOTE — Progress Notes (Signed)
Pre visit review using our clinic review tool, if applicable. No additional management support is needed unless otherwise documented below in the visit note. 

## 2013-10-21 NOTE — Assessment & Plan Note (Signed)
New to provider, recurrent for pt.  Determined to be chest wall pain in ER after day doing yard work.  CP has improved.  Reviewed supportive care and red flags that should prompt return.  Pt expressed understanding and is in agreement w/ plan.

## 2013-10-21 NOTE — Assessment & Plan Note (Signed)
New.  Suspect pt just overdid it w/ her activities but will check labs to r/o infxn, anemia, thyroid abnormality.  Reviewed need to ease back into activity and listen to her body.  Will follow.

## 2013-10-21 NOTE — Assessment & Plan Note (Signed)
Referral made to ortho as required by insurance

## 2013-10-31 ENCOUNTER — Telehealth: Payer: Self-pay | Admitting: General Practice

## 2013-10-31 ENCOUNTER — Telehealth: Payer: Self-pay | Admitting: Family Medicine

## 2013-10-31 MED ORDER — POTASSIUM CHLORIDE CRYS ER 20 MEQ PO TBCR: 20.0000 meq | EXTENDED_RELEASE_TABLET | Freq: Every day | ORAL | Status: DC

## 2013-10-31 NOTE — Telephone Encounter (Signed)
Pt daughter called stating that her mom needs a referral to Dr. Redmond Baseman on [address] due to some ear pain/issues has been having. Please advise if ok for referral

## 2013-10-31 NOTE — Telephone Encounter (Signed)
Med filled.  

## 2013-10-31 NOTE — Telephone Encounter (Signed)
Ok to refer to ENT (need dx from pt or daughter)

## 2013-10-31 NOTE — Telephone Encounter (Signed)
Patient daughter called and stated that they went to pick up her mother's potassium chloride SA (K-DUR,KLOR-CON) 20 MEQ tablet and her pharmacy stated that they did not have it. Please resend it. Thanks

## 2013-10-31 NOTE — Telephone Encounter (Signed)
Spoke with patient's daughter, Milayah Krell. Daughter stated that her mother has made an appointment with Dr. Birdie Riddle on  11/05/2013 @ 2:30pm for her current ear issues.  Mother wants to wait until after she has seen Dr. Birdie Riddle before making the referral.

## 2013-11-05 ENCOUNTER — Ambulatory Visit (INDEPENDENT_AMBULATORY_CARE_PROVIDER_SITE_OTHER): Payer: Medicare HMO | Admitting: Family Medicine

## 2013-11-05 ENCOUNTER — Encounter: Payer: Self-pay | Admitting: Family Medicine

## 2013-11-05 VITALS — BP 128/84 | HR 72 | Temp 98.2°F | Resp 16 | Wt 138.2 lb

## 2013-11-05 DIAGNOSIS — J309 Allergic rhinitis, unspecified: Secondary | ICD-10-CM

## 2013-11-05 NOTE — Patient Instructions (Signed)
Follow up as needed Restart the Flonase daily Add Claritin or Zyrtec once daily Call with any questions or concerns Happy Easter!!

## 2013-11-05 NOTE — Progress Notes (Signed)
Pre visit review using our clinic review tool, if applicable. No additional management support is needed unless otherwise documented below in the visit note. 

## 2013-11-05 NOTE — Assessment & Plan Note (Signed)
Chronic problem.  Pt is not taking meds as directed but reports when she does, she feels better.  Strongly encouraged daily antihistamine use and nasal steroid.  Daughter expressed understanding

## 2013-11-05 NOTE — Progress Notes (Signed)
   Subjective:    Patient ID: Wanda Wright, female    DOB: 1928-09-03, 80 y.o.   MRN: [id number]  HPI L ear pain- this was the original reason for the appt.  Having L eye fatigue laterally.  Not tender to touch.  No pain w/ chewing.  Ear pain has mostly resolved.     Review of Systems For ROS see HPI     Objective:   Physical Exam  Vitals reviewed. Constitutional: She appears well-developed and well-nourished. No distress.  HENT:  Head: Normocephalic and atraumatic.  Right Ear: Tympanic membrane normal.  Left Ear: Tympanic membrane normal.  Nose: Mucosal edema and rhinorrhea present. Right sinus exhibits no maxillary sinus tenderness and no frontal sinus tenderness. Left sinus exhibits no maxillary sinus tenderness and no frontal sinus tenderness.  Mouth/Throat: Mucous membranes are normal. Posterior oropharyngeal erythema (w/ PND) present.  No TTP over temporal arteries bilaterally  Eyes: Conjunctivae and EOM are normal. Pupils are equal, round, and reactive to light.  Neck: Normal range of motion. Neck supple.  Cardiovascular: Normal rate, regular rhythm and normal heart sounds.   Pulmonary/Chest: Effort normal and breath sounds normal. No respiratory distress. She has no wheezes. She has no rales.  Lymphadenopathy:    She has no cervical adenopathy.          Assessment & Plan:

## 2013-11-10 ENCOUNTER — Other Ambulatory Visit: Payer: Self-pay | Admitting: Family Medicine

## 2013-11-10 ENCOUNTER — Other Ambulatory Visit: Payer: Self-pay | Admitting: General Practice

## 2013-11-10 MED ORDER — POTASSIUM CHLORIDE ER 10 MEQ PO TBCR: 10.0000 meq | EXTENDED_RELEASE_TABLET | Freq: Two times a day (BID) | ORAL | Status: DC

## 2013-11-10 NOTE — Telephone Encounter (Signed)
Med filled.  

## 2014-01-06 ENCOUNTER — Telehealth: Payer: Self-pay | Admitting: General Practice

## 2014-01-06 DIAGNOSIS — R1310 Dysphagia, unspecified: Secondary | ICD-10-CM

## 2014-01-06 DIAGNOSIS — D126 Benign neoplasm of colon, unspecified: Secondary | ICD-10-CM

## 2014-01-06 NOTE — Telephone Encounter (Signed)
[employer] auth completed and faxed to Dr. Ulyses Amor office. Left message for patient to call office.

## 2014-01-06 NOTE — Telephone Encounter (Signed)
Per Eustaquio Maize, patient advised referral completed.

## 2014-01-06 NOTE — Telephone Encounter (Signed)
Pt family called and requested a referral to Dr. Saralyn Pilar hung with GI (already sees this provider pt has [facility]) . Referral placed due to colonic polyps, dysphagia, and constipation.

## 2014-01-25 ENCOUNTER — Other Ambulatory Visit: Payer: Self-pay | Admitting: Family Medicine

## 2014-01-26 NOTE — Telephone Encounter (Signed)
Last OV 10-20-13 Med filled 10-15-13 #60 with 3  No CSC or UDS

## 2014-01-26 NOTE — Telephone Encounter (Signed)
Med faxed, will complete csc at physical in September.

## 2014-01-26 NOTE — Telephone Encounter (Signed)
Needs CSC and UDS- script approved

## 2014-02-01 ENCOUNTER — Other Ambulatory Visit: Payer: Self-pay | Admitting: Family Medicine

## 2014-02-02 NOTE — Telephone Encounter (Signed)
Med filled.  

## 2014-02-26 ENCOUNTER — Telehealth: Payer: Self-pay | Admitting: Family Medicine

## 2014-02-26 DIAGNOSIS — M19019 Primary osteoarthritis, unspecified shoulder: Secondary | ICD-10-CM

## 2014-02-26 NOTE — Telephone Encounter (Signed)
Caller name: Deb Relation to DV:[id number] Call back number:[phone]   Reason for call:  Pt is needing a referral to Dr. Mardelle Matte at [place].  Has apt on Tuesday, 7/28  Pt's daughter is also inquiring about orders for injections for her shoulder and having them be standing orders.

## 2014-02-26 NOTE — Telephone Encounter (Signed)
Referral placed, pt family notified. Also per tabori cannot place standing orders for shoulder injections that needs to come from Ortho.

## 2014-03-10 ENCOUNTER — Other Ambulatory Visit: Payer: Self-pay | Admitting: General Practice

## 2014-03-10 MED ORDER — GABAPENTIN 100 MG PO CAPS: 100.0000 mg | ORAL_CAPSULE | Freq: Three times a day (TID) | ORAL | Status: DC | PRN

## 2014-03-10 NOTE — Telephone Encounter (Signed)
Pt daughter called and lmovm to inform me that pt was beginning her gabapentin again. Med filled to local pharmacy.

## 2014-03-22 ENCOUNTER — Other Ambulatory Visit: Payer: Self-pay | Admitting: Family Medicine

## 2014-03-23 ENCOUNTER — Telehealth: Payer: Self-pay | Admitting: General Practice

## 2014-03-23 MED ORDER — TRAMADOL HCL 50 MG PO TABS: ORAL_TABLET | ORAL | Status: DC

## 2014-03-23 MED ORDER — CHLORTHALIDONE 25 MG PO TABS: 12.5000 mg | ORAL_TABLET | Freq: Every morning | ORAL | Status: DC

## 2014-03-23 NOTE — Telephone Encounter (Signed)
Med filled.  

## 2014-03-23 NOTE — Telephone Encounter (Signed)
Okay, tramadol #30, hygroton #30 noting  her last potassium was low, further RF per PCP

## 2014-03-23 NOTE — Telephone Encounter (Signed)
Last OV 11-05-13 Tramadol last filled 10-15-13 360 with 3 Hygroton last filled 02-03-13 #30 with 5  No UDS

## 2014-03-29 ENCOUNTER — Other Ambulatory Visit: Payer: Self-pay | Admitting: Family Medicine

## 2014-03-30 NOTE — Telephone Encounter (Signed)
Med filled.  

## 2014-04-05 ENCOUNTER — Other Ambulatory Visit: Payer: Self-pay | Admitting: Internal Medicine

## 2014-04-06 ENCOUNTER — Other Ambulatory Visit: Payer: Self-pay | Admitting: Family Medicine

## 2014-04-06 NOTE — Telephone Encounter (Signed)
Med filled.  

## 2014-04-10 ENCOUNTER — Ambulatory Visit (INDEPENDENT_AMBULATORY_CARE_PROVIDER_SITE_OTHER): Payer: Commercial Managed Care - HMO | Admitting: Family Medicine

## 2014-04-10 ENCOUNTER — Encounter: Payer: Self-pay | Admitting: Family Medicine

## 2014-04-10 VITALS — BP 140/86 | HR 52 | Temp 97.9°F | Resp 17 | Ht 62.5 in | Wt 125.2 lb

## 2014-04-10 DIAGNOSIS — M810 Age-related osteoporosis without current pathological fracture: Secondary | ICD-10-CM

## 2014-04-10 DIAGNOSIS — F329 Major depressive disorder, single episode, unspecified: Secondary | ICD-10-CM

## 2014-04-10 DIAGNOSIS — Z23 Encounter for immunization: Secondary | ICD-10-CM

## 2014-04-10 DIAGNOSIS — I25810 Atherosclerosis of coronary artery bypass graft(s) without angina pectoris: Secondary | ICD-10-CM

## 2014-04-10 DIAGNOSIS — I10 Essential (primary) hypertension: Secondary | ICD-10-CM

## 2014-04-10 DIAGNOSIS — E785 Hyperlipidemia, unspecified: Secondary | ICD-10-CM

## 2014-04-10 DIAGNOSIS — F3289 Other specified depressive episodes: Secondary | ICD-10-CM

## 2014-04-10 DIAGNOSIS — Z0000 Encounter for general adult medical examination without abnormal findings: Secondary | ICD-10-CM

## 2014-04-10 LAB — HEPATIC FUNCTION PANEL
ALBUMIN: 3.8 g/dL (ref 3.5–5.2)
ALT: 13 U/L (ref 0–35)
AST: 19 U/L (ref 0–37)
Alkaline Phosphatase: 68 U/L (ref 39–117)
Bilirubin, Direct: 0 mg/dL (ref 0.0–0.3)
TOTAL PROTEIN: 6.9 g/dL (ref 6.0–8.3)
Total Bilirubin: 0.6 mg/dL (ref 0.2–1.2)

## 2014-04-10 LAB — CBC WITH DIFFERENTIAL/PLATELET
BASOS ABS: 0 10*3/uL (ref 0.0–0.1)
Basophils Relative: 0.7 % (ref 0.0–3.0)
EOS PCT: 3.6 % (ref 0.0–5.0)
Eosinophils Absolute: 0.1 10*3/uL (ref 0.0–0.7)
HEMATOCRIT: 39.4 % (ref 36.0–46.0)
HEMOGLOBIN: 13 g/dL (ref 12.0–15.0)
LYMPHS ABS: 1.1 10*3/uL (ref 0.7–4.0)
Lymphocytes Relative: 27.1 % (ref 12.0–46.0)
MCHC: 32.9 g/dL (ref 30.0–36.0)
MCV: 91.8 fl (ref 78.0–100.0)
MONOS PCT: 6.4 % (ref 3.0–12.0)
Monocytes Absolute: 0.3 10*3/uL (ref 0.1–1.0)
NEUTROS ABS: 2.5 10*3/uL (ref 1.4–7.7)
Neutrophils Relative %: 62.2 % (ref 43.0–77.0)
Platelets: 180 10*3/uL (ref 150.0–400.0)
RBC: 4.29 Mil/uL (ref 3.87–5.11)
RDW: 13.3 % (ref 11.5–15.5)
WBC: 4 10*3/uL (ref 4.0–10.5)

## 2014-04-10 LAB — LIPID PANEL
Cholesterol: 158 mg/dL (ref 0–200)
HDL: 67.4 mg/dL (ref >39.00)
LDL Cholesterol: 72 mg/dL (ref 0–99)
NONHDL: 90.6
TRIGLYCERIDES: 95 mg/dL (ref 0.0–149.0)
Total CHOL/HDL Ratio: 2
VLDL: 19 mg/dL (ref 0.0–40.0)

## 2014-04-10 LAB — BASIC METABOLIC PANEL
BUN: 15 mg/dL (ref 6–23)
CHLORIDE: 104 meq/L (ref 96–112)
CO2: 29 mEq/L (ref 19–32)
Calcium: 9.8 mg/dL (ref 8.4–10.5)
Creatinine, Ser: 1 mg/dL (ref 0.4–1.2)
GFR: 57.59 mL/min — ABNORMAL LOW (ref >60.00)
Glucose, Bld: 88 mg/dL (ref 70–99)
Potassium: 3.7 mEq/L (ref 3.5–5.1)
Sodium: 139 mEq/L (ref 135–145)

## 2014-04-10 LAB — VITAMIN D 25 HYDROXY (VIT D DEFICIENCY, FRACTURES): VITD: 53.72 ng/mL (ref 30.00–100.00)

## 2014-04-10 LAB — TSH: TSH: 1.28 u[IU]/mL (ref 0.35–4.50)

## 2014-04-10 NOTE — Progress Notes (Signed)
Pre visit review using our clinic review tool, if applicable. No additional management support is needed unless otherwise documented below in the visit note. 

## 2014-04-10 NOTE — Progress Notes (Signed)
   Subjective:    Patient ID: Wanda Wright, female    DOB: 05-11-1929, 80 y.o.   MRN: [id number]  HPI Here today for CPE.  Risk Factors: HTN- chronic problem, on Chlorthalidone.  No CP, SOB, HAs, visual changes. Hyperlipidemia- chronic problem, on Atorvastatin.  No abd pain, N/V. Osteoporosis- chronic problem, on Vit D and Calcium.  Due for DEXA.    Physical Activity: exercising regularly- more active than daughter would like Depression: chronic problem, on Trazodone. Hearing: decreased to conversational tones and whispered ADL's: independent Cognitive: pt and daughter report some issues w/ short term memory (on Aricept), normal linear thought process, attention intact Home Safety: safe at home, lives w/ daughter Height, Weight, BMI, Visual Acuity: see vitals, vision corrected to 20/20 w/ glasses Counseling: UTD on colonoscopy, mammo.  No need for paps.  Due for DEXA Labs Ordered: See A&P Care Plan: See A&P    Review of Systems Patient reports no vision/ hearing changes, adenopathy,fever, weight change,  persistant/recurrent hoarseness , swallowing issues, chest pain, palpitations, edema, persistant/recurrent cough, hemoptysis, dyspnea (rest/exertional/paroxysmal nocturnal), gastrointestinal bleeding (melena, rectal bleeding), abdominal pain, significant heartburn, bowel changes, GU symptoms (dysuria, hematuria, incontinence), Gyn symptoms (abnormal  bleeding, pain),  syncope, focal weakness, memory loss, skin/hair/nail changes, abnormal bruising or bleeding.     Objective:   Physical Exam General Appearance:    Alert, cooperative, no distress, appears stated age  Head:    Normocephalic, without obvious abnormality, atraumatic  Eyes:    PERRL, conjunctiva/corneas clear, EOM's intact, fundi    benign, both eyes  Ears:    Normal TM's and external ear canals, both ears  Nose:   Nares normal, septum midline, mucosa normal, no drainage    or sinus tenderness  Throat:   Lips, mucosa, and  tongue normal; teeth and gums normal  Neck:   Supple, symmetrical, trachea midline, no adenopathy;    Thyroid: no enlargement/tenderness/nodules  Back:     Symmetric, no curvature, ROM normal, no CVA tenderness  Lungs:     Clear to auscultation bilaterally, respirations unlabored  Chest Wall:    No tenderness or deformity   Heart:    Regular rate and rhythm, S1 and S2 normal, no murmur, rub   or gallop  Breast Exam:    Deferred to mammo  Abdomen:     Soft, non-tender, bowel sounds active all four quadrants,    no masses, no organomegaly  Genitalia:    Deferred  Rectal:    Extremities:   Extremities normal, atraumatic, no cyanosis or edema  Pulses:   2+ and symmetric all extremities  Skin:   Skin color, texture, turgor normal, no rashes or lesions  Lymph nodes:   Cervical, supraclavicular, and axillary nodes normal  Neurologic:   CNII-XII intact, normal strength, sensation and reflexes    throughout          Assessment & Plan:

## 2014-04-10 NOTE — Patient Instructions (Signed)
Follow up in 6 months to recheck BP and cholesterol We'll notify you of your lab results and make any changes if needed Keep up the good work on healthy diet and regular exercise- in moderation! Schedule your mammo and bone density when you get your reminder in January (you're due in Feb) Call with any questions or concerns Happy Belated Birthday!!!

## 2014-04-12 ENCOUNTER — Other Ambulatory Visit: Payer: Self-pay | Admitting: Internal Medicine

## 2014-04-14 ENCOUNTER — Telehealth: Payer: Self-pay

## 2014-04-14 MED ORDER — TRAMADOL HCL 50 MG PO TABS: ORAL_TABLET | ORAL | Status: DC

## 2014-04-14 NOTE — Telephone Encounter (Signed)
Medication faxed to Pharmacy.  

## 2014-04-14 NOTE — Telephone Encounter (Signed)
Pt is requesting refill for Tramadol.  Last OV: 04/10/2014 Last Fill 03/23/2014 # 30 UDS: None  Please advise.

## 2014-04-14 NOTE — Telephone Encounter (Signed)
Patient of Dr. Birdie Riddle, will ok #21

## 2014-04-15 ENCOUNTER — Other Ambulatory Visit: Payer: Self-pay | Admitting: Family Medicine

## 2014-04-15 DIAGNOSIS — Z1231 Encounter for screening mammogram for malignant neoplasm of breast: Secondary | ICD-10-CM

## 2014-04-16 NOTE — Assessment & Plan Note (Signed)
Chronic problem, tolerating statin w/o difficulty.  Check labs.  Adjust meds prn  

## 2014-04-16 NOTE — Assessment & Plan Note (Signed)
Chronic problem, following w/ cards.  Goal is risk reduction w/ lipid and BP control.

## 2014-04-16 NOTE — Assessment & Plan Note (Signed)
Pt's PE unchanged.  UTD on health maintenance w/ exception of DEXA- order entered.  Check labs.  Anticipatory guidance provided.

## 2014-04-16 NOTE — Assessment & Plan Note (Signed)
Chronic problem.  Adequate control.  On Trazodone for sleep.

## 2014-04-16 NOTE — Assessment & Plan Note (Signed)
Chronic problem.  Adequate control.  Asymptomatic.  Check labs.  No anticipated med changes 

## 2014-04-16 NOTE — Assessment & Plan Note (Signed)
Chronic problem, on Ca and Vit D.  Due for DEXA- order entered.

## 2014-04-28 ENCOUNTER — Telehealth: Payer: Self-pay | Admitting: General Practice

## 2014-04-28 NOTE — Telephone Encounter (Signed)
Last OV 04-10-14 Tramadol last filled 04-14-14 #21 with 0

## 2014-04-29 MED ORDER — TRAMADOL HCL 50 MG PO TABS: ORAL_TABLET | ORAL | Status: DC

## 2014-04-29 NOTE — Telephone Encounter (Signed)
Ok for #60 

## 2014-04-29 NOTE — Telephone Encounter (Signed)
Med filled.  

## 2014-05-25 ENCOUNTER — Other Ambulatory Visit: Payer: Self-pay | Admitting: General Practice

## 2014-05-25 ENCOUNTER — Telehealth: Payer: Self-pay | Admitting: General Practice

## 2014-05-25 MED ORDER — GABAPENTIN 100 MG PO CAPS: 100.0000 mg | ORAL_CAPSULE | Freq: Three times a day (TID) | ORAL | Status: DC | PRN

## 2014-05-25 MED ORDER — FLUTICASONE PROPIONATE 50 MCG/ACT NA SUSP: 2.0000 | Freq: Every day | NASAL | Status: DC

## 2014-05-25 MED ORDER — TRAMADOL HCL 50 MG PO TABS: ORAL_TABLET | ORAL | Status: DC

## 2014-05-25 NOTE — Telephone Encounter (Signed)
Med filled and faxed.  

## 2014-05-25 NOTE — Telephone Encounter (Signed)
Ok for #60 

## 2014-05-25 NOTE — Telephone Encounter (Signed)
Last OV 04-10-14 Tramadol last filled 04-29-14 #60 with 0

## 2014-06-01 ENCOUNTER — Other Ambulatory Visit: Payer: Self-pay | Admitting: General Practice

## 2014-06-01 MED ORDER — ATORVASTATIN CALCIUM 20 MG PO TABS: ORAL_TABLET | ORAL | Status: DC

## 2014-06-09 ENCOUNTER — Other Ambulatory Visit: Payer: Self-pay | Admitting: General Practice

## 2014-06-09 MED ORDER — CHLORTHALIDONE 25 MG PO TABS: ORAL_TABLET | ORAL | Status: DC

## 2014-06-21 ENCOUNTER — Other Ambulatory Visit: Payer: Self-pay | Admitting: Family Medicine

## 2014-06-22 NOTE — Telephone Encounter (Signed)
Last OV 04-10-14 Tramadol last filled 05-25-14 #60 with 0

## 2014-06-22 NOTE — Telephone Encounter (Signed)
Med filled and faxed.  

## 2014-07-19 ENCOUNTER — Other Ambulatory Visit: Payer: Self-pay | Admitting: Family Medicine

## 2014-07-20 NOTE — Telephone Encounter (Signed)
Med filled and faxed.  

## 2014-07-20 NOTE — Telephone Encounter (Signed)
Last ov 04-10-14 Tramadol last filled 06-22-14 #60 with 0

## 2014-08-10 ENCOUNTER — Telehealth: Payer: Self-pay | Admitting: Family Medicine

## 2014-08-10 MED ORDER — TRAMADOL HCL 50 MG PO TABS: 50.0000 mg | ORAL_TABLET | Freq: Four times a day (QID) | ORAL | Status: DC | PRN

## 2014-08-10 NOTE — Telephone Encounter (Signed)
Ok for #60 

## 2014-08-10 NOTE — Telephone Encounter (Signed)
Caller name: debra Relation to pt: daughter Call back number:  [phone] Pharmacy:  Reason for call:   Patient will be going on vacation on the 11th and will be out of town for two weeks. She will run out of tramadol and would like to know if Dr. Birdie Riddle could refill a few days soon

## 2014-08-10 NOTE — Telephone Encounter (Signed)
Med filled and faxed.  

## 2014-08-10 NOTE — Telephone Encounter (Signed)
Last OV 04-07-14 Tramadol last filled 07-20-14 #60 with 0  Ok to fill early due to her going on vacation?

## 2014-08-18 ENCOUNTER — Other Ambulatory Visit: Payer: Self-pay | Admitting: General Practice

## 2014-08-18 MED ORDER — GABAPENTIN 100 MG PO CAPS: 100.0000 mg | ORAL_CAPSULE | Freq: Three times a day (TID) | ORAL | Status: DC | PRN

## 2014-08-31 ENCOUNTER — Other Ambulatory Visit: Payer: Self-pay | Admitting: General Practice

## 2014-08-31 MED ORDER — POTASSIUM CHLORIDE ER 10 MEQ PO TBCR: EXTENDED_RELEASE_TABLET | ORAL | Status: DC

## 2014-09-07 ENCOUNTER — Ambulatory Visit (INDEPENDENT_AMBULATORY_CARE_PROVIDER_SITE_OTHER): Payer: Commercial Managed Care - HMO | Admitting: Cardiology

## 2014-09-07 ENCOUNTER — Encounter: Payer: Self-pay | Admitting: Cardiology

## 2014-09-07 VITALS — BP 126/76 | HR 52 | Ht 62.5 in | Wt 130.0 lb

## 2014-09-07 DIAGNOSIS — R001 Bradycardia, unspecified: Secondary | ICD-10-CM | POA: Diagnosis not present

## 2014-09-07 DIAGNOSIS — M19019 Primary osteoarthritis, unspecified shoulder: Secondary | ICD-10-CM

## 2014-09-07 DIAGNOSIS — M129 Arthropathy, unspecified: Secondary | ICD-10-CM | POA: Diagnosis not present

## 2014-09-07 DIAGNOSIS — E785 Hyperlipidemia, unspecified: Secondary | ICD-10-CM

## 2014-09-07 DIAGNOSIS — I25810 Atherosclerosis of coronary artery bypass graft(s) without angina pectoris: Secondary | ICD-10-CM | POA: Diagnosis not present

## 2014-09-07 DIAGNOSIS — I779 Disorder of arteries and arterioles, unspecified: Secondary | ICD-10-CM

## 2014-09-07 DIAGNOSIS — I739 Peripheral vascular disease, unspecified: Secondary | ICD-10-CM

## 2014-09-07 NOTE — Assessment & Plan Note (Signed)
The patient requires steroid injections in her shoulder. She uses Tylenol. She and her daughter asked today if she can receive anti-inflammatory medications. From the cardiac viewpoint, we always want to limit nonsteroidal anti-inflammatory meds. If it is felt that it would clearly be to her advantage and help her feel better, a small dose of naproxen sodium would be reasonable.

## 2014-09-07 NOTE — Assessment & Plan Note (Signed)
Coronary disease is stable. She is not having any significant symptoms. I feel we do not need to do an exercise tolerance test. No further workup.

## 2014-09-07 NOTE — Progress Notes (Signed)
HPI Patient is seen today to follow-up coronary artery disease. She actually does very well. She underwent bypass surgery in 2004. Catheterization in 2009 revealed patent grafts. She's not having any chest pain. She still has discomfort in her shoulder for which she gets steroid injections. She and her daughter are asking if she can be allowed to have an anti-inflammatory medication.  Allergies  Allergen Reactions  . Codeine     REACTION: Elevated Heart rate  . Horse-Derived Products     unknown  . Tetanus Toxoid     unknown    Current Outpatient Prescriptions  Medication Sig Dispense Refill  . acetaminophen (TYLENOL) 500 MG tablet Take 1,000 mg by mouth 2 (two) times daily.     Marland Kitchen aspirin 81 MG tablet Take 81 mg by mouth daily.      Marland Kitchen atorvastatin (LIPITOR) 20 MG tablet TAKE 1 TABLET BY MOUTH AS DIRECTED 90 tablet 0  . Calcium Carbonate-Vitamin D 600-400 MG-UNIT per tablet Take 2 tablets by mouth daily.    . chlorthalidone (HYGROTON) 25 MG tablet TAKE AS DIRECTED 30 tablet 3  . cholecalciferol (VITAMIN D) 1000 UNITS tablet Take 1,000 Units by mouth daily.    Marland Kitchen donepezil (ARICEPT) 10 MG tablet TAKE 1 TABLET BY MOUTH AT BEDTIME 30 tablet 4  . fluticasone (FLONASE) 50 MCG/ACT nasal spray Place 2 sprays into both nostrils daily. 16 g 6  . gabapentin (NEURONTIN) 100 MG capsule Take 1 capsule (100 mg total) by mouth 3 (three) times daily as needed. 90 capsule 1  . nitroGLYCERIN (NITROSTAT) 0.4 MG SL tablet Place 0.4 mg under the tongue every 5 (five) minutes as needed for chest pain.    . polyethylene glycol (MIRALAX / GLYCOLAX) packet Take 17 g by mouth daily as needed for mild constipation.    . potassium chloride (KLOR-CON 10) 10 MEQ tablet TAKE 1 TABLET (10 MEQ TOTAL) BY MOUTH 2 (TWO) TIMES DAILY. 60 tablet 3  . traMADol (ULTRAM) 50 MG tablet Take 1 tablet (50 mg total) by mouth every 6 (six) hours as needed. 60 tablet 0   No current facility-administered medications for this visit.      History   Social History  . Marital Status: Married    Spouse Name: N/A    Number of Children: N/A  . Years of Education: N/A   Occupational History  . Not on file.   Social History Main Topics  . Smoking status: Former Smoker    Types: Cigarettes  . Smokeless tobacco: Not on file  . Alcohol Use: No  . Drug Use: No  . Sexual Activity: Not on file   Other Topics Concern  . Not on file   Social History Narrative   Widowed 2000.    Family History  Problem Relation Age of Onset  . Heart attack Father   . Stroke Father   . Dementia Mother   . Breast cancer Mother     Past Medical History  Diagnosis Date  . 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 Hyperlipidemia   . Hypertension   . Hemidiaphragm     elevated hemidiaphragm  . Colon polyps   . Osteoarthritis cervical spine   . Hearing loss   . Allergic rhinitis   . Carotid artery disease     Doppler, October, 2011, 0-39% bilateral  . Colon polyp   .  Hx of CABG     2004  . Ejection fraction     normal, catheterization, 2009  . Bradycardia     January, 2013    Past Surgical History  Procedure Laterality Date  . Total abdominal hysterectomy    . Hiatal hernia repair    . Coronary artery bypass graft      ACS  . Cholecystectomy    . Breast lumpectomy      multiple, all benign  . Colonoscopy w/ polypectomy      Patient Active Problem List   Diagnosis Date Noted  . Bradycardia     Priority: High  . CAD (coronary artery disease)     Priority: High  . Hyperlipidemia     Priority: High  . Hypertension     Priority: High  . Carotid artery disease     Priority: High  . Hx of CABG     Priority: High  . Ejection fraction     Priority: High  . Chest pain 10/20/2013  . Other malaise and fatigue 10/20/2013  . Shoulder arthritis 10/20/2013  . Sciatica of right side 10/01/2013  . Routine  general medical examination at a health care facility 01/03/2013  . Keratosis, seborrheic 03/28/2012  . Breast pain 02/14/2012  . Pectoralis muscle strain 02/14/2012  . Sinusitis, acute 12/20/2011  . Cherry angioma 12/05/2011  . Insomnia 12/05/2011  . Neuropathy of leg 09/05/2011  . Hemidiaphragm   . OM (otitis media) 07/19/2011  . Leg cramps 04/12/2011  . Sweating 04/12/2011  . Flushing 04/12/2011  . Constipation 04/06/2011  . Memory loss 02/10/2011  . OTHER&UNSPECIFIED DISEASES THE ORAL SOFT TISSUES 08/17/2010  . NEOPLASM, SKIN, UNCERTAIN BEHAVIOR 63/78/5885  . GAIT IMBALANCE 08/10/2010  . DYSPHAGIA 08/10/2010  . DEPRESSIVE DISORDER 06/13/2010  . COLONIC POLYPS 11/13/2008  . SEBACEOUS CYST, SCALP 10/05/2008  . OSTEOARTHRITIS, CERVICAL SPINE 10/05/2008  . DISEASE, HYPERTENSIVE HEART NOS, W/O HF 04/18/2007  . ALLERGIC RHINITIS 03/14/2007  . OSTEOPOROSIS 03/14/2007    ROS  Patient denies fever, chills, headache, sweats, rash, change in vision, change in hearing, chest pain, cough, nausea or vomiting, urinary symptoms. All other systems are reviewed and are negative.  PHYSICAL EXAM Patient is here with her daughter. She is oriented to person time and place. Affect is normal. Head is atraumatic. Sclera and conjunctiva are normal. There is no jugular venous distention. Lungs are clear. Respiratory effort is not labored. Cardiac exam reveals S1 and S2. Abdomen is soft. There is no peripheral edema. There are no musculoskeletal deformities. There are no skin rashes.  Filed Vitals:   09/07/14 0929  BP: 126/76  Pulse: 52  Height: 5' 2.5" (1.588 m)  Weight: 130 lb (58.968 kg)    EKG is done today and reviewed by me. She has sinus bradycardia. There is no significant change.  ASSESSMENT & PLAN

## 2014-09-07 NOTE — Assessment & Plan Note (Signed)
Patient is receiving appropriate therapy for her age. Also we know that she has a good result with her current dose of atorvastatin

## 2014-09-07 NOTE — Assessment & Plan Note (Signed)
Patient had minimal carotid disease in 2011. She does not have bruits. I've chosen not to repeat her carotid Doppler at this time.

## 2014-09-07 NOTE — Assessment & Plan Note (Signed)
Patient has asymptomatic sinus bradycardia. No further workup

## 2014-09-07 NOTE — Patient Instructions (Signed)
**Note De-identified  Obfuscation** Your physician recommends that you continue on your current medications as directed. Please refer to the Current Medication list given to you today.  Your physician wants you to follow-up in: 1 year. You will receive a reminder letter in the mail two months in advance. If you don't receive a letter, please call our office to schedule the follow-up appointment.  

## 2014-09-08 ENCOUNTER — Encounter: Payer: Self-pay | Admitting: Family Medicine

## 2014-09-08 ENCOUNTER — Ambulatory Visit (INDEPENDENT_AMBULATORY_CARE_PROVIDER_SITE_OTHER): Payer: Commercial Managed Care - HMO | Admitting: Family Medicine

## 2014-09-08 VITALS — BP 136/86 | HR 56 | Temp 97.9°F | Resp 16 | Wt 130.1 lb

## 2014-09-08 DIAGNOSIS — N644 Mastodynia: Secondary | ICD-10-CM | POA: Diagnosis not present

## 2014-09-08 DIAGNOSIS — H6121 Impacted cerumen, right ear: Secondary | ICD-10-CM

## 2014-09-08 DIAGNOSIS — H6120 Impacted cerumen, unspecified ear: Secondary | ICD-10-CM | POA: Insufficient documentation

## 2014-09-08 NOTE — Progress Notes (Signed)
   Subjective:    Patient ID: Wanda Wright, female    DOB: [birth year], 81 y.o.   MRN: [id number]  HPI Breast pain- L breast, 'off and on a sore place'.  First noticed 'at least a couple months'.  No palpable lump, 'it's just a sore spot'.  Has mammo scheduled for 2/12.  No skin changes, no rash present.  Currently mildly tender.   R ear cerumen impaction- pt reports ear itching and hx of difficulty w/ wax.   Review of Systems For ROS see HPI     Objective:   Physical Exam  Constitutional: She appears well-developed and well-nourished. No distress.  HENT:  Head: Normocephalic and atraumatic.  L TM WNL R TM obscured by cerumen, large amount removed successfully w/ curette.  Pulmonary/Chest: Left breast exhibits tenderness (mild TTP over L breast along previous incision scar). Left breast exhibits no inverted nipple, no mass, no nipple discharge and no skin change.    Vitals reviewed.         Assessment & Plan:

## 2014-09-08 NOTE — Progress Notes (Signed)
Pre visit review using our clinic review tool, if applicable. No additional management support is needed unless otherwise documented below in the visit note. 

## 2014-09-08 NOTE — Patient Instructions (Signed)
Follow up as scheduled We'll be on the lookout for your mammo I suspect your breast pain is due to scar tissue Call with any questions or concerns Hang in there!!

## 2014-09-08 NOTE — Assessment & Plan Note (Signed)
Recurrent problem for pt.  Has mammo scheduled for next week.  Pain is located along scar at site of previous excision.  No mass palpable.  Suspect this is just scar tissue pain.  Will await mammo results from next week.  Pt expressed understanding and is in agreement w/ plan.

## 2014-09-08 NOTE — Assessment & Plan Note (Signed)
Recurrent problem for pt.  Wax successfully removed in office today using curette.  Pt tolerated procedure w/o difficulty.

## 2014-09-14 ENCOUNTER — Other Ambulatory Visit: Payer: Self-pay | Admitting: General Practice

## 2014-09-14 MED ORDER — DONEPEZIL HCL 10 MG PO TABS: 10.0000 mg | ORAL_TABLET | Freq: Every day | ORAL | Status: DC

## 2014-09-18 ENCOUNTER — Ambulatory Visit
Admission: RE | Admit: 2014-09-18 | Discharge: 2014-09-18 | Disposition: A | Discharge status: Home / Self Care | Payer: Commercial Managed Care - HMO | Source: Ambulatory Visit | Attending: Family Medicine | Admitting: Family Medicine

## 2014-09-18 DIAGNOSIS — Z1231 Encounter for screening mammogram for malignant neoplasm of breast: Secondary | ICD-10-CM | POA: Diagnosis not present

## 2014-09-18 DIAGNOSIS — M810 Age-related osteoporosis without current pathological fracture: Secondary | ICD-10-CM

## 2014-09-18 DIAGNOSIS — M899 Disorder of bone, unspecified: Secondary | ICD-10-CM | POA: Diagnosis not present

## 2014-09-18 IMAGING — CR DG CHEST 1V PORT
1 series · 1 of 1 positions shown · non-contrast
Comparison: Prior radiograph from 11/06/2007

CLINICAL DATA: Left-sided chest pain

EXAM:
PORTABLE CHEST - 1 VIEW

[AP]
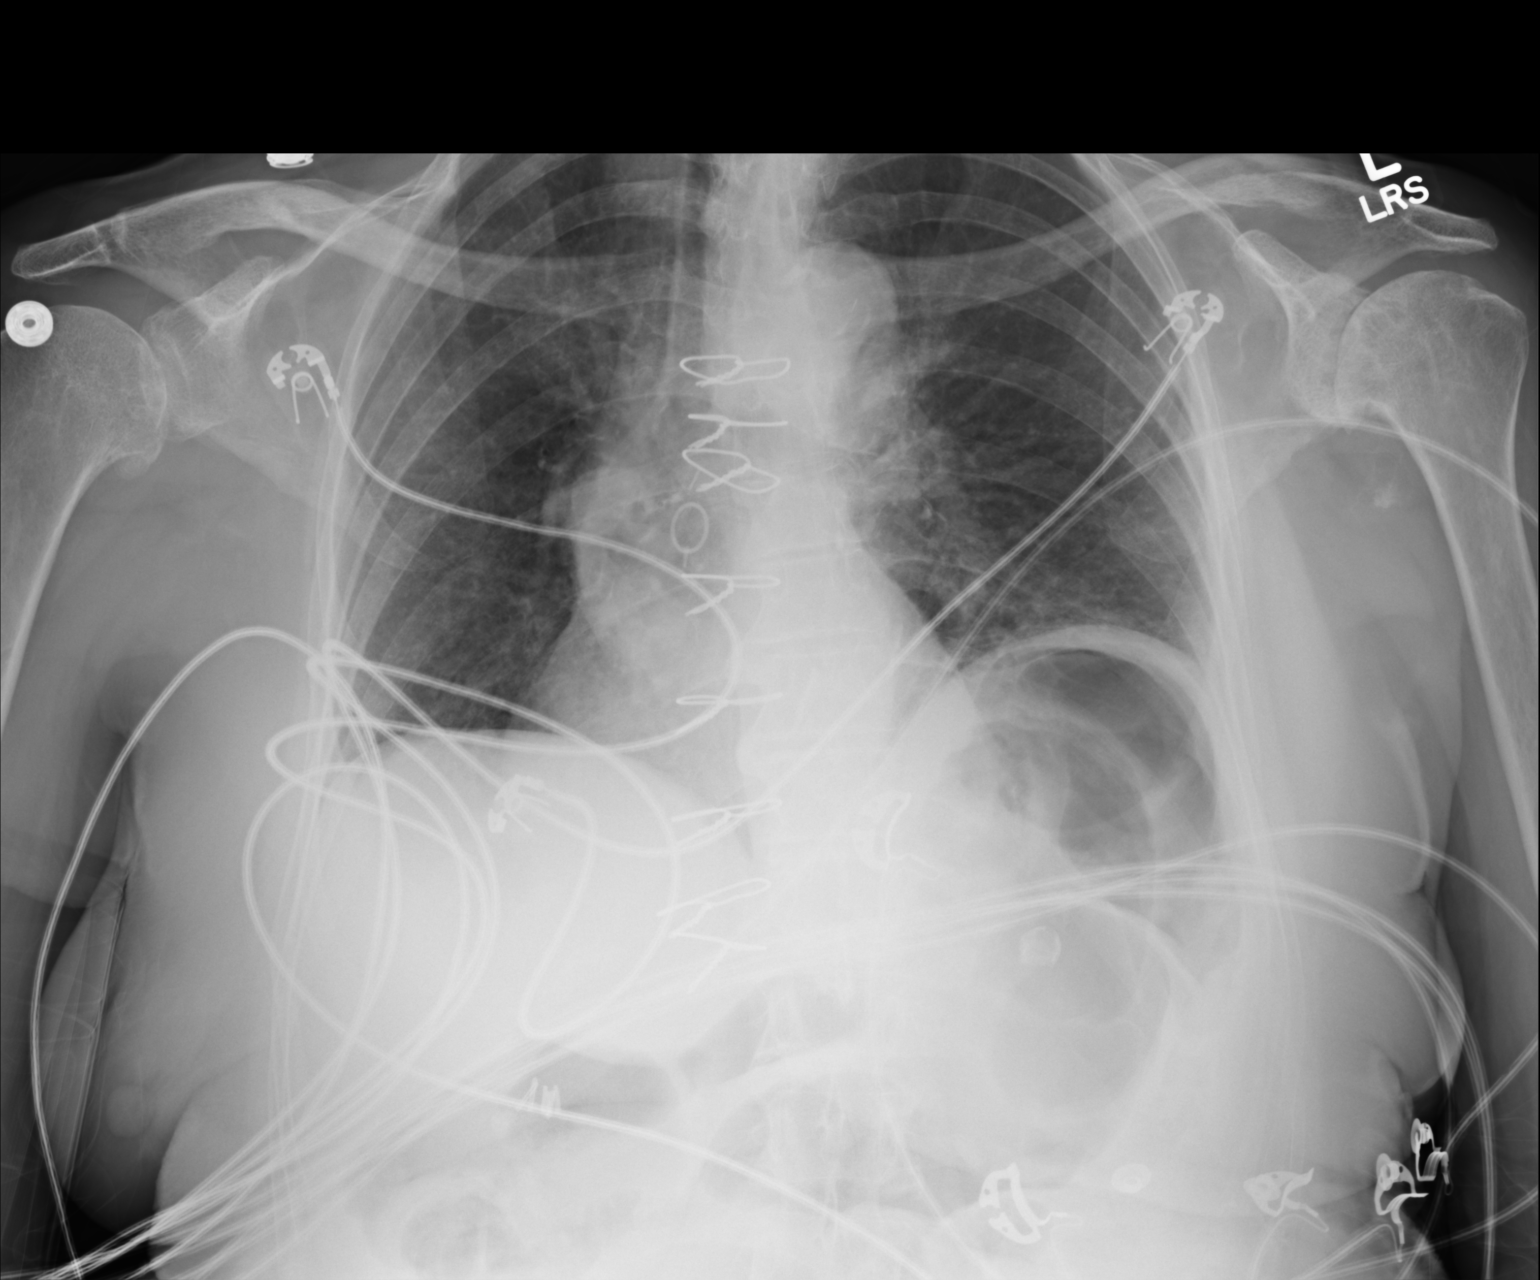

[1 of 1 positions shown; findings below may reference images not displayed]

FINDINGS: Median sternotomy wires with underlying CABG markers noted. Cardiac
and mediastinal silhouettes are stable in size and contour, and
remain within normal limits.

Lungs are hypoinflated with elevation of the left hemidiaphragm.
Multiple prominent loops of gas-filled bowel are seen subjacent to
the elevated left hemidiaphragm. No definite free intraperitoneal
air seen on this semi erect film. Cholecystectomy clips noted.

Lungs are clear without focal infiltrate, pulmonary edema, or
pleural effusion. Mild left basilar atelectasis present. No
pneumothorax.
IMPRESSION: 1. Elevated left hemidiaphragm with multiple prominent gas-filled
loops of bowel within the left upper quadrant. Query intra-abdominal
process as source of left-sided chest pain.
2. No acute cardiopulmonary abnormality.

## 2014-09-19 IMAGING — CR DG ABDOMEN 1V
1 series · 1 of 1 positions shown · non-contrast
Comparison: None.

CLINICAL DATA: Left flank pain

EXAM:
ABDOMEN - 1 VIEW

[t abdomen supine]
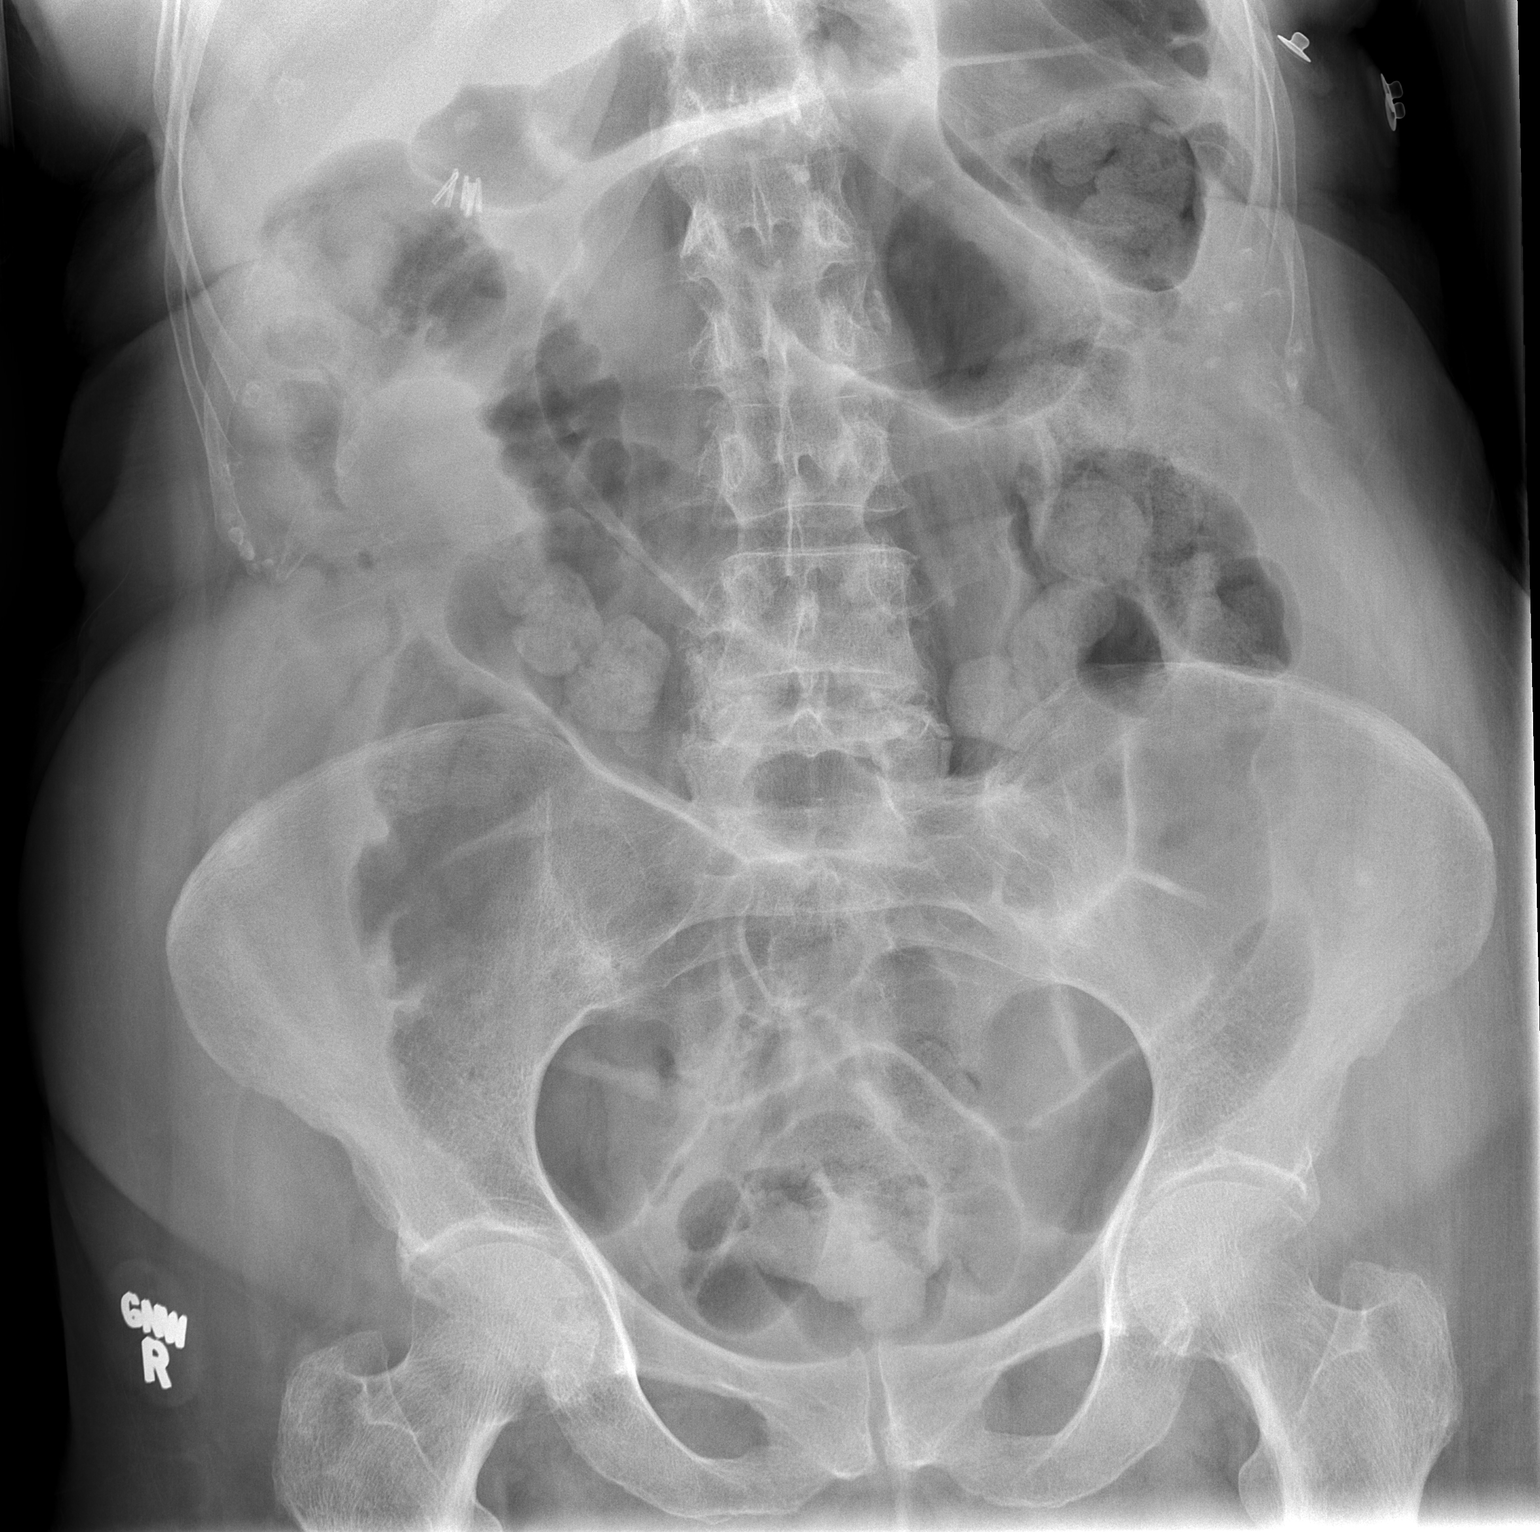

[1 of 1 positions shown; findings below may reference images not displayed]

FINDINGS: Moderate stool burden. Gaseous distention of loops of large and
small bowel. The upper abdomen/hemidiaphragms are excluded from the
field of view. Free intraperitoneal air cannot be excluded by supine
projection. Surgical clips right upper quadrant. 3 mm calcific
density projects inferior to the L4 left transverse process. No
acute osseous finding.
IMPRESSION: 3 mm calcific density projects inferior to the left L4 transverse
process. Ureteral stone not excluded given the stated history.

Nonspecific, nonobstructive bowel gas pattern. Moderate stool
burden.

## 2014-09-21 ENCOUNTER — Other Ambulatory Visit: Payer: Self-pay | Admitting: General Practice

## 2014-09-21 MED ORDER — TRAMADOL HCL 50 MG PO TABS: 50.0000 mg | ORAL_TABLET | Freq: Four times a day (QID) | ORAL | Status: DC | PRN

## 2014-09-21 NOTE — Telephone Encounter (Signed)
Last OV 09-08-14 Tramadol last filled 08-10-14 #60 with 0

## 2014-10-12 ENCOUNTER — Telehealth: Payer: Self-pay | Admitting: Family Medicine

## 2014-10-12 DIAGNOSIS — M47892 Other spondylosis, cervical region: Secondary | ICD-10-CM

## 2014-10-12 NOTE — Telephone Encounter (Signed)
Referral placed.

## 2014-10-12 NOTE — Telephone Encounter (Signed)
Caller name: deborah Relation to pt: daughter Call back number: [phone] Pharmacy:  Reason for call:   Has [employer] and is requesting a referral for patient. Patient has appointment scheduled for 10/19/14 with Dr. Beather Arbour at [facility] for a cortisone shot.

## 2014-10-14 ENCOUNTER — Encounter: Payer: Self-pay | Admitting: Family Medicine

## 2014-10-14 ENCOUNTER — Ambulatory Visit (INDEPENDENT_AMBULATORY_CARE_PROVIDER_SITE_OTHER): Payer: Commercial Managed Care - HMO | Admitting: Family Medicine

## 2014-10-14 VITALS — BP 140/86 | HR 55 | Temp 97.9°F | Resp 16 | Wt 132.2 lb

## 2014-10-14 DIAGNOSIS — G4700 Insomnia, unspecified: Secondary | ICD-10-CM

## 2014-10-14 DIAGNOSIS — I10 Essential (primary) hypertension: Secondary | ICD-10-CM | POA: Diagnosis not present

## 2014-10-14 DIAGNOSIS — E785 Hyperlipidemia, unspecified: Secondary | ICD-10-CM | POA: Diagnosis not present

## 2014-10-14 LAB — HEPATIC FUNCTION PANEL
ALBUMIN: 4.1 g/dL (ref 3.5–5.2)
ALK PHOS: 80 U/L (ref 39–117)
ALT: 11 U/L (ref 0–35)
AST: 16 U/L (ref 0–37)
Bilirubin, Direct: 0.1 mg/dL (ref 0.0–0.3)
TOTAL PROTEIN: 6.7 g/dL (ref 6.0–8.3)
Total Bilirubin: 0.4 mg/dL (ref 0.2–1.2)

## 2014-10-14 LAB — CBC WITH DIFFERENTIAL/PLATELET
Basophils Absolute: 0 10*3/uL (ref 0.0–0.1)
Basophils Relative: 0.8 % (ref 0.0–3.0)
EOS ABS: 0.3 10*3/uL (ref 0.0–0.7)
EOS PCT: 5.7 % — AB (ref 0.0–5.0)
HCT: 35.7 % — ABNORMAL LOW (ref 36.0–46.0)
Hemoglobin: 12.2 g/dL (ref 12.0–15.0)
LYMPHS ABS: 1.3 10*3/uL (ref 0.7–4.0)
Lymphocytes Relative: 27.1 % (ref 12.0–46.0)
MCHC: 34.2 g/dL (ref 30.0–36.0)
MCV: 88.5 fl (ref 78.0–100.0)
MONO ABS: 0.4 10*3/uL (ref 0.1–1.0)
Monocytes Relative: 7.9 % (ref 3.0–12.0)
Neutro Abs: 2.9 10*3/uL (ref 1.4–7.7)
Neutrophils Relative %: 58.5 % (ref 43.0–77.0)
Platelets: 172 10*3/uL (ref 150.0–400.0)
RBC: 4.04 Mil/uL (ref 3.87–5.11)
RDW: 13.3 % (ref 11.5–15.5)
WBC: 4.9 10*3/uL (ref 4.0–10.5)

## 2014-10-14 LAB — BASIC METABOLIC PANEL
BUN: 20 mg/dL (ref 6–23)
CO2: 31 mEq/L (ref 19–32)
CREATININE: 0.96 mg/dL (ref 0.40–1.20)
Calcium: 9.8 mg/dL (ref 8.4–10.5)
Chloride: 104 mEq/L (ref 96–112)
GFR: 58.91 mL/min — AB (ref >60.00)
Glucose, Bld: 84 mg/dL (ref 70–99)
Potassium: 3.9 mEq/L (ref 3.5–5.1)
SODIUM: 138 meq/L (ref 135–145)

## 2014-10-14 LAB — LIPID PANEL
CHOL/HDL RATIO: 2
Cholesterol: 162 mg/dL (ref 0–200)
HDL: 65.4 mg/dL (ref >39.00)
LDL CALC: 74 mg/dL (ref 0–99)
NONHDL: 96.6
TRIGLYCERIDES: 113 mg/dL (ref 0.0–149.0)
VLDL: 22.6 mg/dL (ref 0.0–40.0)

## 2014-10-14 MED ORDER — TRAZODONE HCL 50 MG PO TABS: 25.0000 mg | ORAL_TABLET | Freq: Every evening | ORAL | Status: DC | PRN

## 2014-10-14 NOTE — Assessment & Plan Note (Signed)
Chronic problem.  Adequate control.  Asymptomatic.  Check labs.  No anticipated med changes 

## 2014-10-14 NOTE — Progress Notes (Signed)
Pre visit review using our clinic review tool, if applicable. No additional management support is needed unless otherwise documented below in the visit note. 

## 2014-10-14 NOTE — Assessment & Plan Note (Signed)
Chronic problem.  Tolerating statin w/o difficulty.  Check labs.  Adjust meds prn  

## 2014-10-14 NOTE — Patient Instructions (Signed)
Schedule your complete physical in 6 months We'll notify you of your lab results and make any changes if needed Use the Trazodone as needed Keep up the good work!  You look great! Call with any questions or concerns Happy Spring!!

## 2014-10-14 NOTE — Assessment & Plan Note (Signed)
Recurrent problem for pt.  Grandson just OD'd on drugs and pt is understandably upset.  Pt had good relief of sxs w/ outdated trazodone prescription.  New script sent.

## 2014-10-14 NOTE — Progress Notes (Signed)
   Subjective:    Patient ID: Wanda Wright, female    DOB: 1928/09/26, 81 y.o.   MRN: [id number]  HPI HTN- chronic problem, on Chlorthalidone.  No CP, SOB, HAs, visual changes, edema.  Hyperlipidemia- chronic problem, on Lipitor.  Denies abd pain, N/V, myalgias.  Insomnia- recurring problem for pt.  Grandson OD'd recently and is struggling w/ drugs.  Does not have current trazodone prescription.   Review of Systems For ROS see HPI     Objective:   Physical Exam  Constitutional: She is oriented to person, place, and time. She appears well-developed and well-nourished. No distress.  HENT:  Head: Normocephalic and atraumatic.  Eyes: Conjunctivae and EOM are normal. Pupils are equal, round, and reactive to light.  Neck: Normal range of motion. Neck supple. No thyromegaly present.  Cardiovascular: Normal rate, regular rhythm, normal heart sounds and intact distal pulses.   No murmur heard. Pulmonary/Chest: Effort normal and breath sounds normal. No respiratory distress.  Abdominal: Soft. She exhibits no distension. There is no tenderness.  Musculoskeletal: She exhibits no edema.  Lymphadenopathy:    She has no cervical adenopathy.  Neurological: She is alert and oriented to person, place, and time.  Skin: Skin is warm and dry.  Psychiatric: She has a normal mood and affect. Her behavior is normal.  Vitals reviewed.         Assessment & Plan:

## 2014-10-15 DIAGNOSIS — M19012 Primary osteoarthritis, left shoulder: Secondary | ICD-10-CM | POA: Diagnosis not present

## 2014-10-19 ENCOUNTER — Other Ambulatory Visit: Payer: Self-pay | Admitting: General Practice

## 2014-10-19 MED ORDER — TRAMADOL HCL 50 MG PO TABS: 50.0000 mg | ORAL_TABLET | Freq: Four times a day (QID) | ORAL | Status: DC | PRN

## 2014-10-19 NOTE — Telephone Encounter (Signed)
Med filled and faxed.  

## 2014-10-19 NOTE — Telephone Encounter (Signed)
Last OV 10-14-14 Tramadol last filled 09-21-14 #60 with 0

## 2014-10-27 DIAGNOSIS — H43813 Vitreous degeneration, bilateral: Secondary | ICD-10-CM | POA: Diagnosis not present

## 2014-10-27 DIAGNOSIS — H3531 Nonexudative age-related macular degeneration: Secondary | ICD-10-CM | POA: Diagnosis not present

## 2014-11-15 ENCOUNTER — Other Ambulatory Visit: Payer: Self-pay | Admitting: Family Medicine

## 2014-11-16 NOTE — Telephone Encounter (Signed)
Last OV 10-14-14 Tramadol last filled 10-19-14 #60 with 0

## 2014-11-16 NOTE — Telephone Encounter (Signed)
Med filled and faxed.  

## 2014-11-23 ENCOUNTER — Telehealth: Payer: Self-pay | Admitting: Family Medicine

## 2014-11-23 DIAGNOSIS — M19019 Primary osteoarthritis, unspecified shoulder: Secondary | ICD-10-CM

## 2014-11-23 NOTE — Telephone Encounter (Signed)
Referral placed.

## 2014-11-23 NOTE — Telephone Encounter (Signed)
Caller name: Bernhard,Deborah Relation to pt: daughter  Call back number: [phone]   Reason for call:   Daughter requesting a referral for [facility]: Marchia Bond, MD. Pt has a scheduled appointment for 12/16/14. Follow up regarding pt shoulder.

## 2014-12-06 ENCOUNTER — Other Ambulatory Visit: Payer: Self-pay | Admitting: Family Medicine

## 2014-12-07 NOTE — Telephone Encounter (Signed)
Med filled.  

## 2014-12-13 ENCOUNTER — Other Ambulatory Visit: Payer: Self-pay | Admitting: Family Medicine

## 2014-12-14 NOTE — Telephone Encounter (Signed)
Last OV 10-14-14 Tramadol 11-16-14 #60 with 0

## 2014-12-14 NOTE — Telephone Encounter (Signed)
Med filled and faxed.  

## 2014-12-16 DIAGNOSIS — M19012 Primary osteoarthritis, left shoulder: Secondary | ICD-10-CM | POA: Diagnosis not present

## 2015-01-05 ENCOUNTER — Telehealth: Payer: Self-pay | Admitting: General Practice

## 2015-01-05 ENCOUNTER — Other Ambulatory Visit: Payer: Self-pay | Admitting: General Practice

## 2015-01-05 MED ORDER — POTASSIUM CHLORIDE ER 10 MEQ PO TBCR: EXTENDED_RELEASE_TABLET | ORAL | Status: DC

## 2015-01-05 NOTE — Telephone Encounter (Signed)
Last OV 10-14-14 Tramadol last filled 12-14-14 #60 with 0  No CSC on file and med requested early. Please advise.

## 2015-01-06 MED ORDER — TRAMADOL HCL 50 MG PO TABS: 50.0000 mg | ORAL_TABLET | Freq: Four times a day (QID) | ORAL | Status: DC | PRN

## 2015-01-06 NOTE — Telephone Encounter (Signed)
Ok for #60, no refills 

## 2015-01-06 NOTE — Telephone Encounter (Signed)
Med filled.  

## 2015-02-08 ENCOUNTER — Other Ambulatory Visit: Payer: Self-pay | Admitting: Family Medicine

## 2015-02-09 NOTE — Telephone Encounter (Signed)
Last OV 10-14-14 Tramadol last filled 01-06-15 #60 with 0

## 2015-02-14 ENCOUNTER — Other Ambulatory Visit: Payer: Self-pay | Admitting: Family Medicine

## 2015-02-15 NOTE — Telephone Encounter (Signed)
Last OV 10-14-14 Tramadol last filled 02-09-15 #60 with 0  Called pharmacy and they advised that they never received that fax. Ok to fill?

## 2015-02-15 NOTE — Telephone Encounter (Signed)
Med filled and faxed.  

## 2015-02-16 ENCOUNTER — Other Ambulatory Visit: Payer: Self-pay | Admitting: Family Medicine

## 2015-02-16 NOTE — Telephone Encounter (Signed)
Med filled.  

## 2015-03-03 DIAGNOSIS — M19012 Primary osteoarthritis, left shoulder: Secondary | ICD-10-CM | POA: Diagnosis not present

## 2015-03-05 ENCOUNTER — Telehealth: Payer: Self-pay | Admitting: Family Medicine

## 2015-03-05 NOTE — Telephone Encounter (Signed)
Patient scheduled for 03/10/2015

## 2015-03-05 NOTE — Telephone Encounter (Signed)
Left a message for call back.  

## 2015-03-05 NOTE — Telephone Encounter (Signed)
Caller name: Neoma Laming Leavitt Relationship to patient: daughter Can be reached: [phone]  Reason for call: Referral to Dermatology, [facility], appt scheduled for 03/23/15, 2 bumps on back of left hand and bumps on wrist that are painful. Ortho thinks it's skin issue.

## 2015-03-07 ENCOUNTER — Other Ambulatory Visit: Payer: Self-pay | Admitting: Family Medicine

## 2015-03-08 NOTE — Telephone Encounter (Signed)
Last OV 10/14/14 Tramadol last filled 02/15/15 #60 with 0

## 2015-03-08 NOTE — Telephone Encounter (Signed)
Medication filled to pharmacy as requested.   

## 2015-03-10 ENCOUNTER — Ambulatory Visit (INDEPENDENT_AMBULATORY_CARE_PROVIDER_SITE_OTHER): Payer: Commercial Managed Care - HMO | Admitting: Family Medicine

## 2015-03-10 ENCOUNTER — Encounter: Payer: Self-pay | Admitting: Family Medicine

## 2015-03-10 VITALS — BP 136/86 | HR 55 | Temp 97.9°F | Resp 16 | Ht 63.0 in | Wt 144.2 lb

## 2015-03-10 DIAGNOSIS — N644 Mastodynia: Secondary | ICD-10-CM | POA: Diagnosis not present

## 2015-03-10 DIAGNOSIS — M79642 Pain in left hand: Secondary | ICD-10-CM

## 2015-03-10 DIAGNOSIS — D485 Neoplasm of uncertain behavior of skin: Secondary | ICD-10-CM | POA: Diagnosis not present

## 2015-03-10 NOTE — Progress Notes (Signed)
Pre visit review using our clinic review tool, if applicable. No additional management support is needed unless otherwise documented below in the visit note. 

## 2015-03-10 NOTE — Assessment & Plan Note (Signed)
Recurrent issue for pt.  Had normal mammogram after we discussed this in Feb.  Pt w/o lump or mass at area of discomfort.  No pain on PE today in office.  Pt localizes pain to scar on lateral L breast and only notes discomfort in the evening after removing her bra.  Suspect her discomfort is due to gravity straining her fibrous scar tissue after bra removal.  Encouraged pt to sleep in sports bra/genie bra and note if there is any improvement.  If not, will refer back for diagnostic mammo.  Pt expressed understanding and is in agreement w/ plan.

## 2015-03-10 NOTE — Patient Instructions (Signed)
Follow up as scheduled for your physical The white skin on the back of your hand is not causing your pain- the pain is from hitting the back of your hand and the bruised area I suspect your breast pain in the evening is because you remove your bra and then gravity pulls on that scar tissue- try sleeping in your Genie Bra and see if the pain improves.  There was no lump today Ice the back of your hand to improve the pain Call with any questions or concerns [place]!!!

## 2015-03-10 NOTE — Assessment & Plan Note (Signed)
Pt needs referral for yearly derm exam.  Referral placed.

## 2015-03-10 NOTE — Progress Notes (Signed)
   Subjective:    Patient ID: Wanda Wright, female    DOB: 12-16-1928, 81 y.o.   MRN: [id number]  HPI Breast pain- L breast at location of scar.  Pain is intermittent.  Doesn't bother her during the day but will occasionally have evening sxs.  UTD on mammo.  No lump present.  No skin changes, nipple d/c.  Lump on hand- dorsum of L hand, pt reports pain for 'a good while', possibly a month per daughter's report.  Has hx of hitting back of hand on door handles.  Pt has bruising present.   Review of Systems For ROS see HPI     Objective:   Physical Exam  Constitutional: She appears well-developed and well-nourished. No distress.  HENT:  Head: Normocephalic and atraumatic.  Pulmonary/Chest: Left breast exhibits no inverted nipple, no mass, no nipple discharge, no skin change and no tenderness.  Musculoskeletal: She exhibits tenderness (TTP over dorsum of L hand w/ bruising present, no lump appreciated). She exhibits no edema (no edema over dorsum of L hand).  Skin: Skin is warm and dry.  Psychiatric: She has a normal mood and affect. Her behavior is normal.  Vitals reviewed.         Assessment & Plan:

## 2015-03-10 NOTE — Assessment & Plan Note (Signed)
New.  Pt w/ bruising to dorsum of L hand.  Daughter reports she will frequently bang her hand on a doorknob when walking down the hall.  No mass or lump present on exam today.  Reviewed supportive care which should improve pt's pain.  Will follow.

## 2015-03-23 DIAGNOSIS — D692 Other nonthrombocytopenic purpura: Secondary | ICD-10-CM | POA: Diagnosis not present

## 2015-03-23 DIAGNOSIS — L570 Actinic keratosis: Secondary | ICD-10-CM | POA: Diagnosis not present

## 2015-03-23 DIAGNOSIS — D239 Other benign neoplasm of skin, unspecified: Secondary | ICD-10-CM | POA: Diagnosis not present

## 2015-04-04 ENCOUNTER — Other Ambulatory Visit: Payer: Self-pay | Admitting: Family Medicine

## 2015-04-05 NOTE — Telephone Encounter (Signed)
Last OV 03/10/15 Tramadol last filled 03/08/15 #60 with 0

## 2015-04-06 NOTE — Telephone Encounter (Signed)
Medication filled to pharmacy as requested.   

## 2015-04-14 ENCOUNTER — Encounter: Payer: Commercial Managed Care - HMO | Admitting: Family Medicine

## 2015-04-29 DIAGNOSIS — H3531 Nonexudative age-related macular degeneration: Secondary | ICD-10-CM | POA: Diagnosis not present

## 2015-04-29 DIAGNOSIS — H35372 Puckering of macula, left eye: Secondary | ICD-10-CM | POA: Diagnosis not present

## 2015-05-05 DIAGNOSIS — M19012 Primary osteoarthritis, left shoulder: Secondary | ICD-10-CM | POA: Diagnosis not present

## 2015-05-09 ENCOUNTER — Other Ambulatory Visit: Payer: Self-pay | Admitting: Family Medicine

## 2015-05-11 ENCOUNTER — Other Ambulatory Visit: Payer: Self-pay | Admitting: General Practice

## 2015-05-11 MED ORDER — GABAPENTIN 100 MG PO CAPS: ORAL_CAPSULE | ORAL | Status: DC

## 2015-05-11 MED ORDER — DONEPEZIL HCL 10 MG PO TABS: ORAL_TABLET | ORAL | Status: DC

## 2015-05-11 NOTE — Telephone Encounter (Signed)
Last OV 03/10/15 Tramadol last filled 8/30-16 #60 with 0  No CSC

## 2015-05-11 NOTE — Telephone Encounter (Signed)
Medication filled to pharmacy as requested.   

## 2015-05-27 ENCOUNTER — Ambulatory Visit (INDEPENDENT_AMBULATORY_CARE_PROVIDER_SITE_OTHER): Payer: Commercial Managed Care - HMO | Admitting: Family Medicine

## 2015-05-27 ENCOUNTER — Encounter: Payer: Self-pay | Admitting: Family Medicine

## 2015-05-27 VITALS — BP 132/80 | HR 100 | Temp 98.0°F | Resp 16 | Ht 63.0 in | Wt 147.5 lb

## 2015-05-27 DIAGNOSIS — I10 Essential (primary) hypertension: Secondary | ICD-10-CM | POA: Diagnosis not present

## 2015-05-27 DIAGNOSIS — Z0000 Encounter for general adult medical examination without abnormal findings: Secondary | ICD-10-CM

## 2015-05-27 DIAGNOSIS — M810 Age-related osteoporosis without current pathological fracture: Secondary | ICD-10-CM | POA: Diagnosis not present

## 2015-05-27 DIAGNOSIS — E785 Hyperlipidemia, unspecified: Secondary | ICD-10-CM | POA: Diagnosis not present

## 2015-05-27 DIAGNOSIS — Z23 Encounter for immunization: Secondary | ICD-10-CM | POA: Diagnosis not present

## 2015-05-27 NOTE — Progress Notes (Signed)
   Subjective:    Patient ID: Wanda Wright, female    DOB: Oct 14, 1928, 81 y.o.   MRN: [id number]  HPI Here today for CPE.  Risk Factors: HTN- chronic problem, on Chlorthalidone w/ good control. Hyperlipidemia- chronic problem, on Lipitor. Osteoporosis- chronic problem, UTD on DEXA.  On Ca and Vit D.  Physical Activity: walks regularly Fall Risk: mildly elevated, pt unsteady on feet Depression: denies current sxs but best friend just passed Hearing: normal to conversational tones, mildly decreased to whispered voice ADL's: independent Cognitive: normal linear thought process, some decreased memory, attention intact Home Safety: safe at home, lives w/ daughter Height, Weight, BMI, Visual Acuity: see vitals, vision corrected to 20/20 w/ glasses Counseling: UTD on mammo, due for flu today but UTD on pneumonia.  No need for pap or colonoscopy due to age. Labs Ordered: See A&P Care Plan: See A&P    Review of Systems Patient reports no vision/ hearing changes, adenopathy,fever, weight change,  persistant/recurrent hoarseness , swallowing issues, chest pain, palpitations, edema, persistant/recurrent cough, hemoptysis, dyspnea (rest/exertional/paroxysmal nocturnal), gastrointestinal bleeding (melena, rectal bleeding), abdominal pain, significant heartburn, bowel changes, GU symptoms (dysuria, hematuria, incontinence), Gyn symptoms (abnormal  bleeding, pain),  syncope, focal weakness, memory loss, numbness & tingling, skin/hair/nail changes, abnormal bruising or bleeding, anxiety, or depression.     Objective:   Physical Exam General Appearance:    Alert, cooperative, no distress, appears stated age  Head:    Normocephalic, without obvious abnormality, atraumatic  Eyes:    PERRL, conjunctiva/corneas clear, EOM's intact, fundi    benign, both eyes  Ears:    Normal TM's and external ear canals, both ears  Nose:   Nares normal, septum midline, mucosa normal, no drainage    or sinus tenderness    Throat:   Lips, mucosa, and tongue normal; teeth and gums normal  Neck:   Supple, symmetrical, trachea midline, no adenopathy;    Thyroid: no enlargement/tenderness/nodules  Back:     Symmetric, no curvature, ROM normal, no CVA tenderness  Lungs:     Clear to auscultation bilaterally, respirations unlabored  Chest Wall:    No tenderness or deformity   Heart:    Regular rate and rhythm, S1 and S2 normal, no murmur, rub   or gallop  Breast Exam:    Deferred to GYN  Abdomen:     Soft, non-tender, bowel sounds active all four quadrants,    no masses, no organomegaly  Genitalia:    Deferred to GYN  Rectal:    Extremities:   Extremities normal, atraumatic, no cyanosis or edema  Pulses:   2+ and symmetric all extremities  Skin:   Skin color, texture, turgor normal, no rashes or lesions  Lymph nodes:   Cervical, supraclavicular, and axillary nodes normal  Neurologic:   CNII-XII intact, normal strength, sensation and reflexes    throughout          Assessment & Plan:

## 2015-05-27 NOTE — Patient Instructions (Signed)
Follow up in 6 months to recheck BP and cholesterol We'll notify you of your lab results and make any changes if needed You are up to date on mammogram until Feb 2017- yay! You can discuss whether you need another colonoscopy w/ Dr Benson Norway Continue to get regular activity and try and limit your sugar intake Call with any questions or concerns If you want to join [place] at the new [place] office, any scheduled appointments will automatically transfer and we will see you at [address] [place] [address] [place], [place] [address]  Happy Fall!!!

## 2015-05-27 NOTE — Progress Notes (Signed)
Pre visit review using our clinic review tool, if applicable. No additional management support is needed unless otherwise documented below in the visit note. 

## 2015-05-28 LAB — HEPATIC FUNCTION PANEL
ALBUMIN: 4 g/dL (ref 3.5–5.2)
ALT: 11 U/L (ref 0–35)
AST: 18 U/L (ref 0–37)
Alkaline Phosphatase: 89 U/L (ref 39–117)
Bilirubin, Direct: 0.1 mg/dL (ref 0.0–0.3)
Total Bilirubin: 0.6 mg/dL (ref 0.2–1.2)
Total Protein: 6.9 g/dL (ref 6.0–8.3)

## 2015-05-28 LAB — LIPID PANEL
CHOL/HDL RATIO: 3
Cholesterol: 176 mg/dL (ref 0–200)
HDL: 67.1 mg/dL (ref >39.00)
LDL CALC: 87 mg/dL (ref 0–99)
NonHDL: 108.55
TRIGLYCERIDES: 110 mg/dL (ref 0.0–149.0)
VLDL: 22 mg/dL (ref 0.0–40.0)

## 2015-05-28 LAB — BASIC METABOLIC PANEL
BUN: 21 mg/dL (ref 6–23)
CO2: 30 mEq/L (ref 19–32)
CREATININE: 1.07 mg/dL (ref 0.40–1.20)
Calcium: 10 mg/dL (ref 8.4–10.5)
Chloride: 102 mEq/L (ref 96–112)
GFR: 51.9 mL/min — AB (ref >60.00)
Glucose, Bld: 88 mg/dL (ref 70–99)
Potassium: 4 mEq/L (ref 3.5–5.1)
Sodium: 142 mEq/L (ref 135–145)

## 2015-05-28 LAB — CBC WITH DIFFERENTIAL/PLATELET
BASOS ABS: 0 10*3/uL (ref 0.0–0.1)
Basophils Relative: 0.2 % (ref 0.0–3.0)
Eosinophils Absolute: 0.2 10*3/uL (ref 0.0–0.7)
Eosinophils Relative: 3.9 % (ref 0.0–5.0)
HCT: 39.2 % (ref 36.0–46.0)
Hemoglobin: 12.8 g/dL (ref 12.0–15.0)
Lymphocytes Relative: 11.2 % — ABNORMAL LOW (ref 12.0–46.0)
Lymphs Abs: 0.5 10*3/uL — ABNORMAL LOW (ref 0.7–4.0)
MCHC: 32.7 g/dL (ref 30.0–36.0)
MCV: 91.7 fl (ref 78.0–100.0)
MONO ABS: 0.4 10*3/uL (ref 0.1–1.0)
Monocytes Relative: 8.2 % (ref 3.0–12.0)
Neutro Abs: 3.6 10*3/uL (ref 1.4–7.7)
Neutrophils Relative %: 76.5 % (ref 43.0–77.0)
Platelets: 161 10*3/uL (ref 150.0–400.0)
RBC: 4.27 Mil/uL (ref 3.87–5.11)
RDW: 14.1 % (ref 11.5–15.5)
WBC: 4.8 10*3/uL (ref 4.0–10.5)

## 2015-05-28 LAB — VITAMIN D 25 HYDROXY (VIT D DEFICIENCY, FRACTURES): VITD: 44.31 ng/mL (ref 30.00–100.00)

## 2015-05-28 LAB — TSH: TSH: 0.94 u[IU]/mL (ref 0.35–4.50)

## 2015-05-28 NOTE — Assessment & Plan Note (Signed)
Chronic problem.  UTD on DEXA.  On Ca and Vit D.  Check Vit D level.  Replete prn. 

## 2015-05-28 NOTE — Assessment & Plan Note (Signed)
Pt's PE WNL for age and unchanged from previous.  UTD on mammo and DEXA.  Written screening schedule updated and given to pt. Due for colonoscopy but based on age, she and Dr Benson Norway will discuss this.  UTD on pneumonia vaccines, flu shot given today.  Check labs.  Anticipatory guidance provided.

## 2015-05-28 NOTE — Assessment & Plan Note (Signed)
Chronic problem.  Tolerating statin w/o difficulty.  Check labs.  Adjust meds prn  

## 2015-05-28 NOTE — Assessment & Plan Note (Signed)
Chronic problem.  Good control on chlorthalidone.  Asymptomatic.  Check labs.  No anticipated med changes.

## 2015-06-04 ENCOUNTER — Telehealth: Payer: Self-pay | Admitting: Family Medicine

## 2015-06-04 DIAGNOSIS — R0982 Postnasal drip: Secondary | ICD-10-CM

## 2015-06-04 NOTE — Telephone Encounter (Signed)
Pt needs referral to ENT for congestion and choking on mucus and excessive drainage. Previously had tubes in her ear. Pt goes to Dr. Melida Quitter on [address] in [place]. Need referral sent so pt can make appt.

## 2015-06-04 NOTE — Telephone Encounter (Signed)
Referral placed at pt request.

## 2015-06-05 ENCOUNTER — Other Ambulatory Visit: Payer: Self-pay | Admitting: Family Medicine

## 2015-06-06 ENCOUNTER — Other Ambulatory Visit: Payer: Self-pay | Admitting: Family Medicine

## 2015-06-07 NOTE — Telephone Encounter (Signed)
Medication filled to pharmacy as requested.   

## 2015-06-07 NOTE — Telephone Encounter (Signed)
Last OV 05/27/15 Tramadol last filled 05/11/15 #60 with 0

## 2015-06-15 DIAGNOSIS — J300 Vasomotor rhinitis: Secondary | ICD-10-CM | POA: Diagnosis not present

## 2015-06-15 DIAGNOSIS — H6121 Impacted cerumen, right ear: Secondary | ICD-10-CM | POA: Diagnosis not present

## 2015-07-04 ENCOUNTER — Other Ambulatory Visit: Payer: Self-pay | Admitting: Family Medicine

## 2015-07-05 NOTE — Telephone Encounter (Signed)
Last OV 05-27-15 Tramadol last filled 06/07/15 #60 with 0

## 2015-07-05 NOTE — Telephone Encounter (Signed)
Medication filled to pharmacy as requested.   

## 2015-07-07 DIAGNOSIS — M19012 Primary osteoarthritis, left shoulder: Secondary | ICD-10-CM | POA: Diagnosis not present

## 2015-07-20 ENCOUNTER — Other Ambulatory Visit: Payer: Self-pay | Admitting: Family Medicine

## 2015-07-20 NOTE — Telephone Encounter (Signed)
Medication filled to pharmacy as requested.   

## 2015-07-23 ENCOUNTER — Telehealth: Payer: Self-pay | Admitting: Family Medicine

## 2015-07-23 DIAGNOSIS — Z0100 Encounter for examination of eyes and vision without abnormal findings: Secondary | ICD-10-CM

## 2015-07-23 NOTE — Telephone Encounter (Signed)
Referral placed today

## 2015-07-23 NOTE — Telephone Encounter (Signed)
Caller name: Hilda Blades  Can be [phone]    Reason for call: Request referral to Dr. Ellie Lunch (Optho) @ [facility]. Patient had appointment today but did not know she needed a referral. She rescheduled her appointment.

## 2015-08-01 ENCOUNTER — Other Ambulatory Visit: Payer: Self-pay | Admitting: Family Medicine

## 2015-08-03 NOTE — Telephone Encounter (Signed)
Last OV 05/27/15 Tramadol last filled 07/05/15 #60 with 0

## 2015-08-03 NOTE — Telephone Encounter (Signed)
Medication filled to pharmacy as requested.   

## 2015-08-10 ENCOUNTER — Encounter (HOSPITAL_COMMUNITY): Payer: Self-pay

## 2015-08-10 ENCOUNTER — Emergency Department (HOSPITAL_COMMUNITY): Payer: Commercial Managed Care - HMO

## 2015-08-10 ENCOUNTER — Emergency Department (HOSPITAL_COMMUNITY)
Admission: EM | Admit: 2015-08-10 | Discharge: 2015-08-10 | Disposition: A | Discharge status: Home / Self Care | Payer: Commercial Managed Care - HMO | Attending: Emergency Medicine | Admitting: Emergency Medicine

## 2015-08-10 DIAGNOSIS — R42 Dizziness and giddiness: Secondary | ICD-10-CM | POA: Diagnosis not present

## 2015-08-10 DIAGNOSIS — Z86010 Personal history of colonic polyps: Secondary | ICD-10-CM | POA: Insufficient documentation

## 2015-08-10 DIAGNOSIS — R404 Transient alteration of awareness: Secondary | ICD-10-CM | POA: Diagnosis not present

## 2015-08-10 DIAGNOSIS — Z8669 Personal history of other diseases of the nervous system and sense organs: Secondary | ICD-10-CM | POA: Insufficient documentation

## 2015-08-10 DIAGNOSIS — M47812 Spondylosis without myelopathy or radiculopathy, cervical region: Secondary | ICD-10-CM | POA: Insufficient documentation

## 2015-08-10 DIAGNOSIS — I2510 Atherosclerotic heart disease of native coronary artery without angina pectoris: Secondary | ICD-10-CM | POA: Insufficient documentation

## 2015-08-10 DIAGNOSIS — I951 Orthostatic hypotension: Secondary | ICD-10-CM | POA: Insufficient documentation

## 2015-08-10 DIAGNOSIS — I10 Essential (primary) hypertension: Secondary | ICD-10-CM | POA: Insufficient documentation

## 2015-08-10 DIAGNOSIS — Z8709 Personal history of other diseases of the respiratory system: Secondary | ICD-10-CM | POA: Diagnosis not present

## 2015-08-10 DIAGNOSIS — Z79899 Other long term (current) drug therapy: Secondary | ICD-10-CM | POA: Insufficient documentation

## 2015-08-10 DIAGNOSIS — Z87891 Personal history of nicotine dependence: Secondary | ICD-10-CM | POA: Diagnosis not present

## 2015-08-10 DIAGNOSIS — Z951 Presence of aortocoronary bypass graft: Secondary | ICD-10-CM | POA: Diagnosis not present

## 2015-08-10 DIAGNOSIS — R55 Syncope and collapse: Secondary | ICD-10-CM | POA: Diagnosis not present

## 2015-08-10 DIAGNOSIS — E785 Hyperlipidemia, unspecified: Secondary | ICD-10-CM | POA: Diagnosis not present

## 2015-08-10 DIAGNOSIS — Z7982 Long term (current) use of aspirin: Secondary | ICD-10-CM | POA: Diagnosis not present

## 2015-08-10 DIAGNOSIS — R001 Bradycardia, unspecified: Secondary | ICD-10-CM | POA: Diagnosis not present

## 2015-08-10 LAB — BASIC METABOLIC PANEL
Anion gap: 13 (ref 5–15)
BUN: 17 mg/dL (ref 6–20)
CALCIUM: 9.2 mg/dL (ref 8.9–10.3)
CHLORIDE: 104 mmol/L (ref 101–111)
CO2: 24 mmol/L (ref 22–32)
CREATININE: 1.16 mg/dL — AB (ref 0.44–1.00)
GFR calc non Af Amer: 42 mL/min — ABNORMAL LOW (ref >60)
GFR, EST AFRICAN AMERICAN: 49 mL/min — AB (ref >60)
Glucose, Bld: 105 mg/dL — ABNORMAL HIGH (ref 65–99)
Potassium: 3.3 mmol/L — ABNORMAL LOW (ref 3.5–5.1)
Sodium: 141 mmol/L (ref 135–145)

## 2015-08-10 LAB — URINALYSIS, ROUTINE W REFLEX MICROSCOPIC
BILIRUBIN URINE: NEGATIVE
GLUCOSE, UA: NEGATIVE mg/dL
HGB URINE DIPSTICK: NEGATIVE
KETONES UR: NEGATIVE mg/dL
Leukocytes, UA: NEGATIVE
Nitrite: NEGATIVE
Protein, ur: NEGATIVE mg/dL
Specific Gravity, Urine: 1.008 (ref 1.005–1.030)
pH: 7.5 (ref 5.0–8.0)

## 2015-08-10 LAB — CBC
HCT: 40.8 % (ref 36.0–46.0)
Hemoglobin: 13.3 g/dL (ref 12.0–15.0)
MCH: 29.6 pg (ref 26.0–34.0)
MCHC: 32.6 g/dL (ref 30.0–36.0)
MCV: 90.9 fL (ref 78.0–100.0)
PLATELETS: 177 10*3/uL (ref 150–400)
RBC: 4.49 MIL/uL (ref 3.87–5.11)
RDW: 12.7 % (ref 11.5–15.5)
WBC: 5.1 10*3/uL (ref 4.0–10.5)

## 2015-08-10 LAB — I-STAT TROPONIN, ED: TROPONIN I, POC: 0.01 ng/mL (ref 0.00–0.08)

## 2015-08-10 LAB — CBG MONITORING, ED: GLUCOSE-CAPILLARY: 94 mg/dL (ref 65–99)

## 2015-08-10 MED ORDER — LORAZEPAM 2 MG/ML IJ SOLN
0.5000 mg | Freq: Once | INTRAMUSCULAR | Status: AC
  Administered 2015-08-10: 0.5 mg via INTRAVENOUS
  Filled 2015-08-10: qty 1

## 2015-08-10 NOTE — ED Notes (Signed)
Pt comes from home via [facility], c/o dizziness and near syncope episode. Pt has hx of dementia, AxO x3, was going to bathroom, became dizzy and went to sit down on bed. Sinus brady with HR in 50's

## 2015-08-10 NOTE — Discharge Instructions (Signed)
Dizziness Dizziness is a common problem. It is a feeling of unsteadiness or light-headedness. You may feel like you are about to faint. Dizziness can lead to injury if you stumble or fall. Anyone can become dizzy, but dizziness is more common in older adults. This condition can be caused by a number of things, including medicines, dehydration, or illness. HOME CARE INSTRUCTIONS Taking these steps may help with your condition: Eating and Drinking  Drink enough fluid to keep your urine clear or pale yellow. This helps to keep you from becoming dehydrated. Try to drink more clear fluids, such as water.  Do not drink alcohol.  Limit your caffeine intake if directed by your health care provider.  Limit your salt intake if directed by your health care provider. Activity  Avoid making quick movements.  Rise slowly from chairs and steady yourself until you feel okay.  In the morning, first sit up on the side of the bed. When you feel okay, stand slowly while you hold onto something until you know that your balance is fine.  Move your legs often if you need to stand in one place for a long time. Tighten and relax your muscles in your legs while you are standing.  Do not drive or operate heavy machinery if you feel dizzy.  Avoid bending down if you feel dizzy. Place items in your home so that they are easy for you to reach without leaning over. Lifestyle  Do not use any tobacco products, including cigarettes, chewing tobacco, or electronic cigarettes. If you need help quitting, ask your health care provider.  Try to reduce your stress level, such as with yoga or meditation. Talk with your health care provider if you need help. General Instructions  Watch your dizziness for any changes.  Take medicines only as directed by your health care provider. Talk with your health care provider if you think that your dizziness is caused by a medicine that you are taking.  Tell a friend or a family  member that you are feeling dizzy. If he or she notices any changes in your behavior, have this person call your health care provider.  Keep all follow-up visits as directed by your health care provider. This is important. SEEK MEDICAL CARE IF:  Your dizziness does not go away.  Your dizziness or light-headedness gets worse.  You feel nauseous.  You have reduced hearing.  You have new symptoms.  You are unsteady on your feet or you feel like the room is spinning. SEEK IMMEDIATE MEDICAL CARE IF:  You vomit or have diarrhea and are unable to eat or drink anything.  You have problems talking, walking, swallowing, or using your arms, hands, or legs.  You feel generally weak.  You are not thinking clearly or you have trouble forming sentences. It may take a friend or family member to notice this.  You have chest pain, abdominal pain, shortness of breath, or sweating.  Your vision changes.  You notice any bleeding.  You have a headache.  You have neck pain or a stiff neck.  You have a fever.   This information is not intended to replace advice given to you by your health care provider. Make sure you discuss any questions you have with your health care provider.   Document Released: 01/17/2001 Document Revised: 12/08/2014 Document Reviewed: 07/20/2014 Elsevier Interactive Patient Education 2016 [employer]  Orthostatic Hypotension Orthostatic hypotension is a sudden drop in blood pressure. It happens when you quickly stand up  from a seated or lying position. You may feel dizzy or light-headed. This can last for just a few seconds or for up to a few minutes. It is usually not a serious problem. However, if this happens frequently or gets worse, it can be a sign of something more serious. CAUSES  Different things can cause orthostatic hypotension, including:   Loss of body fluids (dehydration).  Medicines that lower blood pressure.  Sudden changes in posture, such as  standing up quickly after you have been sitting or lying down.  Taking too much of your medicine. SIGNS AND SYMPTOMS   Light-headedness or dizziness.   Fainting or near-fainting.   A fast heart rate.   Weakness.   Feeling tired (fatigue).  DIAGNOSIS  Your health care provider may do several things to help diagnose your condition and identify the cause. These may include:   Taking a medical history and doing a physical exam.  Checking your blood pressure. Your health care provider will check your blood pressure when you are:  Lying down.  Sitting.  Standing.  Using tilt table testing. In this test, you lie down on a table that moves from a lying position to a standing position. You will be strapped onto the table. This test monitors your blood pressure and heart rate when you are in different positions. TREATMENT  Treatment will vary depending on the cause. Possible treatments include:   Changing the dosage of your medicines.  Wearing compression stockings on your lower legs.  Standing up slowly after sitting or lying down.  Eating more salt.  Eating frequent, small meals.  In some cases, getting IV fluids.  Taking medicine to enhance fluid retention. HOME CARE INSTRUCTIONS  Only take over-the-counter or prescription medicines as directed by your health care provider.  Follow your health care provider's instructions for changing the dosage of your current medicines.  Do not stop or adjust your medicine on your own.  Stand up slowly after sitting or lying down. This allows your body to adjust to the different position.  Wear compression stockings as directed.  Eat extra salt as directed.  Do not add extra salt to your diet unless directed to by your health care provider.  Eat frequent, small meals.  Avoid standing suddenly after eating.  Avoid hot showers or excessive heat as directed by your health care provider.  Keep all follow-up  appointments. SEEK MEDICAL CARE IF:  You continue to feel dizzy or light-headed after standing.  You feel groggy or confused.  You feel cold, clammy, or sick to your stomach (nauseous).  You have blurred vision.  You feel short of breath. SEEK IMMEDIATE MEDICAL CARE IF:   You faint after standing.  You have chest pain.  You have difficulty breathing.   You lose feeling or movement in your arms or legs.   You have slurred speech or difficulty talking, or you are unable to talk.  MAKE SURE YOU:   Understand these instructions.  Will watch your condition.  Will get help right away if you are not doing well or get worse.   This information is not intended to replace advice given to you by your health care provider. Make sure you discuss any questions you have with your health care provider.   Document Released: 07/14/2002 Document Revised: 07/29/2013 Document Reviewed: 05/16/2013 Elsevier Interactive Patient Education [employer].

## 2015-08-10 NOTE — ED Notes (Signed)
Patient transported to CT 

## 2015-08-10 NOTE — ED Provider Notes (Signed)
CSN: [id number]     Arrival date & time 08/10/15  0044 History  By signing my name below, I, Evelene Croon, attest that this documentation has been prepared under the direction and in the presence of Julianne Rice, MD . Electronically Signed: Evelene Croon, Scribe. 08/10/2015. 2:48 AM.    Chief Complaint  Patient presents with  . Near Syncope     The history is provided by the patient. No language interpreter was used.     HPI Comments:  Wanda Wright is a 82 y.o. female with a history of  CAD, HLD, HTN, and CABG, who presents to the Emergency Department via EMS complaining of lightheadedness, onset 2230-2245 last night. Pt states she was walking to her room when symptom began. She notes her symptom persisted after she got into bed. Pt denies dizziness at this time.  She also denies nausea, recent changes in speech/vision, chest pain, SOB, focal weakness, or numbness. No recent changes in medications or recent missed doses. No alleviating factors noted.  Past Medical History  Diagnosis Date  . 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 Hyperlipidemia   . Hypertension   . Hemidiaphragm     elevated hemidiaphragm  . Colon polyps   . Osteoarthritis cervical spine   . Hearing loss   . Allergic rhinitis   . Carotid artery disease ([place])     Doppler, October, 2011, 0-39% bilateral  . Colon polyp   . Hx of CABG     2004  . Ejection fraction     normal, catheterization, 2009  . Bradycardia     January, 2013   Past Surgical History  Procedure Laterality Date  . Total abdominal hysterectomy    . Hiatal hernia repair    . Coronary artery bypass graft      ACS  . Cholecystectomy    . Breast lumpectomy      multiple, all benign  . Colonoscopy w/ polypectomy     Family History  Problem Relation Age of Onset  . Heart attack Father   . Stroke Father   . Dementia Mother   .  Breast cancer Mother    Social History  Substance Use Topics  . Smoking status: Former Smoker    Types: Cigarettes  . Smokeless tobacco: None  . Alcohol Use: No   OB History    No data available     Review of Systems  Constitutional: Negative for fever and chills.  Eyes: Negative for visual disturbance.  Respiratory: Negative for cough and shortness of breath.   Cardiovascular: Negative for chest pain, palpitations and leg swelling.  Gastrointestinal: Negative for nausea, vomiting, abdominal pain, diarrhea and constipation.  Genitourinary: Negative for dysuria, flank pain and difficulty urinating.  Musculoskeletal: Negative for back pain and neck pain.  Skin: Negative for rash and wound.  Neurological: Positive for dizziness and light-headedness. Negative for syncope, speech difficulty, weakness, numbness and headaches.  All other systems reviewed and are negative.   Allergies  Codeine; Horse-derived products; and Tetanus toxoid  Home Medications   Prior to Admission medications   Medication Sig Start Date End Date Taking? Authorizing Provider  acetaminophen (TYLENOL) 500 MG tablet Take 1,000 mg by mouth 2 (two) times daily.    Yes Historical Provider, MD  aspirin 81 MG tablet Take 81 mg by mouth daily.     Yes Historical  Provider, MD  atorvastatin (LIPITOR) 20 MG tablet Take 10 mg by mouth daily.   Yes Historical Provider, MD  Calcium Carbonate-Vitamin D 600-400 MG-UNIT per tablet Take 2 tablets by mouth daily.   Yes Historical Provider, MD  chlorthalidone (HYGROTON) 25 MG tablet Take 12.5 mg by mouth daily.   Yes Historical Provider, MD  cholecalciferol (VITAMIN D) 1000 UNITS tablet Take 1,000 Units by mouth daily.   Yes Historical Provider, MD  donepezil (ARICEPT) 10 MG tablet TAKE 1 TABLET (10 MG TOTAL) BY MOUTH AT BEDTIME. 05/11/15  Yes Midge Minium, MD  gabapentin (NEURONTIN) 100 MG capsule TAKE 1 CAPSULE BY MOUTH 3 TIMES DAILY AS NEEDED. Patient taking  differently: Take 100 mg by mouth 2 (two) times daily.  05/11/15  Yes Midge Minium, MD  ipratropium (ATROVENT) 0.06 % nasal spray Place 2 sprays into both nostrils 2 (two) times daily.   Yes Historical Provider, MD  nitroGLYCERIN (NITROSTAT) 0.4 MG SL tablet Place 0.4 mg under the tongue every 5 (five) minutes as needed for chest pain.   Yes Historical Provider, MD  polyethylene glycol (MIRALAX / GLYCOLAX) packet Take 17 g by mouth every other day.    Yes Historical Provider, MD  potassium citrate (UROCIT-K) 10 MEQ (1080 MG) SR tablet Take 20 mEq by mouth every evening.   Yes Historical Provider, MD  traMADol (ULTRAM) 50 MG tablet Take 50 mg by mouth 2 (two) times daily.   Yes Historical Provider, MD  traZODone (DESYREL) 50 MG tablet Take 0.5-1 tablets (25-50 mg total) by mouth at bedtime as needed for sleep. Patient taking differently: Take 25 mg by mouth at bedtime as needed for sleep.  10/14/14  Yes Midge Minium, MD  atorvastatin (LIPITOR) 20 MG tablet TAKE 1 TABLET BY MOUTH AS DIRECTED Patient not taking: Reported on 08/10/2015 06/07/15   Midge Minium, MD  chlorthalidone (HYGROTON) 25 MG tablet TAKE AS DIRECTED Patient not taking: Reported on 08/10/2015 02/09/15   Midge Minium, MD  fluticasone [facility]) 50 MCG/ACT nasal spray Place 2 sprays into both nostrils daily. Patient not taking: Reported on 08/10/2015 05/25/14   Midge Minium, MD  KLOR-CON 10 10 MEQ tablet TAKE 1 TABLET BY MOUTH 2 TIMES DAILY. Patient not taking: Reported on 08/10/2015 07/20/15   Midge Minium, MD  traMADol (ULTRAM) 50 MG tablet TAKE 1 TABLET BY MOUTH EVERY 6 HOURS AS NEEDED FOR PAIN Patient not taking: Reported on 08/10/2015 08/03/15   Midge Minium, MD   BP 133/85 mmHg  Pulse 54  Temp(Src) 97.5 F (36.4 C)  Resp 14  Ht 5\' 3"  (1.6 m)  Wt 147 lb (66.679 kg)  BMI 26.05 kg/m2  SpO2 99% Physical Exam  Constitutional: She is oriented to person, place, and time. She appears well-developed  and well-nourished. No distress.  HENT:  Head: Normocephalic and atraumatic.  Mouth/Throat: Oropharynx is clear and moist.  Eyes: EOM are normal. Pupils are equal, round, and reactive to light.  No nystagmus appreciated  Neck: Normal range of motion. Neck supple.  No meningismus  Cardiovascular: Regular rhythm.  Exam reveals no gallop and no friction rub.   No murmur heard. Bradycardia  Pulmonary/Chest: Effort normal and breath sounds normal. No respiratory distress. She has no wheezes. She has no rales.  Abdominal: Soft. Bowel sounds are normal. She exhibits no distension and no mass. There is no tenderness. There is no rebound and no guarding.  Musculoskeletal: Normal range of motion. She exhibits no edema  or tenderness.  Neurological: She is alert and oriented to person, place, and time.  5/5 motor in all extremities. Sensation is fully intact. Cranial nerves II through XII intact. Patient does have some dysmetria with bilateral finger to nose and appears to be worse on the right.  Skin: Skin is warm and dry. No rash noted. No erythema.  Psychiatric: She has a normal mood and affect. Her behavior is normal.  Nursing note and vitals reviewed.   ED Course  Procedures   DIAGNOSTIC STUDIES:  Oxygen Saturation is 100% on RA, normal by my interpretation.    COORDINATION OF CARE:  2:07 AM Discussed treatment plan with pt at bedside and pt agreed to plan.  Labs Review Labs Reviewed  BASIC METABOLIC PANEL - Abnormal; Notable for the following:    Potassium 3.3 (*)    Glucose, Bld 105 (*)    Creatinine, Ser 1.16 (*)    GFR calc non Af Amer 42 (*)    GFR calc Af Amer 49 (*)    All other components within normal limits  URINALYSIS, ROUTINE W REFLEX MICROSCOPIC (NOT AT [facility]) - Abnormal; Notable for the following:    APPearance CLOUDY (*)    All other components within normal limits  CBC  CBG MONITORING, ED  Randolm Idol, ED    Imaging Review Mr Brain Wo  Contrast  08/10/2015  CLINICAL DATA:  Initial evaluation for acute dizziness, near syncope. EXAM: MRI HEAD WITHOUT CONTRAST TECHNIQUE: Multiplanar, multiecho pulse sequences of the brain and surrounding structures were obtained without intravenous contrast. COMPARISON:  None. FINDINGS: Diffuse prominence of the CSF containing spaces is consistent with generalized age-related cerebral atrophy. Mild patchy T2/FLAIR hyperintensity within the periventricular and deep white matter both cerebral hemispheres most consistent with chronic small vessel ischemic disease, mild for age. No abnormal foci of restricted diffusion to suggest acute intracranial infarct. Gray-white matter differentiation maintained. Normal intravascular flow voids are preserved. Atheromatous plaque noted within the left V4 segment. No acute or chronic intracranial hemorrhage. No areas of chronic infarction. No mass lesion, midline shift or mass effect. Ventricular prominence related to global parenchymal volume loss present without hydrocephalus. No extra-axial fluid collection. Craniocervical junction within normal limits. Mild degenerative spondylolysis noted within the visualized upper cervical spine without stenosis. Pituitary gland within normal limits. No acute abnormality about the orbits. Sequela prior bilateral lens extraction noted. Axial myopia noted. Mild mucosal thickening within the maxillary sinuses. Paranasal sinuses are otherwise clear. Right mastoid effusion noted. Inner ear structures grossly normal. Bone marrow signal intensity within normal limits. Scalp soft tissues unremarkable. IMPRESSION: 1. No acute intracranial process identified. 2. Age-related cerebral atrophy with mild chronic small vessel ischemic disease. 3. Right mastoid effusion. Electronically Signed   By: Jeannine Boga M.D.   On: 08/10/2015 04:20   I have personally reviewed and evaluated these images and lab results as part of my medical  decision-making.   EKG Interpretation   Date/Time:  Tuesday August 10 2015 00:50:58 EST Ventricular Rate:  54 PR Interval:  171 QRS Duration: 111 QT Interval:  539 QTC Calculation: 511 R Axis:   67 Text Interpretation:  Sinus rhythm Abnormal R-wave progression, early  transition Borderline ST depression, anterolateral leads Prolonged QT  interval Confirmed by Lita Mains  MD, Arlyne Brandes ([id number]) on 08/10/2015 1:51:42 AM      MDM   Final diagnoses:  Orthostasis  Dizziness    I personally performed the services described in this documentation, which was scribed in my presence. The  recorded information has been reviewed and is accurate.    Patient with mild orthostasis. Ambulating without any difficulty. No evidence of acute stroke on MRI. Advised follow-up with the patient's primary physician. Return precautions have been given.  Julianne Rice, MD 08/10/15 [phone]

## 2015-08-10 NOTE — ED Notes (Signed)
Ambulated pt. Pt did not need any assistance. Pt denied feeling dizzy. Pt denied feeling weak. Check BP after walking BP 132/79. Placed pt back in bed and hooked back to Monitor.

## 2015-08-13 ENCOUNTER — Telehealth: Payer: Self-pay | Admitting: Family Medicine

## 2015-08-13 ENCOUNTER — Encounter: Payer: Self-pay | Admitting: Family Medicine

## 2015-08-13 ENCOUNTER — Ambulatory Visit (INDEPENDENT_AMBULATORY_CARE_PROVIDER_SITE_OTHER): Payer: Commercial Managed Care - HMO | Admitting: Family Medicine

## 2015-08-13 VITALS — BP 118/78 | HR 56 | Temp 97.9°F | Resp 14 | Ht 63.0 in | Wt 147.2 lb

## 2015-08-13 DIAGNOSIS — R5383 Other fatigue: Secondary | ICD-10-CM

## 2015-08-13 DIAGNOSIS — M25511 Pain in right shoulder: Secondary | ICD-10-CM

## 2015-08-13 DIAGNOSIS — R5381 Other malaise: Secondary | ICD-10-CM

## 2015-08-13 DIAGNOSIS — I25810 Atherosclerosis of coronary artery bypass graft(s) without angina pectoris: Secondary | ICD-10-CM | POA: Diagnosis not present

## 2015-08-13 LAB — BASIC METABOLIC PANEL
BUN: 28 mg/dL — AB (ref 7–25)
CHLORIDE: 103 mmol/L (ref 98–110)
CO2: 28 mmol/L (ref 20–31)
CREATININE: 1.11 mg/dL — AB (ref 0.60–0.88)
Calcium: 9.5 mg/dL (ref 8.6–10.4)
Glucose, Bld: 93 mg/dL (ref 65–99)
Potassium: 3.8 mmol/L (ref 3.5–5.3)
Sodium: 140 mmol/L (ref 135–146)

## 2015-08-13 NOTE — Progress Notes (Signed)
Pre visit review using our clinic review tool, if applicable. No additional management support is needed unless otherwise documented below in the visit note/SLS  

## 2015-08-13 NOTE — Assessment & Plan Note (Signed)
Referral provided for new Cardiologist

## 2015-08-13 NOTE — Telephone Encounter (Signed)
Caller name: Neoma Laming  Relationship to patient: daughter  Can be reached: [phone]   Reason for call: they are requesting a referral to a shoulder dr. Due to pt's shoulder pain.  She would like to have it go to [employer] [facility] [employer]

## 2015-08-13 NOTE — Patient Instructions (Addendum)
Follow up as needed/scheduled We'll notify you of your lab results and make any changes if needed Drink more water Try not to overdue it!!! Call with any questions or concerns Happy New Year!!!

## 2015-08-13 NOTE — Assessment & Plan Note (Signed)
Recurrent problem for pt.  Suspect she overdid herself while babysitting her 2.80 yr old granddaughter.  No longer having orthostatic episodes nor complains of dizziness.  Recheck BMP due to low K+.  PE WNL.  Reviewed supportive care and red flags that should prompt return.  Pt expressed understanding and is in agreement w/ plan.

## 2015-08-13 NOTE — Progress Notes (Signed)
   Subjective:    Patient ID: NEDA STUTHEIT, female    DOB: [birth year], 82 y.o.   MRN: [id number]  HPI ER f/u- pt went to ER 1/3 after becoming dizzy.  She was found to be mildly orthostatic on exam and other than mildly low K+ had no other findings.  Pt denies similar sxs since ER visit.  Pt's only complaint is fatigue.  Pt was sleeping 'all day Tuesday and Wednesday'.     Review of Systems For ROS see HPI     Objective:   Physical Exam  Constitutional: She is oriented to person, place, and time. She appears well-developed and well-nourished. No distress.  HENT:  Head: Normocephalic and atraumatic.  Eyes: Conjunctivae and EOM are normal. Pupils are equal, round, and reactive to light.  Neck: Normal range of motion. Neck supple.  Cardiovascular: Normal rate, regular rhythm and normal heart sounds.   Pulmonary/Chest: Effort normal and breath sounds normal. No respiratory distress. She has no wheezes. She has no rales.  Lymphadenopathy:    She has no cervical adenopathy.  Neurological: She is alert and oriented to person, place, and time.  Skin: Skin is warm and dry.  Psychiatric: She has a normal mood and affect. Her behavior is normal. Thought content normal.  Vitals reviewed.         Assessment & Plan:

## 2015-08-18 NOTE — Telephone Encounter (Signed)
Ok to refer.  Pt has seen Dr Mardelle Matte at [facility] earlier this year for the same shoulder pain

## 2015-08-18 NOTE — Telephone Encounter (Signed)
Referral placed.

## 2015-08-29 ENCOUNTER — Other Ambulatory Visit: Payer: Self-pay | Admitting: Family Medicine

## 2015-08-30 ENCOUNTER — Other Ambulatory Visit: Payer: Self-pay | Admitting: Family Medicine

## 2015-08-30 NOTE — Telephone Encounter (Signed)
Last OV: 08/13/2015 Last filled: Unknown, historical provider Sig: TAKE 1 TABLET BY MOUTH EVERY 6 HOURS AS NEEDED FOR PAIN UDS: None

## 2015-08-30 NOTE — Telephone Encounter (Signed)
Medication filled to pharmacy as requested.   

## 2015-08-31 ENCOUNTER — Encounter: Payer: Self-pay | Admitting: Cardiology

## 2015-09-03 NOTE — Progress Notes (Signed)
HPI: FU CAD. Previously followed by Dr Ron Parker. She underwent bypass surgery in 2004. Cardiac catheterization in 2009 showed an occluded left main and a patent RCA. 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 Saphenous vein graft to the ramus intermedius was patent. The LIMA to the LAD was patent. Ejection fraction 60%. Carotid Dopplers 2011 showed no significant stenosis. Since last seen the patient denies any dyspnea on exertion, orthopnea, PND, pedal edema, palpitations, syncope or chest pain. She was seen in emergency room in early January with some dizziness.   Current Outpatient Prescriptions  Medication Sig Dispense Refill  . acetaminophen (TYLENOL) 500 MG tablet Take 1,000 mg by mouth 2 (two) times daily.     Marland Kitchen aspirin 81 MG tablet Take 81 mg by mouth daily.      Marland Kitchen atorvastatin (LIPITOR) 20 MG tablet Take 10 mg by mouth daily.    . Calcium Carbonate-Vitamin D 600-400 MG-UNIT per tablet Take 2 tablets by mouth daily.    . chlorthalidone (HYGROTON) 25 MG tablet TAKE AS DIRECTED 30 tablet 3  . cholecalciferol (VITAMIN D) 1000 UNITS tablet Take 1,000 Units by mouth daily.    Marland Kitchen donepezil (ARICEPT) 10 MG tablet TAKE 1 TABLET (10 MG TOTAL) BY MOUTH AT BEDTIME. 90 tablet 1  . fluticasone (FLONASE) 50 MCG/ACT nasal spray Place 2 sprays into both nostrils daily. 16 g 6  . gabapentin (NEURONTIN) 100 MG capsule TAKE 1 CAPSULE BY MOUTH 3 TIMES DAILY AS NEEDED. (Patient taking differently: Take 100 mg by mouth 2 (two) times daily. ) 270 capsule 1  . ipratropium (ATROVENT) 0.06 % nasal spray Place 2 sprays into both nostrils 2 (two) times daily.    Marland Kitchen KLOR-CON 10 10 MEQ tablet TAKE 1 TABLET BY MOUTH 2 TIMES DAILY. 60 tablet 6  . nitroGLYCERIN (NITROSTAT) 0.4 MG SL tablet Place 0.4 mg under the tongue every 5 (five) minutes as needed for chest pain.    . polyethylene glycol (MIRALAX / GLYCOLAX) packet Take 17 g by mouth every other day.     . traMADol (ULTRAM) 50 MG tablet TAKE 1  TABLET BY MOUTH EVERY 6 HOURS AS NEEDED FOR PAIN 60 tablet 0  . traZODone (DESYREL) 50 MG tablet Take 0.5-1 tablets (25-50 mg total) by mouth at bedtime as needed for sleep. (Patient taking differently: Take 25 mg by mouth at bedtime as needed for sleep. ) 30 tablet 3   No current facility-administered medications for this visit.     Past Medical History  Diagnosis Date  . 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 Hyperlipidemia   . Hypertension   . Hemidiaphragm     elevated hemidiaphragm  . Colon polyps   . Osteoarthritis cervical spine   . Hearing loss   . Allergic rhinitis   . Carotid artery disease ([place])     Doppler, October, 2011, 0-39% bilateral  . Colon polyp   . Hx of CABG     2004  . Ejection fraction     normal, catheterization, 2009  . Bradycardia     January, 2013    Past Surgical History  Procedure Laterality Date  . Total abdominal hysterectomy    . Hiatal hernia repair    . Coronary artery bypass graft      ACS  . Cholecystectomy    . Breast lumpectomy  multiple, all benign  . Colonoscopy w/ polypectomy      Social History   Social History  . Marital Status: Married    Spouse Name: N/A  . Number of Children: N/A  . Years of Education: N/A   Occupational History  . Not on file.   Social History Main Topics  . Smoking status: Former Smoker    Types: Cigarettes  . Smokeless tobacco: Not on file  . Alcohol Use: No  . Drug Use: No  . Sexual Activity: Not on file   Other Topics Concern  . Not on file   Social History Narrative   Widowed 2000.    Family History  Problem Relation Age of Onset  . Heart attack Father   . Stroke Father   . Dementia Mother   . Breast cancer Mother     ROS: no fevers or chills, productive cough, hemoptysis, dysphasia, odynophagia, melena, hematochezia, dysuria, hematuria, rash, seizure activity,  orthopnea, PND, pedal edema, claudication. Remaining systems are negative.  Physical Exam: Well-developed well-nourished in no acute distress.  Skin is warm and dry.  HEENT is normal.  Neck is supple.  Chest is clear to auscultation with normal expansion.  Cardiovascular exam is regular rate and rhythm.  Abdominal exam nontender or distended. No masses palpated. Extremities show no edema. neuro grossly intact  ECG Sinus rhythm, nonspecific ST changes, prolonged QT interval.

## 2015-09-10 ENCOUNTER — Encounter: Payer: Self-pay | Admitting: Cardiology

## 2015-09-10 ENCOUNTER — Ambulatory Visit (INDEPENDENT_AMBULATORY_CARE_PROVIDER_SITE_OTHER): Payer: Commercial Managed Care - HMO | Admitting: Cardiology

## 2015-09-10 VITALS — BP 112/60 | HR 60 | Ht 63.0 in | Wt 150.0 lb

## 2015-09-10 DIAGNOSIS — E785 Hyperlipidemia, unspecified: Secondary | ICD-10-CM | POA: Diagnosis not present

## 2015-09-10 DIAGNOSIS — I10 Essential (primary) hypertension: Secondary | ICD-10-CM

## 2015-09-10 DIAGNOSIS — I2510 Atherosclerotic heart disease of native coronary artery without angina pectoris: Secondary | ICD-10-CM | POA: Diagnosis not present

## 2015-09-10 DIAGNOSIS — I2583 Coronary atherosclerosis due to lipid rich plaque: Principal | ICD-10-CM

## 2015-09-10 MED ORDER — NITROGLYCERIN 0.4 MG SL SUBL: 0.4000 mg | SUBLINGUAL_TABLET | SUBLINGUAL | Status: DC | PRN

## 2015-09-10 NOTE — Assessment & Plan Note (Signed)
Patient's blood pressure is controlled. There was a question of orthostasis previously. If she develops worsening symptoms we can discontinue diuretic in the future.

## 2015-09-10 NOTE — Patient Instructions (Signed)
Your physician wants you to follow-up in: ONE YEAR WITH DR CRENSHAW You will receive a reminder letter in the mail two months in advance. If you don't receive a letter, please call our office to schedule the follow-up appointment.   If you need a refill on your cardiac medications before your next appointment, please call your pharmacy.  

## 2015-09-10 NOTE — Assessment & Plan Note (Addendum)
Continue aspirin and statin. Note approximately 20 minutes spent prior to patient visit reviewing previous records.

## 2015-09-10 NOTE — Assessment & Plan Note (Signed)
Continue statin. 

## 2015-09-10 NOTE — Assessment & Plan Note (Signed)
Continue aspirin and statin. 

## 2015-09-12 ENCOUNTER — Other Ambulatory Visit: Payer: Self-pay | Admitting: Family Medicine

## 2015-09-13 ENCOUNTER — Telehealth: Payer: Self-pay | Admitting: Family Medicine

## 2015-09-13 NOTE — Telephone Encounter (Signed)
Pt was referred to ENT in October.  Dr Redmond Baseman at [facility].  They can call and make an appt unless their insurance requires a new referral

## 2015-09-13 NOTE — Telephone Encounter (Signed)
Caller name: Hilda Blades   Relationship to patient: daughter   Can be reached: [phone]  Reason for call: She is requesting a referral to Ear , nose and Throat she says that her mom has a sore in her nose.

## 2015-09-13 NOTE — Telephone Encounter (Signed)
Last OV 08-13-15 Tramadol last filled 08/30/15 #60 with 0

## 2015-09-14 DIAGNOSIS — M19012 Primary osteoarthritis, left shoulder: Secondary | ICD-10-CM | POA: Diagnosis not present

## 2015-09-14 NOTE — Telephone Encounter (Signed)
Left message for Wanda Wright regarding Dr. Rande Lawman' instructions for referral. Advise to call back if insures required patient to have new one.

## 2015-09-23 DIAGNOSIS — J340 Abscess, furuncle and carbuncle of nose: Secondary | ICD-10-CM | POA: Diagnosis not present

## 2015-09-23 DIAGNOSIS — J300 Vasomotor rhinitis: Secondary | ICD-10-CM | POA: Diagnosis not present

## 2015-09-26 ENCOUNTER — Other Ambulatory Visit: Payer: Self-pay | Admitting: Family Medicine

## 2015-09-27 NOTE — Telephone Encounter (Signed)
Medication filled to pharmacy as requested.   

## 2015-10-01 DIAGNOSIS — H52203 Unspecified astigmatism, bilateral: Secondary | ICD-10-CM | POA: Diagnosis not present

## 2015-10-01 DIAGNOSIS — Z961 Presence of intraocular lens: Secondary | ICD-10-CM | POA: Diagnosis not present

## 2015-10-01 DIAGNOSIS — H353132 Nonexudative age-related macular degeneration, bilateral, intermediate dry stage: Secondary | ICD-10-CM | POA: Diagnosis not present

## 2015-10-01 DIAGNOSIS — H35372 Puckering of macula, left eye: Secondary | ICD-10-CM | POA: Diagnosis not present

## 2015-10-03 ENCOUNTER — Other Ambulatory Visit: Payer: Self-pay | Admitting: Family Medicine

## 2015-10-04 NOTE — Telephone Encounter (Signed)
Last OV 08/13/15 Tramadol last filled 08/30/15 #60 with 0

## 2015-10-04 NOTE — Telephone Encounter (Signed)
Medication filled to pharmacy as requested.   

## 2015-10-21 DIAGNOSIS — H35372 Puckering of macula, left eye: Secondary | ICD-10-CM | POA: Diagnosis not present

## 2015-10-21 DIAGNOSIS — H353122 Nonexudative age-related macular degeneration, left eye, intermediate dry stage: Secondary | ICD-10-CM | POA: Diagnosis not present

## 2015-10-21 DIAGNOSIS — H43813 Vitreous degeneration, bilateral: Secondary | ICD-10-CM | POA: Diagnosis not present

## 2015-10-21 DIAGNOSIS — H353112 Nonexudative age-related macular degeneration, right eye, intermediate dry stage: Secondary | ICD-10-CM | POA: Diagnosis not present

## 2015-10-31 ENCOUNTER — Other Ambulatory Visit: Payer: Self-pay | Admitting: Family Medicine

## 2015-11-01 NOTE — Telephone Encounter (Signed)
Last OV 08/13/15 Tramadol last filled 10/04/15 #60 with 0

## 2015-11-01 NOTE — Telephone Encounter (Signed)
Medication filled to pharmacy as requested.   

## 2015-11-10 ENCOUNTER — Other Ambulatory Visit: Payer: Self-pay | Admitting: General Practice

## 2015-11-10 MED ORDER — GABAPENTIN 100 MG PO CAPS: ORAL_CAPSULE | ORAL | Status: DC

## 2015-11-15 ENCOUNTER — Emergency Department (HOSPITAL_BASED_OUTPATIENT_CLINIC_OR_DEPARTMENT_OTHER)
Admission: EM | Admit: 2015-11-15 | Discharge: 2015-11-16 | Disposition: A | Discharge status: Home / Self Care | Payer: Commercial Managed Care - HMO | Attending: Emergency Medicine | Admitting: Emergency Medicine

## 2015-11-15 ENCOUNTER — Emergency Department (HOSPITAL_BASED_OUTPATIENT_CLINIC_OR_DEPARTMENT_OTHER): Payer: Commercial Managed Care - HMO

## 2015-11-15 ENCOUNTER — Encounter (HOSPITAL_BASED_OUTPATIENT_CLINIC_OR_DEPARTMENT_OTHER): Payer: Self-pay | Admitting: *Deleted

## 2015-11-15 DIAGNOSIS — Z79899 Other long term (current) drug therapy: Secondary | ICD-10-CM | POA: Diagnosis not present

## 2015-11-15 DIAGNOSIS — R109 Unspecified abdominal pain: Secondary | ICD-10-CM | POA: Diagnosis not present

## 2015-11-15 DIAGNOSIS — Z9049 Acquired absence of other specified parts of digestive tract: Secondary | ICD-10-CM | POA: Diagnosis not present

## 2015-11-15 DIAGNOSIS — K5900 Constipation, unspecified: Secondary | ICD-10-CM | POA: Diagnosis not present

## 2015-11-15 DIAGNOSIS — I10 Essential (primary) hypertension: Secondary | ICD-10-CM | POA: Diagnosis not present

## 2015-11-15 DIAGNOSIS — I2510 Atherosclerotic heart disease of native coronary artery without angina pectoris: Secondary | ICD-10-CM | POA: Insufficient documentation

## 2015-11-15 DIAGNOSIS — F0390 Unspecified dementia without behavioral disturbance: Secondary | ICD-10-CM | POA: Diagnosis not present

## 2015-11-15 DIAGNOSIS — R1031 Right lower quadrant pain: Secondary | ICD-10-CM | POA: Diagnosis present

## 2015-11-15 DIAGNOSIS — Z90710 Acquired absence of both cervix and uterus: Secondary | ICD-10-CM | POA: Diagnosis not present

## 2015-11-15 DIAGNOSIS — Z951 Presence of aortocoronary bypass graft: Secondary | ICD-10-CM | POA: Insufficient documentation

## 2015-11-15 DIAGNOSIS — E785 Hyperlipidemia, unspecified: Secondary | ICD-10-CM | POA: Insufficient documentation

## 2015-11-15 DIAGNOSIS — K5792 Diverticulitis of intestine, part unspecified, without perforation or abscess without bleeding: Secondary | ICD-10-CM | POA: Diagnosis not present

## 2015-11-15 DIAGNOSIS — K449 Diaphragmatic hernia without obstruction or gangrene: Secondary | ICD-10-CM | POA: Insufficient documentation

## 2015-11-15 DIAGNOSIS — K5790 Diverticulosis of intestine, part unspecified, without perforation or abscess without bleeding: Secondary | ICD-10-CM

## 2015-11-15 DIAGNOSIS — Z87891 Personal history of nicotine dependence: Secondary | ICD-10-CM | POA: Insufficient documentation

## 2015-11-15 LAB — COMPREHENSIVE METABOLIC PANEL
ALBUMIN: 3.8 g/dL (ref 3.5–5.0)
ALT: 12 U/L — ABNORMAL LOW (ref 14–54)
ANION GAP: 8 (ref 5–15)
AST: 19 U/L (ref 15–41)
Alkaline Phosphatase: 86 U/L (ref 38–126)
BILIRUBIN TOTAL: 0.6 mg/dL (ref 0.3–1.2)
BUN: 22 mg/dL — ABNORMAL HIGH (ref 6–20)
CHLORIDE: 105 mmol/L (ref 101–111)
CO2: 27 mmol/L (ref 22–32)
Calcium: 9.2 mg/dL (ref 8.9–10.3)
Creatinine, Ser: 1.18 mg/dL — ABNORMAL HIGH (ref 0.44–1.00)
GFR calc Af Amer: 48 mL/min — ABNORMAL LOW (ref >60)
GFR calc non Af Amer: 41 mL/min — ABNORMAL LOW (ref >60)
GLUCOSE: 101 mg/dL — AB (ref 65–99)
POTASSIUM: 3.7 mmol/L (ref 3.5–5.1)
SODIUM: 140 mmol/L (ref 135–145)
TOTAL PROTEIN: 6.5 g/dL (ref 6.5–8.1)

## 2015-11-15 LAB — URINALYSIS, ROUTINE W REFLEX MICROSCOPIC
BILIRUBIN URINE: NEGATIVE
Glucose, UA: NEGATIVE mg/dL
HGB URINE DIPSTICK: NEGATIVE
KETONES UR: NEGATIVE mg/dL
NITRITE: NEGATIVE
PH: 6.5 (ref 5.0–8.0)
Protein, ur: NEGATIVE mg/dL
Specific Gravity, Urine: 1.018 (ref 1.005–1.030)

## 2015-11-15 LAB — CBC WITH DIFFERENTIAL/PLATELET
BASOS PCT: 1 %
Basophils Absolute: 0.1 10*3/uL (ref 0.0–0.1)
Eosinophils Absolute: 0.2 10*3/uL (ref 0.0–0.7)
Eosinophils Relative: 5 %
HEMATOCRIT: 35.8 % — AB (ref 36.0–46.0)
HEMOGLOBIN: 11.7 g/dL — AB (ref 12.0–15.0)
LYMPHS PCT: 28 %
Lymphs Abs: 1.4 10*3/uL (ref 0.7–4.0)
MCH: 30.4 pg (ref 26.0–34.0)
MCHC: 32.7 g/dL (ref 30.0–36.0)
MCV: 93 fL (ref 78.0–100.0)
MONOS PCT: 11 %
Monocytes Absolute: 0.5 10*3/uL (ref 0.1–1.0)
NEUTROS ABS: 2.7 10*3/uL (ref 1.7–7.7)
NEUTROS PCT: 55 %
Platelets: 174 10*3/uL (ref 150–400)
RBC: 3.85 MIL/uL — ABNORMAL LOW (ref 3.87–5.11)
RDW: 13 % (ref 11.5–15.5)
WBC: 4.9 10*3/uL (ref 4.0–10.5)

## 2015-11-15 LAB — URINE MICROSCOPIC-ADD ON
RBC / HPF: NONE SEEN RBC/hpf (ref 0–5)
WBC UA: NONE SEEN WBC/hpf (ref 0–5)

## 2015-11-15 LAB — TROPONIN I: Troponin I: <0.03 ng/mL (ref <0.031)

## 2015-11-15 LAB — LIPASE, BLOOD: Lipase: 38 U/L (ref 11–51)

## 2015-11-15 MED ORDER — SODIUM CHLORIDE 0.9 % IV SOLN
INTRAVENOUS | Status: DC
  Administered 2015-11-16: 01:00:00 via INTRAVENOUS

## 2015-11-15 MED ORDER — SODIUM CHLORIDE 0.9 % IV BOLUS (SEPSIS)
1000.0000 mL | Freq: Once | INTRAVENOUS | Status: AC
  Administered 2015-11-15: 1000 mL via INTRAVENOUS

## 2015-11-15 NOTE — ED Notes (Signed)
Ambulatory to BR without difficulty. Returned to Biomedical scientist. C/O RUQ pain. Abd soft. Tender RUQ. BS x 4. Daughter at bedside.

## 2015-11-15 NOTE — ED Notes (Signed)
Abdominal pain. Right upper quadrant pain this afternoon.

## 2015-11-15 NOTE — ED Provider Notes (Signed)
CSN: [id number]     Arrival date & time 11/15/15  2010 History  By signing my name below, I, Doran Stabler, attest that this documentation has been prepared under the direction and in the presence of Dorie Rank, MD. Electronically Signed: Doran Stabler, ED Scribe. 11/15/2015. 9:51 PM.   Chief Complaint  Patient presents with  . Abdominal Pain   Level 5 caveat due to dementia.   The history is provided by a relative. No language interpreter was used.   HPI Comments: Wanda Wright is a 82 y.o. female who presents to the Emergency Department complaining of RLQ abdominal pain that began today. Daughter describes the pt's pain as a " grabbing pain" as reported to her by the pt. She states the pt began having upper abdominal pain 3 days ago which seemed to be relieved since the pt was not complaining of any pain until today. However tioday, the pt's pain is in her RLQ rather than her upper abdomen. Daughter denies any fevers, chills, cough, CP, nausea, vomiting, diarrhea, urinary symptoms or any other symptoms at this time.   Past Medical History  Diagnosis Date  . 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 Hyperlipidemia   . Hypertension   . Hemidiaphragm     elevated hemidiaphragm  . Colon polyps   . Osteoarthritis cervical spine   . Hearing loss   . Allergic rhinitis   . Carotid artery disease ([place])     Doppler, October, 2011, 0-39% bilateral  . Colon polyp   . Hx of CABG     2004  . Ejection fraction     normal, catheterization, 2009  . Bradycardia     January, 2013   Past Surgical History  Procedure Laterality Date  . Total abdominal hysterectomy    . Hiatal hernia repair    . Coronary artery bypass graft      ACS  . Cholecystectomy    . Breast lumpectomy      multiple, all benign  . Colonoscopy w/ polypectomy     Family History  Problem Relation Age of Onset  .  Heart attack Father   . Stroke Father   . Dementia Mother   . Breast cancer Mother    Social History  Substance Use Topics  . Smoking status: Former Smoker    Types: Cigarettes  . Smokeless tobacco: None  . Alcohol Use: No   OB History    No data available     Review of Systems  Unable to perform ROS: Dementia   A complete 10 system review of systems was obtained and all systems are negative except as noted in the HPI and PMH.   Allergies  Codeine; Horse-derived products; and Tetanus toxoid  Home Medications   Prior to Admission medications   Medication Sig Start Date End Date Taking? Authorizing Provider  acetaminophen (TYLENOL) 500 MG tablet Take 1,000 mg by mouth 2 (two) times daily.     Historical Provider, MD  aspirin 81 MG tablet Take 81 mg by mouth daily.      Historical Provider, MD  atorvastatin (LIPITOR) 20 MG tablet TAKE 1 TABLET BY MOUTH AS DIRECTED 09/27/15   Midge Minium, MD  Calcium Carbonate-Vitamin D 600-400 MG-UNIT per tablet Take 2 tablets by mouth daily.    Historical Provider, MD  chlorthalidone (HYGROTON) 25 MG tablet TAKE AS  DIRECTED 08/30/15   Midge Minium, MD  cholecalciferol (VITAMIN D) 1000 UNITS tablet Take 1,000 Units by mouth daily.    Historical Provider, MD  donepezil (ARICEPT) 10 MG tablet TAKE 1 TABLET (10 MG TOTAL) BY MOUTH AT BEDTIME. 05/11/15   Midge Minium, MD  fluticasone (FLONASE) 50 MCG/ACT nasal spray Place 2 sprays into both nostrils daily. 05/25/14   Midge Minium, MD  gabapentin (NEURONTIN) 100 MG capsule TAKE 1 CAPSULE BY MOUTH 3 TIMES DAILY AS NEEDED. 11/10/15   Midge Minium, MD  ipratropium (ATROVENT) 0.06 % nasal spray Place 2 sprays into both nostrils 2 (two) times daily.    Historical Provider, MD  KLOR-CON 10 10 MEQ tablet TAKE 1 TABLET BY MOUTH 2 TIMES DAILY. 07/20/15   Midge Minium, MD  nitroGLYCERIN (NITROSTAT) 0.4 MG SL tablet Place 1 tablet (0.4 mg total) under the tongue every 5 (five)  minutes as needed for chest pain. 09/10/15   Lelon Perla, MD  polyethylene glycol (MIRALAX / GLYCOLAX) packet Take 17 g by mouth every other day.     Historical Provider, MD  traMADol (ULTRAM) 50 MG tablet TAKE 1 TABLET BY MOUTH EVERY 6 HOURS AS NEEDED FOR PAIN 11/01/15   Midge Minium, MD  traZODone (DESYREL) 50 MG tablet Take 0.5-1 tablets (25-50 mg total) by mouth at bedtime as needed for sleep. Patient taking differently: Take 25 mg by mouth at bedtime as needed for sleep.  10/14/14   Midge Minium, MD   BP 158/68 mmHg  Pulse 54  Temp(Src) 97.9 F (36.6 C) (Oral)  Resp 15  Ht 5' 3.5" (1.613 m)  Wt 68.04 kg  BMI 26.15 kg/m2  SpO2 100%   Physical Exam  Constitutional: She appears well-nourished. No distress.  Elderly  HENT:  Head: Normocephalic and atraumatic.  Right Ear: External ear normal.  Left Ear: External ear normal.  Eyes: Conjunctivae are normal. Right eye exhibits no discharge. Left eye exhibits no discharge. No scleral icterus.  Neck: Neck supple. No tracheal deviation present.  Cardiovascular: Normal rate, regular rhythm and intact distal pulses.   Pulmonary/Chest: Effort normal and breath sounds normal. No stridor. No respiratory distress. She has no wheezes. She has no rales.  Abdominal: Soft. Bowel sounds are normal. She exhibits no distension, no pulsatile midline mass and no mass. There is no tenderness. There is no rigidity, no rebound and no guarding. No hernia.  Musculoskeletal: She exhibits no edema or tenderness.  Neurological: She is alert. She has normal strength. No cranial nerve deficit (no facial droop, extraocular movements intact, no slurred speech) or sensory deficit. She exhibits normal muscle tone. She displays no seizure activity. Coordination normal.  Skin: Skin is warm and dry. No rash noted.  Psychiatric: She has a normal mood and affect.  Nursing note and vitals reviewed.   ED Course  Procedures   DIAGNOSTIC STUDIES: Oxygen  Saturation is 99% on room air, normal by my interpretation.    COORDINATION OF CARE: 9:46 PM Will give fluids. Will order abdomen x-ray, blood work, urinalysis, and EKG. Discussed treatment plan with pt and daughter at bedside and they agreed to plan.  Labs Review Labs Reviewed  URINALYSIS, ROUTINE W REFLEX MICROSCOPIC (NOT AT [facility]) - Abnormal; Notable for the following:    Leukocytes, UA SMALL (*)    All other components within normal limits  URINE MICROSCOPIC-ADD ON - Abnormal; Notable for the following:    Squamous Epithelial / LPF 0-5 (*)  Bacteria, UA FEW (*)    All other components within normal limits  COMPREHENSIVE METABOLIC PANEL - Abnormal; Notable for the following:    Glucose, Bld 101 (*)    BUN 22 (*)    Creatinine, Ser 1.18 (*)    ALT 12 (*)    GFR calc non Af Amer 41 (*)    GFR calc Af Amer 48 (*)    All other components within normal limits  CBC WITH DIFFERENTIAL/PLATELET - Abnormal; Notable for the following:    RBC 3.85 (*)    Hemoglobin 11.7 (*)    HCT 35.8 (*)    All other components within normal limits  LIPASE, BLOOD  TROPONIN I    Imaging Review Dg Abd Acute W/chest  11/15/2015  CLINICAL DATA:  Acute onset of right-sided abdominal pain. Initial encounter. EXAM: DG ABDOMEN ACUTE W/ 1V CHEST COMPARISON:  Abdominal radiograph performed 10/17/2013 FINDINGS: The lungs are well-aerated and clear. There is no evidence of focal opacification, pleural effusion or pneumothorax. The patient is status post median sternotomy. The cardiomediastinal silhouette remains normal in size. The visualized bowel gas pattern is unremarkable. Scattered stool and air are seen within the colon; there is no evidence of small bowel dilatation to suggest obstruction. No free intra-abdominal air is identified on the provided upright view. Clips are noted within the right upper quadrant, reflecting prior cholecystectomy. No acute osseous abnormalities are seen; the sacroiliac joints are  unremarkable in appearance. IMPRESSION: 1. Unremarkable bowel gas pattern; no free intra-abdominal air seen. Moderate amount of stool noted in the colon. 2. No acute cardiopulmonary process seen. Electronically Signed   By: Garald Balding M.D.   On: 11/15/2015 22:27   I have personally reviewed and evaluated these images and lab results as part of my medical decision-making.   EKG Interpretation   Date/Time:  Monday November 15 2015 22:30:16 EDT Ventricular Rate:  54 PR Interval:  164 QRS Duration: 103 QT Interval:  520 QTC Calculation: 493 R Axis:   54 Text Interpretation:  Sinus rhythm Abnormal R-wave progression, early  transition Borderline prolonged QT interval No significant change since  last tracing Confirmed by Purl Claytor  MD-J, Eila Runyan ([id number]) on 11/15/2015 11:49:45  PM      MDM  1155 pm.  Reviewed findings with patient.  Tests are reassuring however pt states she has been having pain while she has been here.  Considering her age, will ct abdomen to evaluate further.    I personally performed the services described in this documentation, which was scribed in my presence.  The recorded information has been reviewed and is accurate.    Dorie Rank, MD 11/15/15 [phone]

## 2015-11-16 ENCOUNTER — Emergency Department (HOSPITAL_BASED_OUTPATIENT_CLINIC_OR_DEPARTMENT_OTHER): Payer: Commercial Managed Care - HMO

## 2015-11-16 DIAGNOSIS — K5900 Constipation, unspecified: Secondary | ICD-10-CM | POA: Diagnosis not present

## 2015-11-16 DIAGNOSIS — M19012 Primary osteoarthritis, left shoulder: Secondary | ICD-10-CM | POA: Diagnosis not present

## 2015-11-16 MED ORDER — IOPAMIDOL (ISOVUE-300) INJECTION 61%
80.0000 mL | Freq: Once | INTRAVENOUS | Status: AC | PRN
  Administered 2015-11-16: 80 mL via INTRAVENOUS

## 2015-11-16 MED ORDER — DOCUSATE SODIUM 100 MG PO CAPS: 100.0000 mg | ORAL_CAPSULE | Freq: Two times a day (BID) | ORAL | Status: DC

## 2015-11-16 NOTE — ED Notes (Signed)
Pt and daughter given d/c instructions. Verbalizes understanding. No questions.

## 2015-11-16 NOTE — ED Notes (Signed)
Patient transported to CT 

## 2015-11-16 NOTE — ED Provider Notes (Addendum)
12:15 AM  Assumed care from Dr. Tomi Bamberger.  Pt is a 82 y.o. female with history of dementia, CAD, hypertension, hyperlipidemia who presents emergency department with abdominal pain. Labs, urine unremarkable. X-ray shows no acute abnormality. CT scan pending for further disposition.  2:25 AM  Pt reports feeling better. She did have mild elevation of her creatinine at 1.18 but has received a liter of IV fluids in the emergency department. CT scan shows a moderate hiatal hernia which her daughter was aware of. She has diverticulosis without diverticulitis. She is status post cholecystectomy and hysterectomy. No inflammatory changes in the abdomen and no adenopathy. She does have constipation with no fecal impaction seen.   She is on tramadol chronically for arthritic pain. Discussed with her daughter that this could be contributing to her constipation. They state that this pain is been a problem for them for several years. She is having bowel movements and passing gas. There is no bowel obstruction seen on CT imaging. They've been trying MiraLAX at home but daughter is having a hard time getting patient to take this. Have advised her that she should be taking MiraLAX daily, Colace 100 mg twice a day and increase her fiber and water intake. Discuss with them if they cannot get her to eat more fiber that they could use Benefiber or Metamucil over-the-counter. I feel she is safe to be discharged home. They have a PCP for follow-up in our rehabilitation an appointment scheduled. Discussed return precautions. They verbalize understanding and are comfortable with this plan.  [place], DO 11/16/15 0232   2:35 AM  D/w Dr. Alroy Dust with radiology.  He has reviewed patient's CT again.  No acute appendicitis seen.  [place], DO 11/16/15 [phone]

## 2015-11-16 NOTE — ED Notes (Signed)
Drinking contrast.

## 2015-11-16 NOTE — Discharge Instructions (Signed)
I recommend you use Metamucil or Benefiber to increase your fiber intake once a day. I also recommend you use MiraLAX once a day to help with bowel movements. You may also use Colace 100 mg twice a day. Please increase your water and fiber intake at home. Tramadol may be contributing to your symptoms.  Constipation, Adult Constipation is when a person has fewer than three bowel movements a week, has difficulty having a bowel movement, or has stools that are dry, hard, or larger than normal. As people grow older, constipation is more common. A low-fiber diet, not taking in enough fluids, and taking certain medicines may make constipation worse.  CAUSES   Certain medicines, such as antidepressants, pain medicine, iron supplements, antacids, and water pills.   Certain diseases, such as diabetes, irritable bowel syndrome (IBS), thyroid disease, or depression.   Not drinking enough water.   Not eating enough fiber-rich foods.   Stress or travel.   Lack of physical activity or exercise.   Ignoring the urge to have a bowel movement.   Using laxatives too much.  SIGNS AND SYMPTOMS   Having fewer than three bowel movements a week.   Straining to have a bowel movement.   Having stools that are hard, dry, or larger than normal.   Feeling full or bloated.   Pain in the lower abdomen.   Not feeling relief after having a bowel movement.  DIAGNOSIS  Your health care provider will take a medical history and perform a physical exam. Further testing may be done for severe constipation. Some tests may include:  A barium enema X-ray to examine your rectum, colon, and, sometimes, your small intestine.   A sigmoidoscopy to examine your lower colon.   A colonoscopy to examine your entire colon. TREATMENT  Treatment will depend on the severity of your constipation and what is causing it. Some dietary treatments include drinking more fluids and eating more fiber-rich foods.  Lifestyle treatments may include regular exercise. If these diet and lifestyle recommendations do not help, your health care provider may recommend taking over-the-counter laxative medicines to help you have bowel movements. Prescription medicines may be prescribed if over-the-counter medicines do not work.  HOME CARE INSTRUCTIONS   Eat foods that have a lot of fiber, such as fruits, vegetables, whole grains, and beans.  Limit foods high in fat and processed sugars, such as french fries, hamburgers, cookies, candies, and soda.   A fiber supplement may be added to your diet if you cannot get enough fiber from foods.   Drink enough fluids to keep your urine clear or pale yellow.   Exercise regularly or as directed by your health care provider.   Go to the restroom when you have the urge to go. Do not hold it.   Only take over-the-counter or prescription medicines as directed by your health care provider. Do not take other medicines for constipation without talking to your health care provider first.  [place] IF:   You have bright red blood in your stool.   Your constipation lasts for more than 4 days or gets worse.   You have abdominal or rectal pain.   You have thin, pencil-like stools.   You have unexplained weight loss. MAKE SURE YOU:   Understand these instructions.  Will watch your condition.  Will get help right away if you are not doing well or get worse.   This information is not intended to replace advice given to you by  your health care provider. Make sure you discuss any questions you have with your health care provider.   Document Released: 04/21/2004 Document Revised: 08/14/2014 Document Reviewed: 05/05/2013 Elsevier Interactive Patient Education 2016 Elsevier Inc.  High-Fiber Diet Fiber, also called dietary fiber, is a type of carbohydrate found in fruits, vegetables, whole grains, and beans. A high-fiber diet can have many health  benefits. Your health care provider may recommend a high-fiber diet to help:  Prevent constipation. Fiber can make your bowel movements more regular.  Lower your cholesterol.  Relieve hemorrhoids, uncomplicated diverticulosis, or irritable bowel syndrome.  Prevent overeating as part of a weight-loss plan.  Prevent heart disease, type 2 diabetes, and certain cancers. WHAT IS MY PLAN? The recommended daily intake of fiber includes:  38 grams for men under age 12.  68 grams for men over age 37.  30 grams for women under age 41.  73 grams for women over age 54. You can get the recommended daily intake of dietary fiber by eating a variety of fruits, vegetables, grains, and beans. Your health care provider may also recommend a fiber supplement if it is not possible to get enough fiber through your diet. WHAT DO I NEED TO KNOW ABOUT A HIGH-FIBER DIET?  Fiber supplements have not been widely studied for their effectiveness, so it is better to get fiber through food sources.  Always check the fiber content on thenutrition facts label of any prepackaged food. Look for foods that contain at least 5 grams of fiber per serving.  Ask your dietitian if you have questions about specific foods that are related to your condition, especially if those foods are not listed in the following section.  Increase your daily fiber consumption gradually. Increasing your intake of dietary fiber too quickly may cause bloating, cramping, or gas.  Drink plenty of water. Water helps you to digest fiber. WHAT FOODS CAN I EAT? Grains Whole-grain breads. Multigrain cereal. Oats and oatmeal. Brown rice. Barley. Bulgur wheat. Welcome. Bran muffins. Popcorn. Rye wafer crackers. Vegetables Sweet potatoes. Spinach. Kale. Artichokes. Cabbage. Broccoli. Green peas. Carrots. Squash. Fruits Berries. Pears. Apples. Oranges. Avocados. Prunes and raisins. Dried figs. Meats and Other Protein Sources Navy, kidney, pinto, and  soy beans. Split peas. Lentils. Nuts and seeds. Dairy Fiber-fortified yogurt. Beverages Fiber-fortified soy milk. Fiber-fortified orange juice. Other Fiber bars. The items listed above may not be a complete list of recommended foods or beverages. Contact your dietitian for more options. WHAT FOODS ARE NOT RECOMMENDED? Grains White bread. Pasta made with refined flour. White rice. Vegetables Fried potatoes. Canned vegetables. Well-cooked vegetables.  Fruits Fruit juice. Cooked, strained fruit. Meats and Other Protein Sources Fatty cuts of meat. Fried Sales executive or fried fish. Dairy Milk. Yogurt. Cream cheese. Sour cream. Beverages Soft drinks. Other Cakes and pastries. Butter and oils. The items listed above may not be a complete list of foods and beverages to avoid. Contact your dietitian for more information. WHAT ARE SOME TIPS FOR INCLUDING HIGH-FIBER FOODS IN MY DIET?  Eat a wide variety of high-fiber foods.  Make sure that half of all grains consumed each day are whole grains.  Replace breads and cereals made from refined flour or white flour with whole-grain breads and cereals.  Replace white rice with brown rice, bulgur wheat, or millet.  Start the day with a breakfast that is high in fiber, such as a cereal that contains at least 5 grams of fiber per serving.  Use beans in place of meat in soups,  salads, or pasta.  Eat high-fiber snacks, such as berries, raw vegetables, nuts, or popcorn.   This information is not intended to replace advice given to you by your health care provider. Make sure you discuss any questions you have with your health care provider.   Document Released: 07/24/2005 Document Revised: 08/14/2014 Document Reviewed: 01/06/2014 Elsevier Interactive Patient Education [employer].

## 2015-11-25 ENCOUNTER — Encounter: Payer: Self-pay | Admitting: Family Medicine

## 2015-11-25 ENCOUNTER — Ambulatory Visit (INDEPENDENT_AMBULATORY_CARE_PROVIDER_SITE_OTHER): Payer: Commercial Managed Care - HMO | Admitting: Family Medicine

## 2015-11-25 VITALS — BP 134/82 | HR 52 | Temp 98.0°F | Resp 17 | Ht 64.0 in | Wt 150.2 lb

## 2015-11-25 DIAGNOSIS — I10 Essential (primary) hypertension: Secondary | ICD-10-CM | POA: Diagnosis not present

## 2015-11-25 DIAGNOSIS — E785 Hyperlipidemia, unspecified: Secondary | ICD-10-CM | POA: Diagnosis not present

## 2015-11-25 DIAGNOSIS — N644 Mastodynia: Secondary | ICD-10-CM | POA: Diagnosis not present

## 2015-11-25 LAB — HEPATIC FUNCTION PANEL
ALT: 11 U/L (ref 0–35)
AST: 17 U/L (ref 0–37)
Albumin: 4.1 g/dL (ref 3.5–5.2)
Alkaline Phosphatase: 95 U/L (ref 39–117)
BILIRUBIN DIRECT: 0.1 mg/dL (ref 0.0–0.3)
TOTAL PROTEIN: 7 g/dL (ref 6.0–8.3)
Total Bilirubin: 0.5 mg/dL (ref 0.2–1.2)

## 2015-11-25 LAB — CBC WITH DIFFERENTIAL/PLATELET
BASOS ABS: 0 10*3/uL (ref 0.0–0.1)
Basophils Relative: 0.6 % (ref 0.0–3.0)
EOS ABS: 0.2 10*3/uL (ref 0.0–0.7)
Eosinophils Relative: 5.1 % — ABNORMAL HIGH (ref 0.0–5.0)
HEMATOCRIT: 40.5 % (ref 36.0–46.0)
Hemoglobin: 13.4 g/dL (ref 12.0–15.0)
LYMPHS ABS: 1.2 10*3/uL (ref 0.7–4.0)
LYMPHS PCT: 27.9 % (ref 12.0–46.0)
MCHC: 33.1 g/dL (ref 30.0–36.0)
MCV: 90.7 fl (ref 78.0–100.0)
Monocytes Absolute: 0.3 10*3/uL (ref 0.1–1.0)
Monocytes Relative: 6.9 % (ref 3.0–12.0)
NEUTROS ABS: 2.7 10*3/uL (ref 1.4–7.7)
NEUTROS PCT: 59.5 % (ref 43.0–77.0)
PLATELETS: 223 10*3/uL (ref 150.0–400.0)
RBC: 4.47 Mil/uL (ref 3.87–5.11)
RDW: 14.4 % (ref 11.5–15.5)
WBC: 4.5 10*3/uL (ref 4.0–10.5)

## 2015-11-25 LAB — BASIC METABOLIC PANEL
BUN: 15 mg/dL (ref 6–23)
CHLORIDE: 103 meq/L (ref 96–112)
CO2: 32 meq/L (ref 19–32)
CREATININE: 1.1 mg/dL (ref 0.40–1.20)
Calcium: 9.9 mg/dL (ref 8.4–10.5)
GFR: 50.21 mL/min — ABNORMAL LOW (ref >60.00)
GLUCOSE: 86 mg/dL (ref 70–99)
Potassium: 3.5 mEq/L (ref 3.5–5.1)
Sodium: 142 mEq/L (ref 135–145)

## 2015-11-25 LAB — LIPID PANEL
CHOLESTEROL: 182 mg/dL (ref 0–200)
HDL: 65.2 mg/dL (ref >39.00)
LDL Cholesterol: 95 mg/dL (ref 0–99)
NONHDL: 116.86
TRIGLYCERIDES: 110 mg/dL (ref 0.0–149.0)
Total CHOL/HDL Ratio: 3
VLDL: 22 mg/dL (ref 0.0–40.0)

## 2015-11-25 NOTE — Patient Instructions (Signed)
Schedule your complete physical in 6 months We'll notify you of your lab results and make any changes if needed We'll call you with your mammogram appt at the [facility] to work on healthy diet and regular walking- you look great! Call with any questions or concerns [place]!!

## 2015-11-25 NOTE — Progress Notes (Signed)
Pre visit review using our clinic review tool, if applicable. No additional management support is needed unless otherwise documented below in the visit note. 

## 2015-11-25 NOTE — Progress Notes (Signed)
   Subjective:    Patient ID: Wanda Wright, female    DOB: 1928-09-28, 82 y.o.   MRN: [id number]  HPI Hyperlipidemia- chronic problem, on Lipitor.  Denies abd pain since ER d/c 10 days ago.  No N/V.  HTN- chronic problem, on Chlorthalidone.  Good BP control.  No CP, SOB, HAs, visual changes, edema.  L breast pain- pt reports intermittent pain along L lateral breast.  Not present today.  Due for yearly mammogram.  No lumps present.  Pt has hx of multiple breast biopsies   Review of Systems For ROS see HPI     Objective:   Physical Exam  Constitutional: She is oriented to person, place, and time. She appears well-developed and well-nourished. No distress.  HENT:  Head: Normocephalic and atraumatic.  Eyes: Conjunctivae and EOM are normal. Pupils are equal, round, and reactive to light.  Neck: Normal range of motion. Neck supple. No thyromegaly present.  Cardiovascular: Normal rate, regular rhythm, normal heart sounds and intact distal pulses.   No murmur heard. Pulmonary/Chest: Effort normal and breath sounds normal. No respiratory distress. Right breast exhibits no mass and no tenderness. Left breast exhibits tenderness. Left breast exhibits no mass and no skin change.    Abdominal: Soft. She exhibits no distension. There is no tenderness.  Musculoskeletal: She exhibits no edema.  Lymphadenopathy:    She has no cervical adenopathy.  Neurological: She is alert and oriented to person, place, and time.  Skin: Skin is warm and dry.  Psychiatric: She has a normal mood and affect. Her behavior is normal.  Vitals reviewed.         Assessment & Plan:

## 2015-11-25 NOTE — Assessment & Plan Note (Signed)
Chronic problem.  Tolerating statin w/o difficulty.  Check labs.  Adjust meds prn  

## 2015-11-25 NOTE — Assessment & Plan Note (Signed)
Recurring problem for pt.  Pain seems to be located at site of previous bx- there is a tissue defect present.  Will order diagnostic mammo for pt rather than her usual standard screen.  Pt expressed understanding and is in agreement w/ plan.

## 2015-11-25 NOTE — Assessment & Plan Note (Signed)
Chronic problem.  Good control on Chlorthalidone.  Asymptomatic.  Check labs.  No anticipated med changes.  Will follow.

## 2015-11-29 ENCOUNTER — Telehealth: Payer: Self-pay | Admitting: General Practice

## 2015-11-29 ENCOUNTER — Other Ambulatory Visit: Payer: Self-pay | Admitting: Family Medicine

## 2015-11-29 DIAGNOSIS — N644 Mastodynia: Secondary | ICD-10-CM

## 2015-11-29 MED ORDER — TRAMADOL HCL 50 MG PO TABS: 50.0000 mg | ORAL_TABLET | Freq: Four times a day (QID) | ORAL | Status: DC | PRN

## 2015-11-29 NOTE — Telephone Encounter (Signed)
Ok for #60 

## 2015-11-29 NOTE — Telephone Encounter (Signed)
Last ov 11/25/15 Tramadol last filled 11/01/15 #60 with 0

## 2015-11-29 NOTE — Telephone Encounter (Signed)
Medication filled to pharmacy as requested.   

## 2015-12-06 ENCOUNTER — Telehealth: Payer: Self-pay | Admitting: Family Medicine

## 2015-12-06 DIAGNOSIS — G459 Transient cerebral ischemic attack, unspecified: Secondary | ICD-10-CM

## 2015-12-06 NOTE — Telephone Encounter (Signed)
Please enter stat neuro referral for TIA.  Since pt is back to baseline, there is no reason for her to come in today but I do want her to see neurology

## 2015-12-06 NOTE — Telephone Encounter (Signed)
Daughter states that they believe to pt had a mini stroke, EMT came out and all vitals were good, daughter asking what should they do at this point, pt did not go to hosp.

## 2015-12-06 NOTE — Telephone Encounter (Signed)
Pt daughter informed and urgent referral placed.

## 2015-12-06 NOTE — Telephone Encounter (Signed)
Called and spoke with pt daughter Neoma Laming, advised that yesterday while at lunch pt could not hold up head and had a very hard time using her fork. When they returned home pt daughter contacted EMS through their observations the only thing off was pt's BP was 160/80 but due to symptoms they advised it sounded as though pt had a TIA. Pt ahs been fine ever since and even went to bible study this morning.

## 2015-12-08 ENCOUNTER — Ambulatory Visit
Admission: RE | Admit: 2015-12-08 | Discharge: 2015-12-08 | Disposition: A | Discharge status: Home / Self Care | Payer: Commercial Managed Care - HMO | Source: Ambulatory Visit | Attending: Family Medicine | Admitting: Family Medicine

## 2015-12-08 DIAGNOSIS — N644 Mastodynia: Secondary | ICD-10-CM

## 2015-12-08 DIAGNOSIS — N63 Unspecified lump in breast: Secondary | ICD-10-CM | POA: Diagnosis not present

## 2015-12-13 ENCOUNTER — Other Ambulatory Visit: Payer: Self-pay | Admitting: General Practice

## 2015-12-13 MED ORDER — DONEPEZIL HCL 10 MG PO TABS: ORAL_TABLET | ORAL | Status: DC

## 2015-12-23 ENCOUNTER — Telehealth: Payer: Self-pay | Admitting: Cardiology

## 2015-12-23 ENCOUNTER — Telehealth: Payer: Self-pay | Admitting: Family Medicine

## 2015-12-23 NOTE — Telephone Encounter (Signed)
Pt's daughter called in stating that yesterday the pt had an episode of chest pain that required calling the EMTs. Once they got there to check her , she says they were unable to find anything. They felt it may have been muscular related. The daughter then called her PCP and he suggested that they touch base with Dr. Stanford Breed considering that she has had a hx of MI back in 2005. Please f/u with the daughter. Pt has alzheimer symptoms.  Thanks

## 2015-12-23 NOTE — Telephone Encounter (Signed)
Based on her chest pain and known CAD, I would have her touch base w/ Dr Stanford Breed ([facility]) who may want to see pt

## 2015-12-23 NOTE — Telephone Encounter (Signed)
Patient daughter notified of PCP recommendations and is agreement and expresses an understanding.    

## 2015-12-23 NOTE — Telephone Encounter (Signed)
Spoke with pt, last night the pain was under her left breast. EMS told them the EKG was normal. When EMS pressed on the pts chest it reproduced the pain. Reassurance given that it does sound klike muscle pain not heart. Pt feels fine today, only complaint is fatigue, that the dtr states is not uncommon. Agreed to call us back for continued problems or concerns.

## 2015-12-23 NOTE — Telephone Encounter (Signed)
Daughter states that she had to call EMS last night due to pt having chest pains, an EKG was done and showed no signs of heart attack, pt did not go to ER. EMS stated maybe a pull muscle. Daughter states this morning pt seems to be fine and does not recall last nights events and asking if there is anything she should do for pt.

## 2015-12-27 ENCOUNTER — Other Ambulatory Visit: Payer: Self-pay | Admitting: General Practice

## 2015-12-27 MED ORDER — TRAMADOL HCL 50 MG PO TABS: 50.0000 mg | ORAL_TABLET | Freq: Four times a day (QID) | ORAL | Status: DC | PRN

## 2015-12-27 NOTE — Telephone Encounter (Signed)
Last OV 11/25/15 Tramadol last filled 11/29/15 #60 with 0

## 2015-12-27 NOTE — Telephone Encounter (Signed)
Medication filled to pharmacy as requested.   

## 2015-12-29 ENCOUNTER — Encounter: Payer: Self-pay | Admitting: Neurology

## 2015-12-29 ENCOUNTER — Ambulatory Visit (INDEPENDENT_AMBULATORY_CARE_PROVIDER_SITE_OTHER): Payer: Commercial Managed Care - HMO | Admitting: Neurology

## 2015-12-29 VITALS — BP 106/60 | HR 54 | Ht 63.5 in | Wt 154.0 lb

## 2015-12-29 DIAGNOSIS — G9340 Encephalopathy, unspecified: Secondary | ICD-10-CM

## 2015-12-29 DIAGNOSIS — G518 Other disorders of facial nerve: Secondary | ICD-10-CM

## 2015-12-29 DIAGNOSIS — G301 Alzheimer's disease with late onset: Secondary | ICD-10-CM | POA: Diagnosis not present

## 2015-12-29 DIAGNOSIS — G5139 Clonic hemifacial spasm, unspecified: Secondary | ICD-10-CM

## 2015-12-29 DIAGNOSIS — F0280 Dementia in other diseases classified elsewhere without behavioral disturbance: Secondary | ICD-10-CM

## 2015-12-29 NOTE — Patient Instructions (Signed)
1. We have scheduled you at [facility] for your MRI on 01/05/16 at 10:00 am. Please arrive 15 minutes prior and go to 1st floor radiology. If you need to reschedule for any reason please call [phone].

## 2015-12-29 NOTE — Progress Notes (Signed)
Wanda Wright was seen today in neurologic consultation at the request of Annye Asa, MD.  The consultation is for the evaluation of possible TIA.  I have reviewed records since last visit.  The patient's daughter called Dr. Birdie Riddle on 12/06/2015 stating that the previous day at lunch the patient had difficulty holding her fork and holding her head up (slumped).   It lasted about 5-10 minutes.  The patient doesn't remember it at all.  Her daughter provides the history.  Her daughter states that they were in a restaurant and it was loud so she couldn't really tell if speech was abnormal.   EMS was contacted and her blood pressure is 160/80.  By the time they got there, the patient was "completely back" and mad that EMS was contacted.    I reviewed her last MRI of the brain which was dated 08/10/2015.  This was done for dizziness.  There was moderate atrophy and white matter disease.  Memory loss has also been an issue.  Memory loss has been slowly going downhill for 3-4 years, but much worse over the last 2 years.  Pt doesn't think that she has a memory problem.  Pts daughter lives with her.  99% of the time, there is someone with her in the home.  She doesn't drive and hasn't for 2 years.  Her daughter does her medications.  Her daughter states that pt will "snap" easier at home but otherwise no personality changes.  No physical violence.  Sleeps well at night and sleeps during the day as well.  Appetite is normal.  Denies hallucinations.  On aricept for about 4 years per daughter.  On trazodone but doesn't take often because when she took it one time she lost bladder control in the middle of the night.  Daughter states that she probably hasn't taken it in 3 years.   ALLERGIES:   Allergies  Allergen Reactions  . Codeine     REACTION: Elevated Heart rate  . Horse-Derived Products     unknown  . Tetanus Toxoid     unknown    CURRENT MEDICATIONS:  Outpatient Encounter Prescriptions as of  12/29/2015  Medication Sig  . acetaminophen (TYLENOL) 500 MG tablet Take 1,000 mg by mouth 2 (two) times daily.   Marland Kitchen aspirin 81 MG tablet Take 81 mg by mouth daily.    Marland Kitchen atorvastatin (LIPITOR) 20 MG tablet TAKE 1 TABLET BY MOUTH AS DIRECTED  . Calcium Carbonate-Vitamin D 600-400 MG-UNIT per tablet Take 2 tablets by mouth daily.  . chlorthalidone (HYGROTON) 25 MG tablet TAKE AS DIRECTED  . cholecalciferol (VITAMIN D) 1000 UNITS tablet Take 1,000 Units by mouth daily.  Marland Kitchen docusate sodium (COLACE) 100 MG capsule Take 1 capsule (100 mg total) by mouth every 12 (twelve) hours.  Marland Kitchen donepezil (ARICEPT) 10 MG tablet TAKE 1 TABLET (10 MG TOTAL) BY MOUTH AT BEDTIME.  . fluticasone (FLONASE) 50 MCG/ACT nasal spray Place 2 sprays into both nostrils daily.  Marland Kitchen gabapentin (NEURONTIN) 100 MG capsule TAKE 1 CAPSULE BY MOUTH 3 TIMES DAILY AS NEEDED. (Patient taking differently: Take 100 mg by mouth 2 (two) times daily. )  . KLOR-CON 10 10 MEQ tablet TAKE 1 TABLET BY MOUTH 2 TIMES DAILY.  Marland Kitchen polyethylene glycol (MIRALAX / GLYCOLAX) packet Take 17 g by mouth every other day.   . psyllium (METAMUCIL) 58.6 % powder Take 1 packet by mouth as needed.  . traMADol (ULTRAM) 50 MG tablet Take 1 tablet (50 mg  total) by mouth every 6 (six) hours as needed. for pain  . traZODone (DESYREL) 50 MG tablet Take 0.5-1 tablets (25-50 mg total) by mouth at bedtime as needed for sleep. (Patient taking differently: Take 25 mg by mouth at bedtime as needed for sleep. )  . nitroGLYCERIN (NITROSTAT) 0.4 MG SL tablet Place 1 tablet (0.4 mg total) under the tongue every 5 (five) minutes as needed for chest pain. (Patient not taking: Reported on 12/29/2015)  . [DISCONTINUED] ipratropium (ATROVENT) 0.06 % nasal spray Place 2 sprays into both nostrils 2 (two) times daily.   No facility-administered encounter medications on file as of 12/29/2015.    PAST MEDICAL HISTORY:   Past Medical History  Diagnosis Date  . 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 Hyperlipidemia   . Hypertension   . Hemidiaphragm     elevated hemidiaphragm  . Colon polyps   . Osteoarthritis cervical spine   . Hearing loss   . Allergic rhinitis   . Carotid artery disease ([place])     Doppler, October, 2011, 0-39% bilateral  . Colon polyp   . Hx of CABG     2004  . Ejection fraction     normal, catheterization, 2009  . Bradycardia     January, 2013    PAST SURGICAL HISTORY:   Past Surgical History  Procedure Laterality Date  . Total abdominal hysterectomy    . Hiatal hernia repair    . Coronary artery bypass graft      ACS  . Cholecystectomy    . Breast lumpectomy      multiple, all benign  . Colonoscopy w/ polypectomy      SOCIAL HISTORY:   Social History   Social History  . Marital Status: Married    Spouse Name: N/A  . Number of Children: N/A  . Years of Education: N/A   Occupational History  . Not on file.   Social History Main Topics  . Smoking status: Former Smoker    Types: Cigarettes    Quit date: 08/07/1948  . Smokeless tobacco: Not on file  . Alcohol Use: No  . Drug Use: No  . Sexual Activity: Not on file   Other Topics Concern  . Not on file   Social History Narrative   Widowed 2000.    FAMILY HISTORY:   Family Status  Relation Status Death Age  . Mother Deceased     dementia, breast cancer  . Father Deceased     heart attack, stroke  . Sister Alive     eye cancer  . Brother Alive     heart attack  . Daughter Alive     x2 - adopted    ROS:  A complete 10 system review of systems was obtained and was unremarkable apart from what is mentioned above.  PHYSICAL EXAMINATION:    VITALS:   Filed Vitals:   12/29/15 0902  BP: 106/60  Pulse: 54  Height: 5' 3.5" (1.613 m)  Weight: 154 lb (69.854 kg)    GEN:  Normal appears female in no acute distress.  Appears stated age. HEENT:  Normocephalic,  atraumatic. The mucous membranes are moist. The superficial temporal arteries are without ropiness or tenderness. Cardiovascular: Regular rate and rhythm. Lungs: Clear to auscultation bilaterally. Neck/Heme: There are no carotid bruits noted bilaterally.  NEUROLOGICAL: Orientation:   Montreal Cognitive  Assessment  12/29/2015  Visuospatial/ Executive (0/5) 1  Naming (0/3) 0  Attention: Read list of digits (0/2) 2  Attention: Read list of letters (0/1) 1  Attention: Serial 7 subtraction starting at 100 (0/3) 1  Language: Repeat phrase (0/2) 2  Language : Fluency (0/1) 1  Abstraction (0/2) 1  Delayed Recall (0/5) 0  Orientation (0/6) 2  Total 11  Adjusted Score (based on education) 12   Cranial nerves: There is good facial symmetry.  There is R hemifacial spasm.  The pupils are equal round and reactive to light bilaterally. Fundoscopic exam reveals clear disc margins bilaterally. Extraocular muscles are intact and visual fields are full to confrontational testing. Speech is fluent and clear. Soft palate rises symmetrically and there is no tongue deviation. Hearing is intact to conversational tone. Tone: Tone is good throughout. Sensation: Sensation is intact to light touch and pinprick throughout (facial, trunk, extremities). Vibration is intact at the bilateral big toe. There is no extinction with double simultaneous stimulation. There is no sensory dermatomal level identified. Coordination:  The patient has no difficulty with RAM's or FNF bilaterally. Motor: Strength is 5/5 in the bilateral upper and lower extremities.  She does have pain with testing of the LUE.  Shoulder shrug is equal and symmetric. There is no pronator drift.  There are no fasciculations noted. DTR's: Deep tendon reflexes are 2+/4 at the bilateral biceps, triceps, brachioradialis, patella and achilles.  Plantar responses are downgoing bilaterally. Gait and Station: The patient is able to ambulate without difficulty.     IMPRESSION/PLAN  1. MS change - I do think that TIA is in the Ddx, but I also think that seizure and cardiac arrhythmia are in the Ddx.  She slumped over but kept moving the fork for 10 minutes.  -Discussed signs and symptoms of stroke, as well as morbidity and mortality associated with stroke.  She is to call 911 should she experience any of the symptoms.  We talked about changing ASA to Plavix but since I'm not convinced that this was focal neuro event decided to hold on that for now.  -recommended a carotid ultrasound - but daughter states to hold on that for now as doesn't think that patient would have surgery no matter what.  Will let me know if they change their mind  -Last lipids were checked in April, 2017.  Her LDL was 95.  Goal LDL would be less than 70 if one has had a new stroke.  However, since I am not convinced that this was a new infarct, I am not going to change lipitor today  -we will do mri with and without gad  -we will do EEG  2  Mild R hemifacial spasm  -daughter states that she has never noted it, but when I showed her in the room, she was able to see it  -as above will do MRI brain with and without gad  3.  Alzheimers dementia  -is severe.  Needs 24 hour per day care but already has that.  Is already on aricept.  Safety discussed.  4.  Will call her with results of above.  Much greater than 50% of this visit was spent in counseling and coordinating care.  Total face to face time:  60 min

## 2015-12-29 NOTE — Addendum Note (Signed)
Addended byAnnamaria Helling on: 12/29/2015 10:16 AM   Modules accepted: Orders

## 2015-12-30 ENCOUNTER — Ambulatory Visit (INDEPENDENT_AMBULATORY_CARE_PROVIDER_SITE_OTHER): Payer: Commercial Managed Care - HMO | Admitting: Neurology

## 2015-12-30 DIAGNOSIS — G9340 Encephalopathy, unspecified: Secondary | ICD-10-CM

## 2015-12-30 NOTE — Procedures (Signed)
TECHNICAL SUMMARY:  A multichannel referential and bipolar montage EEG using the standard international 10-20 system was performed on the patient described as awake, drowsy and asleep.  The dominant background activity consists of 7-8 hertz activity seen most prominantly over the posterior head region.  The backgound activity is nonreactive to eye opening and closing procedures.  Low voltage fast (beta) activity is distributed symmetrically and maximally over the anterior head regions.  ACTIVATION:  Stepwise photic stimulation at 4-20 flashes per second was performed and did not elicit any abnormal waveforms.  Hyperventilation was not performed.  EPILEPTIFORM ACTIVITY:  There were no spikes, sharp waves or paroxysmal activity.  SLEEP:  Both stage I and stage II sleep architecture are identified.  CARDIAC:  The EKG lead revealed a regular sinus rhythm.  IMPRESSION:  This is an abnormal EEG demonstrating a mild diffuse slowing of electrocerebral activity.  This can be seen in a wide variety of encephalopathic state including those of a toxic, metabolic, or degenerative nature.  There were no focal, hemispheric, or lateralizing features.  No epileptiform activity was recorded.

## 2015-12-31 ENCOUNTER — Telehealth: Payer: Self-pay | Admitting: Neurology

## 2015-12-31 NOTE — Telephone Encounter (Signed)
Tried to call patient and phone answered and hung up. Will try again later.

## 2015-12-31 NOTE — Telephone Encounter (Signed)
-----   Message from Dukes, DO sent at 12/30/2015  4:57 PM EDT ----- Let pts daughter know that no seizure waves on this EEG, brain waves just a little slow

## 2015-12-31 NOTE — Telephone Encounter (Signed)
Patient made aware.

## 2016-01-05 ENCOUNTER — Other Ambulatory Visit: Payer: Self-pay | Admitting: General Practice

## 2016-01-05 ENCOUNTER — Ambulatory Visit (HOSPITAL_COMMUNITY)
Admission: RE | Admit: 2016-01-05 | Discharge: 2016-01-05 | Disposition: A | Discharge status: Home / Self Care | Payer: Commercial Managed Care - HMO | Source: Ambulatory Visit | Attending: Neurology | Admitting: Neurology

## 2016-01-05 DIAGNOSIS — G319 Degenerative disease of nervous system, unspecified: Secondary | ICD-10-CM | POA: Insufficient documentation

## 2016-01-05 DIAGNOSIS — G5139 Clonic hemifacial spasm, unspecified: Secondary | ICD-10-CM

## 2016-01-05 DIAGNOSIS — G518 Other disorders of facial nerve: Secondary | ICD-10-CM | POA: Insufficient documentation

## 2016-01-05 DIAGNOSIS — F0280 Dementia in other diseases classified elsewhere without behavioral disturbance: Secondary | ICD-10-CM | POA: Insufficient documentation

## 2016-01-05 DIAGNOSIS — G301 Alzheimer's disease with late onset: Secondary | ICD-10-CM | POA: Diagnosis not present

## 2016-01-05 DIAGNOSIS — R531 Weakness: Secondary | ICD-10-CM | POA: Diagnosis not present

## 2016-01-05 MED ORDER — ATORVASTATIN CALCIUM 20 MG PO TABS: 20.0000 mg | ORAL_TABLET | ORAL | Status: DC

## 2016-01-05 MED ORDER — GADOBENATE DIMEGLUMINE 529 MG/ML IV SOLN
15.0000 mL | Freq: Once | INTRAVENOUS | Status: AC | PRN
  Administered 2016-01-05: 15 mL via INTRAVENOUS

## 2016-01-06 ENCOUNTER — Telehealth: Payer: Self-pay | Admitting: Neurology

## 2016-01-06 NOTE — Telephone Encounter (Signed)
Patients daughter made aware

## 2016-01-06 NOTE — Telephone Encounter (Signed)
-----   Message from Wells, DO sent at 01/06/2016  7:54 AM EDT ----- Significant atrophy.  Mild to mod WMD.  Wanda Wright you can let pt daughter know that MRI is okay, no changes from Jan

## 2016-01-24 ENCOUNTER — Other Ambulatory Visit: Payer: Self-pay | Admitting: General Practice

## 2016-01-24 MED ORDER — TRAMADOL HCL 50 MG PO TABS: 50.0000 mg | ORAL_TABLET | Freq: Four times a day (QID) | ORAL | Status: DC | PRN

## 2016-01-24 NOTE — Telephone Encounter (Signed)
Medication filled to pharmacy as requested.   

## 2016-01-24 NOTE — Telephone Encounter (Signed)
Last OV 11/25/15 Tramadol last filled 12/27/15 #60 with 0

## 2016-02-02 DIAGNOSIS — M19012 Primary osteoarthritis, left shoulder: Secondary | ICD-10-CM | POA: Diagnosis not present

## 2016-02-20 ENCOUNTER — Other Ambulatory Visit: Payer: Self-pay | Admitting: Family Medicine

## 2016-02-21 ENCOUNTER — Other Ambulatory Visit: Payer: Self-pay | Admitting: Family Medicine

## 2016-02-21 NOTE — Telephone Encounter (Signed)
Medication filled to pharmacy as requested.   

## 2016-02-21 NOTE — Telephone Encounter (Signed)
Last OV 11/25/15 Tramadol last filled 01/24/16 #60 with 0

## 2016-03-09 ENCOUNTER — Other Ambulatory Visit: Payer: Self-pay | Admitting: Family Medicine

## 2016-03-20 ENCOUNTER — Telehealth: Payer: Self-pay | Admitting: Family Medicine

## 2016-03-20 DIAGNOSIS — M25511 Pain in right shoulder: Secondary | ICD-10-CM

## 2016-03-20 NOTE — Telephone Encounter (Signed)
This referral was placed today.  

## 2016-03-20 NOTE — Telephone Encounter (Signed)
Daughter states that pt is due for shot in shoulder and needs a new referral to [facility]. Daughter states that she will call and make pt appts after referral is placed.

## 2016-03-26 ENCOUNTER — Other Ambulatory Visit: Payer: Self-pay | Admitting: Family Medicine

## 2016-03-27 NOTE — Telephone Encounter (Signed)
Medication filled to pharmacy as requested.   

## 2016-03-27 NOTE — Telephone Encounter (Signed)
Last OV 11/25/15 Tramadol last filled 02/21/16 #60 with 0

## 2016-04-03 DIAGNOSIS — M19012 Primary osteoarthritis, left shoulder: Secondary | ICD-10-CM | POA: Diagnosis not present

## 2016-04-23 ENCOUNTER — Other Ambulatory Visit: Payer: Self-pay | Admitting: Family Medicine

## 2016-04-24 ENCOUNTER — Telehealth: Payer: Self-pay | Admitting: Emergency Medicine

## 2016-04-24 DIAGNOSIS — H33319 Horseshoe tear of retina without detachment, unspecified eye: Secondary | ICD-10-CM

## 2016-04-24 DIAGNOSIS — H3530 Unspecified macular degeneration: Secondary | ICD-10-CM

## 2016-04-24 NOTE — Telephone Encounter (Signed)
Patient daughter calling requesting a referral to [facility]. She has pending appointment on 9/20.

## 2016-04-24 NOTE — Telephone Encounter (Signed)
Medication filled to pharmacy as requested.   

## 2016-04-24 NOTE — Telephone Encounter (Signed)
Last OV 11/25/15 Tramadol last filled 03/27/16 #60 with 0

## 2016-04-25 NOTE — Telephone Encounter (Signed)
Ok for referral?

## 2016-04-25 NOTE — Telephone Encounter (Signed)
Request for [facility] referral has been faxed to [employer] for approval. [facility] online is down.

## 2016-04-25 NOTE — Telephone Encounter (Signed)
Since patient already has appt, a [employer] referral just needs to be submitted on the [facility] connect portal for patient.

## 2016-04-25 NOTE — Telephone Encounter (Signed)
Referral placed. Pt has an appt tomorrow. Dr. Billie Ruddy. Pt has a retinal tear and onset of macular degeneration

## 2016-04-26 DIAGNOSIS — H353122 Nonexudative age-related macular degeneration, left eye, intermediate dry stage: Secondary | ICD-10-CM | POA: Diagnosis not present

## 2016-04-26 DIAGNOSIS — H353112 Nonexudative age-related macular degeneration, right eye, intermediate dry stage: Secondary | ICD-10-CM | POA: Diagnosis not present

## 2016-05-21 ENCOUNTER — Other Ambulatory Visit: Payer: Self-pay | Admitting: Family Medicine

## 2016-05-22 NOTE — Telephone Encounter (Signed)
Last OV 11/25/15 [place] last filled 08/30/15 #30 with 3 Tramadol last filled 04/24/16 #60 with 0

## 2016-05-22 NOTE — Telephone Encounter (Signed)
Medication filled to pharmacy as requested.   

## 2016-06-11 ENCOUNTER — Other Ambulatory Visit: Payer: Self-pay | Admitting: Family Medicine

## 2016-06-14 DIAGNOSIS — M19012 Primary osteoarthritis, left shoulder: Secondary | ICD-10-CM | POA: Diagnosis not present

## 2016-06-25 ENCOUNTER — Other Ambulatory Visit: Payer: Self-pay | Admitting: Family Medicine

## 2016-06-26 NOTE — Telephone Encounter (Signed)
Medication filled to pharmacy as requested.   

## 2016-06-26 NOTE — Telephone Encounter (Signed)
Last OV 11/25/15 Tramadol last filled 05/22/16 #60 with 0

## 2016-07-12 ENCOUNTER — Telehealth: Payer: Self-pay

## 2016-07-12 NOTE — Telephone Encounter (Signed)
Pt daughter called back, Ok to schedule AWV with you, Changed Tabori schedule to office visit and added a hold on yours.

## 2016-07-12 NOTE — Telephone Encounter (Signed)
LM requesting return call regarding AWV on 07/19/16. If patient agrees, would like patient to see Tabori at 1:30 for 30 min OV (f/u chronic illness), then [facility] Coach at 2pm.

## 2016-07-19 ENCOUNTER — Ambulatory Visit (INDEPENDENT_AMBULATORY_CARE_PROVIDER_SITE_OTHER): Payer: Commercial Managed Care - HMO | Admitting: Family Medicine

## 2016-07-19 ENCOUNTER — Encounter: Payer: Self-pay | Admitting: Family Medicine

## 2016-07-19 VITALS — BP 108/72 | HR 90 | Temp 97.7°F | Resp 17 | Ht 64.0 in | Wt 159.0 lb

## 2016-07-19 DIAGNOSIS — M810 Age-related osteoporosis without current pathological fracture: Secondary | ICD-10-CM | POA: Diagnosis not present

## 2016-07-19 DIAGNOSIS — Z23 Encounter for immunization: Secondary | ICD-10-CM | POA: Diagnosis not present

## 2016-07-19 DIAGNOSIS — I10 Essential (primary) hypertension: Secondary | ICD-10-CM

## 2016-07-19 DIAGNOSIS — E785 Hyperlipidemia, unspecified: Secondary | ICD-10-CM | POA: Diagnosis not present

## 2016-07-19 DIAGNOSIS — Z0000 Encounter for general adult medical examination without abnormal findings: Secondary | ICD-10-CM | POA: Diagnosis not present

## 2016-07-19 DIAGNOSIS — R413 Other amnesia: Secondary | ICD-10-CM

## 2016-07-19 LAB — CBC WITH DIFFERENTIAL/PLATELET
BASOS PCT: 0.7 % (ref 0.0–3.0)
Basophils Absolute: 0 10*3/uL (ref 0.0–0.1)
EOS ABS: 0.3 10*3/uL (ref 0.0–0.7)
Eosinophils Relative: 4.1 % (ref 0.0–5.0)
HEMATOCRIT: 39.8 % (ref 36.0–46.0)
HEMOGLOBIN: 13.3 g/dL (ref 12.0–15.0)
LYMPHS PCT: 20.8 % (ref 12.0–46.0)
Lymphs Abs: 1.3 10*3/uL (ref 0.7–4.0)
MCHC: 33.3 g/dL (ref 30.0–36.0)
MCV: 91.2 fl (ref 78.0–100.0)
Monocytes Absolute: 0.5 10*3/uL (ref 0.1–1.0)
Monocytes Relative: 7.4 % (ref 3.0–12.0)
Neutro Abs: 4.1 10*3/uL (ref 1.4–7.7)
Neutrophils Relative %: 67 % (ref 43.0–77.0)
Platelets: 198 10*3/uL (ref 150.0–400.0)
RBC: 4.37 Mil/uL (ref 3.87–5.11)
RDW: 13.5 % (ref 11.5–15.5)
WBC: 6.2 10*3/uL (ref 4.0–10.5)

## 2016-07-19 LAB — HEPATIC FUNCTION PANEL
ALT: 9 U/L (ref 0–35)
AST: 16 U/L (ref 0–37)
Albumin: 4.1 g/dL (ref 3.5–5.2)
Alkaline Phosphatase: 97 U/L (ref 39–117)
BILIRUBIN TOTAL: 0.5 mg/dL (ref 0.2–1.2)
Bilirubin, Direct: 0.1 mg/dL (ref 0.0–0.3)
Total Protein: 6.5 g/dL (ref 6.0–8.3)

## 2016-07-19 LAB — BASIC METABOLIC PANEL
BUN: 15 mg/dL (ref 6–23)
CALCIUM: 9.4 mg/dL (ref 8.4–10.5)
CO2: 29 meq/L (ref 19–32)
CREATININE: 1.05 mg/dL (ref 0.40–1.20)
Chloride: 103 mEq/L (ref 96–112)
GFR: 52.9 mL/min — AB (ref >60.00)
Glucose, Bld: 97 mg/dL (ref 70–99)
Potassium: 3.8 mEq/L (ref 3.5–5.1)
Sodium: 141 mEq/L (ref 135–145)

## 2016-07-19 LAB — LIPID PANEL
CHOL/HDL RATIO: 3
Cholesterol: 177 mg/dL (ref 0–200)
HDL: 51 mg/dL (ref >39.00)
NONHDL: 125.53
TRIGLYCERIDES: 302 mg/dL — AB (ref 0.0–149.0)
VLDL: 60.4 mg/dL — AB (ref 0.0–40.0)

## 2016-07-19 LAB — LDL CHOLESTEROL, DIRECT: LDL DIRECT: 79 mg/dL

## 2016-07-19 LAB — VITAMIN D 25 HYDROXY (VIT D DEFICIENCY, FRACTURES): VITD: 35.86 ng/mL (ref 30.00–100.00)

## 2016-07-19 LAB — TSH: TSH: 1.36 u[IU]/mL (ref 0.35–4.50)

## 2016-07-19 NOTE — Progress Notes (Signed)
Pre visit review using our clinic review tool, if applicable. No additional management support is needed unless otherwise documented below in the visit note. 

## 2016-07-19 NOTE — Patient Instructions (Addendum)
Follow up in 6 months to recheck BP and cholesterol We'll notify you of your lab results and make any changes if needed Continue to make healthy diet choices As the dementia worsens, she will continue to sleep more as she forgets what she is supposed to do.  If she has worsening behavioral issues, please let me know as we may need to add medication Call with any questions or concerns Happy Holidays!!!  Try to eat heart healthy diet (full of fruits, vegetables, whole grains, lean protein, water--limit salt, fat, and sugar intake) and increase physical activity as tolerated.  Continue doing brain stimulating activities (puzzles, reading, adult coloring books, staying active) to keep memory sharp.   Bring a copy of your advance directives to your next office visit.      Fall Prevention in the Home Introduction Falls can cause injuries. They can happen to people of all ages. There are many things you can do to make your home safe and to help prevent falls. What can I do on the outside of my home?  Regularly fix the edges of walkways and driveways and fix any cracks.  Remove anything that might make you trip as you walk through a door, such as a raised step or threshold.  Trim any bushes or trees on the path to your home.  Use bright outdoor lighting.  Clear any walking paths of anything that might make someone trip, such as rocks or tools.  Regularly check to see if handrails are loose or broken. Make sure that both sides of any steps have handrails.  Any raised decks and porches should have guardrails on the edges.  Have any leaves, snow, or ice cleared regularly.  Use sand or salt on walking paths during winter.  Clean up any spills in your garage right away. This includes oil or grease spills. What can I do in the bathroom?  Use night lights.  Install grab bars by the toilet and in the tub and shower. Do not use towel bars as grab bars.  Use non-skid mats or decals in  the tub or shower.  If you need to sit down in the shower, use a plastic, non-slip stool.  Keep the floor dry. Clean up any water that spills on the floor as soon as it happens.  Remove soap buildup in the tub or shower regularly.  Attach bath mats securely with double-sided non-slip rug tape.  Do not have throw rugs and other things on the floor that can make you trip. What can I do in the bedroom?  Use night lights.  Make sure that you have a light by your bed that is easy to reach.  Do not use any sheets or blankets that are too big for your bed. They should not hang down onto the floor.  Have a firm chair that has side arms. You can use this for support while you get dressed.  Do not have throw rugs and other things on the floor that can make you trip. What can I do in the kitchen?  Clean up any spills right away.  Avoid walking on wet floors.  Keep items that you use a lot in easy-to-reach places.  If you need to reach something above you, use a strong step stool that has a grab bar.  Keep electrical cords out of the way.  Do not use floor polish or wax that makes floors slippery. If you must use wax, use non-skid floor wax.  Do  not have throw rugs and other things on the floor that can make you trip. What can I do with my stairs?  Do not leave any items on the stairs.  Make sure that there are handrails on both sides of the stairs and use them. Fix handrails that are broken or loose. Make sure that handrails are as long as the stairways.  Check any carpeting to make sure that it is firmly attached to the stairs. Fix any carpet that is loose or worn.  Avoid having throw rugs at the top or bottom of the stairs. If you do have throw rugs, attach them to the floor with carpet tape.  Make sure that you have a light switch at the top of the stairs and the bottom of the stairs. If you do not have them, ask someone to add them for you. What else can I do to help prevent  falls?  Wear shoes that:  Do not have high heels.  Have rubber bottoms.  Are comfortable and fit you well.  Are closed at the toe. Do not wear sandals.  If you use a stepladder:  Make sure that it is fully opened. Do not climb a closed stepladder.  Make sure that both sides of the stepladder are locked into place.  Ask someone to hold it for you, if possible.  Clearly mark and make sure that you can see:  Any grab bars or handrails.  First and last steps.  Where the edge of each step is.  Use tools that help you move around (mobility aids) if they are needed. These include:  Canes.  Walkers.  Scooters.  Crutches.  Turn on the lights when you go into a dark area. Replace any light bulbs as soon as they burn out.  Set up your furniture so you have a clear path. Avoid moving your furniture around.  If any of your floors are uneven, fix them.  If there are any pets around you, be aware of where they are.  Review your medicines with your doctor. Some medicines can make you feel dizzy. This can increase your chance of falling. Ask your doctor what other things that you can do to help prevent falls. This information is not intended to replace advice given to you by your health care provider. Make sure you discuss any questions you have with your health care provider. Document Released: 05/20/2009 Document Revised: 12/30/2015 Document Reviewed: 08/28/2014  2017 [employer]  Health Maintenance, Female Introduction Adopting a healthy lifestyle and getting preventive care can go a long way to promote health and wellness. Talk with your health care provider about what schedule of regular examinations is right for you. This is a good chance for you to check in with your provider about disease prevention and staying healthy. In between checkups, there are plenty of things you can do on your own. Experts have done a lot of research about which lifestyle changes and preventive  measures are most likely to keep you healthy. Ask your health care provider for more information. Weight and diet Eat a healthy diet  Be sure to include plenty of vegetables, fruits, low-fat dairy products, and lean protein.  Do not eat a lot of foods high in solid fats, added sugars, or salt.  Get regular exercise. This is one of the most important things you can do for your health.  Most adults should exercise for at least 150 minutes each week. The exercise should increase your heart rate  and make you sweat (moderate-intensity exercise).  Most adults should also do strengthening exercises at least twice a week. This is in addition to the moderate-intensity exercise. Maintain a healthy weight  Body mass index (BMI) is a measurement that can be used to identify possible weight problems. It estimates body fat based on height and weight. Your health care provider can help determine your BMI and help you achieve or maintain a healthy weight.  For females 77 years of age and older:  A BMI below 18.5 is considered underweight.  A BMI of 18.5 to 24.9 is normal.  A BMI of 25 to 29.9 is considered overweight.  A BMI of 30 and above is considered obese. Watch levels of cholesterol and blood lipids  You should start having your blood tested for lipids and cholesterol at 80 years of age, then have this test every 5 years.  You may need to have your cholesterol levels checked more often if:  Your lipid or cholesterol levels are high.  You are older than 80 years of age.  You are at high risk for heart disease. Cancer screening Lung Cancer  Lung cancer screening is recommended for adults 11-85 years old who are at high risk for lung cancer because of a history of smoking.  A yearly low-dose CT scan of the lungs is recommended for people who:  Currently smoke.  Have quit within the past 15 years.  Have at least a 30-pack-year history of smoking. A pack year is smoking an average of  one pack of cigarettes a day for 1 year.  Yearly screening should continue until it has been 15 years since you quit.  Yearly screening should stop if you develop a health problem that would prevent you from having lung cancer treatment. Breast Cancer  Practice breast self-awareness. This means understanding how your breasts normally appear and feel.  It also means doing regular breast self-exams. Let your health care provider know about any changes, no matter how small.  If you are in your 20s or 30s, you should have a clinical breast exam (CBE) by a health care provider every 1-3 years as part of a regular health exam.  If you are 37 or older, have a CBE every year. Also consider having a breast X-ray (mammogram) every year.  If you have a family history of breast cancer, talk to your health care provider about genetic screening.  If you are at high risk for breast cancer, talk to your health care provider about having an MRI and a mammogram every year.  Breast cancer gene (BRCA) assessment is recommended for women who have family members with BRCA-related cancers. BRCA-related cancers include:  Breast.  Ovarian.  Tubal.  Peritoneal cancers.  Results of the assessment will determine the need for genetic counseling and BRCA1 and BRCA2 testing. Cervical Cancer  Your health care provider may recommend that you be screened regularly for cancer of the pelvic organs (ovaries, uterus, and vagina). This screening involves a pelvic examination, including checking for microscopic changes to the surface of your cervix (Pap test). You may be encouraged to have this screening done every 3 years, beginning at age 8.  For women ages 33-65, health care providers may recommend pelvic exams and Pap testing every 3 years, or they may recommend the Pap and pelvic exam, combined with testing for human papilloma virus (HPV), every 5 years. Some types of HPV increase your risk of cervical cancer.  Testing for HPV may also be  done on women of any age with unclear Pap test results.  Other health care providers may not recommend any screening for nonpregnant women who are considered low risk for pelvic cancer and who do not have symptoms. Ask your health care provider if a screening pelvic exam is right for you.  If you have had past treatment for cervical cancer or a condition that could lead to cancer, you need Pap tests and screening for cancer for at least 20 years after your treatment. If Pap tests have been discontinued, your risk factors (such as having a new sexual partner) need to be reassessed to determine if screening should resume. Some women have medical problems that increase the chance of getting cervical cancer. In these cases, your health care provider may recommend more frequent screening and Pap tests. Colorectal Cancer  This type of cancer can be detected and often prevented.  Routine colorectal cancer screening usually begins at 80 years of age and continues through 80 years of age.  Your health care provider may recommend screening at an earlier age if you have risk factors for colon cancer.  Your health care provider may also recommend using home test kits to check for hidden blood in the stool.  A small camera at the end of a tube can be used to examine your colon directly (sigmoidoscopy or colonoscopy). This is done to check for the earliest forms of colorectal cancer.  Routine screening usually begins at age 66.  Direct examination of the colon should be repeated every 5-10 years through 80 years of age. However, you may need to be screened more often if early forms of precancerous polyps or small growths are found. Skin Cancer  Check your skin from head to toe regularly.  Tell your health care provider about any new moles or changes in moles, especially if there is a change in a mole's shape or color.  Also tell your health care provider if you have a mole  that is larger than the size of a pencil eraser.  Always use sunscreen. Apply sunscreen liberally and repeatedly throughout the day.  Protect yourself by wearing long sleeves, pants, a wide-brimmed hat, and sunglasses whenever you are outside. Heart disease, diabetes, and high blood pressure  High blood pressure causes heart disease and increases the risk of stroke. High blood pressure is more likely to develop in:  People who have blood pressure in the high end of the normal range (130-139/85-89 mm Hg).  People who are overweight or obese.  People who are African American.  If you are 73-84 years of age, have your blood pressure checked every 3-5 years. If you are 58 years of age or older, have your blood pressure checked every year. You should have your blood pressure measured twice-once when you are at a hospital or clinic, and once when you are not at a hospital or clinic. Record the average of the two measurements. To check your blood pressure when you are not at a hospital or clinic, you can use:  An automated blood pressure machine at a pharmacy.  A home blood pressure monitor.  If you are between 72 years and 62 years old, ask your health care provider if you should take aspirin to prevent strokes.  Have regular diabetes screenings. This involves taking a blood sample to check your fasting blood sugar level.  If you are at a normal weight and have a low risk for diabetes, have this test once every three years  after 80 years of age.  If you are overweight and have a high risk for diabetes, consider being tested at a younger age or more often. Preventing infection Hepatitis B  If you have a higher risk for hepatitis B, you should be screened for this virus. You are considered at high risk for hepatitis B if:  You were born in a country where hepatitis B is common. Ask your health care provider which countries are considered high risk.  Your parents were born in a high-risk  country, and you have not been immunized against hepatitis B (hepatitis B vaccine).  You have HIV or AIDS.  You use needles to inject street drugs.  You live with someone who has hepatitis B.  You have had sex with someone who has hepatitis B.  You get hemodialysis treatment.  You take certain medicines for conditions, including cancer, organ transplantation, and autoimmune conditions. Hepatitis C  Blood testing is recommended for:  Everyone born from [birth year] through 1963.  Anyone with known risk factors for hepatitis C. Sexually transmitted infections (STIs)  You should be screened for sexually transmitted infections (STIs) including gonorrhea and chlamydia if:  You are sexually active and are younger than 80 years of age.  You are older than 80 years of age and your health care provider tells you that you are at risk for this type of infection.  Your sexual activity has changed since you were last screened and you are at an increased risk for chlamydia or gonorrhea. Ask your health care provider if you are at risk.  If you do not have HIV, but are at risk, it may be recommended that you take a prescription medicine daily to prevent HIV infection. This is called pre-exposure prophylaxis (PrEP). You are considered at risk if:  You are sexually active and do not regularly use condoms or know the HIV status of your partner(s).  You take drugs by injection.  You are sexually active with a partner who has HIV. Talk with your health care provider about whether you are at high risk of being infected with HIV. If you choose to begin PrEP, you should first be tested for HIV. You should then be tested every 3 months for as long as you are taking PrEP. Pregnancy  If you are premenopausal and you may become pregnant, ask your health care provider about preconception counseling.  If you may become pregnant, take 400 to 800 micrograms (mcg) of folic acid every day.  If you want to prevent  pregnancy, talk to your health care provider about birth control (contraception). Osteoporosis and menopause  Osteoporosis is a disease in which the bones lose minerals and strength with aging. This can result in serious bone fractures. Your risk for osteoporosis can be identified using a bone density scan.  If you are 78 years of age or older, or if you are at risk for osteoporosis and fractures, ask your health care provider if you should be screened.  Ask your health care provider whether you should take a calcium or vitamin D supplement to lower your risk for osteoporosis.  Menopause may have certain physical symptoms and risks.  Hormone replacement therapy may reduce some of these symptoms and risks. Talk to your health care provider about whether hormone replacement therapy is right for you. Follow these instructions at home:  Schedule regular health, dental, and eye exams.  Stay current with your immunizations.  Do not use any tobacco products including cigarettes, chewing tobacco,  or electronic cigarettes.  If you are pregnant, do not drink alcohol.  If you are breastfeeding, limit how much and how often you drink alcohol.  Limit alcohol intake to no more than 1 drink per day for nonpregnant women. One drink equals 12 ounces of beer, 5 ounces of wine, or 1 ounces of hard liquor.  Do not use street drugs.  Do not share needles.  Ask your health care provider for help if you need support or information about quitting drugs.  Tell your health care provider if you often feel depressed.  Tell your health care provider if you have ever been abused or do not feel safe at home. This information is not intended to replace advice given to you by your health care provider. Make sure you discuss any questions you have with your health care provider. Document Released: 02/06/2011 Document Revised: 12/30/2015 Document Reviewed: 04/27/2015  2017 [employer]

## 2016-07-19 NOTE — Assessment & Plan Note (Signed)
Chronic problem.  UTD on DEXA.  On Ca and Vit D but no bisphosphonate tx at this time.  Check Vit D.  Replete prn.
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
## 2016-07-19 NOTE — Assessment & Plan Note (Signed)
Chronic problem.  Tolerating statin w/o difficulty.  Check labs.  Adjust meds prn  

## 2016-07-19 NOTE — Assessment & Plan Note (Signed)
Pt's PE unchanged w/o difficulty.  UTD on mammo, flex sig.  Got Pneumovax today.  UTD on Prevnar and flu.  Written screening schedule updated and given to pt.  Check labs.  Anticipatory guidance provided.
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
## 2016-07-19 NOTE — Assessment & Plan Note (Signed)
Chronic problem.  Well controlled.  Asymptomatic at this time.  Check labs.  No anticipated med changes.  Will follow. 

## 2016-07-19 NOTE — Progress Notes (Signed)
Subjective:   Wanda Wright is a 82 y.o. female who presents for Medicare Annual (Subsequent) preventive examination. Patient accompanied by daughter Neoma Laming.   The Patient was informed that the wellness visit is to identify future health risk and educate and initiate measures that can reduce risk for increased disease through the lifespan.   Describes health as fair, good or great? "fair"  Review of Systems:  No ROS.  Medicare Wellness Visit.  Cardiac Risk Factors include: advanced age (>23men, >57 women);dyslipidemia   Sleep patterns: Sleeps 10 hours, naps during the day.    Home Safety/Smoke Alarms: Smoke detectors in place.   Living environment; residence and Firearm Safety: Daughter lives with patient in 1 story home.  Firearms locked away.  Seat Belt Safety/Bike Helmet: Wears seat belt.    Counseling:   Eye Exam-Last exam 2017, appointment 09/2016. [facility] specialist 10/25/2016. Macular degeneration. [facility]-Last exam 03/2016, every 6 months. Christie Nottingham  Female:   Pap-N/A      Mammo-12/08/2015, dense. Ultrasound, benign.  Dexa scan-09/18/2014, osteopenia.        CCS-colonoscopy 08/11/2004, negative. No recall.   Flex sigmoid. 01/06/2014      Objective:     Vitals: BP 108/72   Pulse 90   Temp 97.7 F (36.5 C) (Oral)   Resp 17   Ht 5\' 4"  (1.626 m)   Wt 159 lb (72.1 kg)   SpO2 97%   BMI 27.29 kg/m   Body mass index is 27.29 kg/m.   Tobacco History  Smoking Status  . Former Smoker  . Types: Cigarettes  . Quit date: 08/07/1948  Smokeless Tobacco  . Never Used     Counseling given: Yes   Past Medical History:  Diagnosis Date  . Allergic rhinitis   . Bradycardia    January, 2013  . 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 Carotid artery disease ([place])    Doppler, October, 2011, 0-39% bilateral  . Colon polyp   . Colon polyps   .  Diabetes mellitus without complication ([place])   . Ejection fraction    normal, catheterization, 2009  . Hearing loss   . Hemidiaphragm    elevated hemidiaphragm  . Hx of CABG    2004  . Hyperlipidemia   . Hypertension   . Osteoarthritis cervical spine    Past Surgical History:  Procedure Laterality Date  . BREAST LUMPECTOMY     multiple, all benign  . CATARACT EXTRACTION Bilateral   . CHOLECYSTECTOMY    . COLONOSCOPY W/ POLYPECTOMY    . CORONARY ARTERY BYPASS GRAFT     ACS  . HIATAL HERNIA REPAIR    . lid lift Bilateral   . TOTAL ABDOMINAL HYSTERECTOMY     Family History  Problem Relation Age of Onset  . Dementia Mother   . Breast cancer Mother   . Heart attack Father   . Stroke Father    History  Sexual Activity  . Sexual activity: Not on file    Outpatient Encounter Prescriptions as of 07/19/2016  Medication Sig  . acetaminophen (TYLENOL) 500 MG tablet Take 1,000 mg by mouth 2 (two) times daily.   Marland Kitchen aspirin 81 MG tablet Take 81 mg by mouth daily.    Marland Kitchen atorvastatin (LIPITOR) 20 MG tablet TAKE 1 TABLET BY MOUTH AS DIRECTED  . Calcium Carbonate-Vitamin D 600-400 MG-UNIT per tablet Take  2 tablets by mouth daily.  . chlorthalidone (HYGROTON) 25 MG tablet TAKE AS DIRECTED  . cholecalciferol (VITAMIN D) 1000 UNITS tablet Take 1,000 Units by mouth daily.  Marland Kitchen docusate sodium (COLACE) 100 MG capsule TAKE ONE CAPSULE BY MOUTH EVERY 12 HOURS  . donepezil (ARICEPT) 10 MG tablet TAKE 1 TABLET (10 MG TOTAL) BY MOUTH AT BEDTIME.  Marland Kitchen gabapentin (NEURONTIN) 100 MG capsule TAKE 1 CAPSULE BY MOUTH 3 TIMES DAILY AS NEEDED. (Patient taking differently: Take 100 mg by mouth 2 (two) times daily. )  . KLOR-CON 10 10 MEQ tablet TAKE 1 TABLET BY MOUTH 2 TIMES DAILY.  Marland Kitchen loratadine (CLARITIN) 10 MG tablet Take 10 mg by mouth daily.  . polyethylene glycol (MIRALAX / GLYCOLAX) packet Take 17 g by mouth every other day.   . psyllium (METAMUCIL) 58.6 % powder Take 1 packet by mouth as needed.  .  traMADol (ULTRAM) 50 MG tablet TAKE 1 TABLET BY MOUTH EVERY 6 HOURS AS NEEDED FOR PAIN  . [DISCONTINUED] traZODone (DESYREL) 50 MG tablet Take 0.5-1 tablets (25-50 mg total) by mouth at bedtime as needed for sleep. (Patient taking differently: Take 25 mg by mouth at bedtime as needed for sleep. )  . fluticasone (FLONASE) 50 MCG/ACT nasal spray Place 2 sprays into both nostrils daily. (Patient not taking: Reported on 07/19/2016)  . nitroGLYCERIN (NITROSTAT) 0.4 MG SL tablet Place 1 tablet (0.4 mg total) under the tongue every 5 (five) minutes as needed for chest pain. (Patient not taking: Reported on 07/19/2016)   No facility-administered encounter medications on file as of 07/19/2016.     Activities of Daily Living In your present state of health, do you have any difficulty performing the following activities: 07/19/2016 07/19/2016  Hearing? - Y  Vision? - Y  Difficulty concentrating or making decisions? - N  Walking or climbing stairs? - Y  Dressing or bathing? - N  Doing errands, shopping? - Y  Preparing Food and eating ? Y -  Using the Toilet? N -  In the past six months, have you accidently leaked urine? Y -  Do you have problems with loss of bowel control? N -  Managing your Medications? Y -  Managing your Finances? Y -  Housekeeping or managing your Housekeeping? Y -  Some recent data might be hidden    Patient Care Team: Midge Minium, MD as PCP - General Marchia Bond, MD as Consulting Physician (Orthopedic Surgery) Melida Quitter, MD as Consulting Physician (Otolaryngology) Lavonna Monarch, MD as Consulting Physician (Dermatology) Milus Height, MD as Referring Physician (Ophthalmology) Luberta Mutter, MD as Consulting Physician (Ophthalmology) Carol Ada, MD as Consulting Physician (Gastroenterology) Lelon Perla, MD as Consulting Physician (Cardiology) Ara Kussmaul, MD as Consulting Physician (Ophthalmology) Christie Nottingham (Dentistry)    Assessment:      Physical assessment deferred to PCP.  Exercise Activities and Dietary recommendations Current Exercise Habits: The patient does not participate in regular exercise at present (due to cold weather, no longer walking), Exercise limited by: None identified   Diet (meal preparation, eat out, water intake, caffeinated beverages, dairy products, fruits and vegetables): water, diet soda.   Breakfast: chocolate bar Lunch: meat, vegetables, salad Klondike bar every night.   Discussed healthy food choices, increasing water.  Encouraged patient to continue to be active, patient would like to increase walking.   Goals      Patient Stated   . patient (pt-stated)          Would like to start walking  again.       Fall Risk Fall Risk  07/19/2016 05/27/2015 04/10/2014 07/08/2013 01/03/2013  Falls in the past year? No No No No No  Some recent data might be hidden   Depression Screen PHQ 2/9 Scores 07/19/2016 05/27/2015 04/10/2014 07/08/2013  PHQ - 2 Score 0 0 0 0  PHQ- 9 Score 0 - - -  Some recent data might be hidden     Cognitive Function MMSE - Mini Mental State Exam 07/19/2016  Not completed: Unable to complete  Some recent data might be hidden     Montreal Cognitive Assessment  12/29/2015  Visuospatial/ Executive (0/5) 1  Naming (0/3) 0  Attention: Read list of digits (0/2) 2  Attention: Read list of letters (0/1) 1  Attention: Serial 7 subtraction starting at 100 (0/3) 1  Language: Repeat phrase (0/2) 2  Language : Fluency (0/1) 1  Abstraction (0/2) 1  Delayed Recall (0/5) 0  Orientation (0/6) 2  Total 11  Adjusted Score (based on education) 12  Some recent data might be hidden      Immunization History  Administered Date(s) Administered  . H1N1 07/14/2008  . Influenza Split 05/11/2011  . Influenza Whole 08/08/2003, 06/05/2007, 04/29/2008, 05/03/2009, 06/13/2010  . Influenza, High Dose Seasonal PF 06/06/2013  . Influenza,inj,Quad PF,36+ Mos 04/10/2014, 05/27/2015,  06/07/2016  . Pneumococcal Conjugate-13 04/10/2014  . Pneumococcal Polysaccharide-23 08/07/2004, 07/19/2016  . Td 08/07/1998, 05/03/2009  . Zoster 04/29/2008   Screening Tests Health Maintenance  Topic Date Due  . MAMMOGRAM  12/07/2016  . INFLUENZA VACCINE  Completed  . DEXA SCAN  Completed  . ZOSTAVAX  Completed  . PNA vac Low Risk Adult  Completed      Plan:     Try to eat heart healthy diet (full of fruits, vegetables, whole grains, lean protein, water--limit salt, fat, and sugar intake) and increase physical activity as tolerated.  Continue doing brain stimulating activities (puzzles, reading, adult coloring books, staying active) to keep memory sharp.   Bring a copy of your advance directives to your next office visit.  During the course of the visit the patient was educated and counseled about the following appropriate screening and preventive services:   Vaccines to include Pneumoccal, Influenza, Hepatitis B, Td, Zostavax, HCV  Cardiovascular Disease  Colorectal cancer screening  Bone density screening  Diabetes screening  Glaucoma screening  Mammography/PAP  Nutrition counseling   Patient Instructions (the written plan) was given to the patient.   Gerilyn Nestle, RN  07/19/2016

## 2016-07-23 ENCOUNTER — Other Ambulatory Visit: Payer: Self-pay | Admitting: Family Medicine

## 2016-07-24 NOTE — Telephone Encounter (Signed)
Last OV 07/19/16 Tramadol last filled 06/26/16 #60 with 0

## 2016-07-24 NOTE — Telephone Encounter (Signed)
Medication filled to pharmacy as requested.   

## 2016-08-27 ENCOUNTER — Other Ambulatory Visit: Payer: Self-pay | Admitting: Family Medicine

## 2016-08-28 NOTE — Telephone Encounter (Signed)
Last OV 07/19/16 Tramadol last filled 07/24/16 #60 with 0

## 2016-08-28 NOTE — Telephone Encounter (Signed)
Indication for chronic opioid: spinal arthritis Medication and dose: Tramadol 50mg  prn # pills per month: 60 Last UDS date:   Pain contract signed (Y/N): Y Date narcotic database last reviewed (include red flags): 08/28/16- no red flags

## 2016-09-08 ENCOUNTER — Other Ambulatory Visit: Payer: Self-pay | Admitting: Family Medicine

## 2016-09-18 ENCOUNTER — Telehealth: Payer: Self-pay | Admitting: Family Medicine

## 2016-09-18 ENCOUNTER — Encounter: Payer: Self-pay | Admitting: Family Medicine

## 2016-09-18 ENCOUNTER — Ambulatory Visit (INDEPENDENT_AMBULATORY_CARE_PROVIDER_SITE_OTHER): Payer: Medicare HMO | Admitting: Family Medicine

## 2016-09-18 ENCOUNTER — Other Ambulatory Visit: Payer: Self-pay | Admitting: Family Medicine

## 2016-09-18 VITALS — BP 118/78 | HR 76 | Temp 98.4°F | Resp 17 | Ht 64.0 in | Wt 156.0 lb

## 2016-09-18 DIAGNOSIS — L03113 Cellulitis of right upper limb: Secondary | ICD-10-CM

## 2016-09-18 DIAGNOSIS — L309 Dermatitis, unspecified: Secondary | ICD-10-CM

## 2016-09-18 MED ORDER — TRIAMCINOLONE ACETONIDE 0.1 % EX OINT: 1.0000 "application " | TOPICAL_OINTMENT | Freq: Two times a day (BID) | CUTANEOUS | 1 refills | Status: AC

## 2016-09-18 MED ORDER — CEPHALEXIN 500 MG PO CAPS: 500.0000 mg | ORAL_CAPSULE | Freq: Three times a day (TID) | ORAL | 0 refills | Status: AC

## 2016-09-18 NOTE — Patient Instructions (Signed)
Follow up as needed/scheduled Start the Keflex 3x/day x7 days for the infection Apply the triamcinolone ointment to both hands over the red, dry cracked areas twice daily Try and avoid excessive washing as this will further dry hands Call with any questions or concerns- particularly if worsening Hang in there!

## 2016-09-18 NOTE — Telephone Encounter (Signed)
Patient is calling with concerns about the abx prescribed. She is concerned that it would cause diarrhea. Please give them a call at the # listed.

## 2016-09-18 NOTE — Progress Notes (Signed)
Pre visit review using our clinic review tool, if applicable. No additional management support is needed unless otherwise documented below in the visit note. 

## 2016-09-18 NOTE — Progress Notes (Signed)
   Subjective:    Patient ID: Wanda Wright, female    DOB: [birth year], 83 y.o.   MRN: [id number]  HPI R hand pain- dorsum of pt's R hand is very red.  Daughter noticed redness on Wednesday.  Pt started complaining of pain on Saturday.  Area is warm to touch.  Some pain w/ flexion of MCP joints but worse w/ direct pressure on the hand.   Review of Systems For ROS see HPI     Objective:   Physical Exam  Constitutional: She appears well-developed and well-nourished. No distress.  Musculoskeletal: She exhibits no edema (no swelling of R MCPs, full ROM).  Skin: Skin is warm and dry. There is erythema (pt w/ redness over R dorsal MCP joints consistent w/ cellulitis overlying eczema of hand).  Vitals reviewed.         Assessment & Plan:  Cellulitis- new.  Pt w/ cellulitis secondary to underlying hand eczema.  Start Keflex TID for infxn while treating underlying eczema w/ triamcinolone.  No apparent joint involvement.  Reviewed supportive care and red flags that should prompt return.  Pt expressed understanding and is in agreement w/ plan.

## 2016-09-19 NOTE — Telephone Encounter (Signed)
This abx is probably the least likely of all of them to cause diarrhea.  She needs to take this to prevent her infection from spreading and needing IV abx

## 2016-09-19 NOTE — Telephone Encounter (Signed)
Patient notified of PCP recommendations and is agreement and expresses an understanding.  

## 2016-09-24 ENCOUNTER — Other Ambulatory Visit: Payer: Self-pay | Admitting: Family Medicine

## 2016-09-25 NOTE — Telephone Encounter (Signed)
Last OV 09/18/16 Tramadol last filled 08/28/16 #60 with 0

## 2016-09-25 NOTE — Telephone Encounter (Signed)
Medication filled to pharmacy as requested.   

## 2016-10-02 DIAGNOSIS — Z961 Presence of intraocular lens: Secondary | ICD-10-CM | POA: Diagnosis not present

## 2016-10-04 DIAGNOSIS — M19012 Primary osteoarthritis, left shoulder: Secondary | ICD-10-CM | POA: Diagnosis not present

## 2016-10-08 ENCOUNTER — Other Ambulatory Visit: Payer: Self-pay | Admitting: Family Medicine

## 2016-10-17 IMAGING — DX DG ABDOMEN ACUTE W/ 1V CHEST
4 series · 4 of 4 positions shown · non-contrast
Comparison: Abdominal radiograph performed 10/17/2013

CLINICAL DATA: Acute onset of right-sided abdominal pain. Initial
encounter.

EXAM:
DG ABDOMEN ACUTE W/ 1V CHEST

[chest pa]
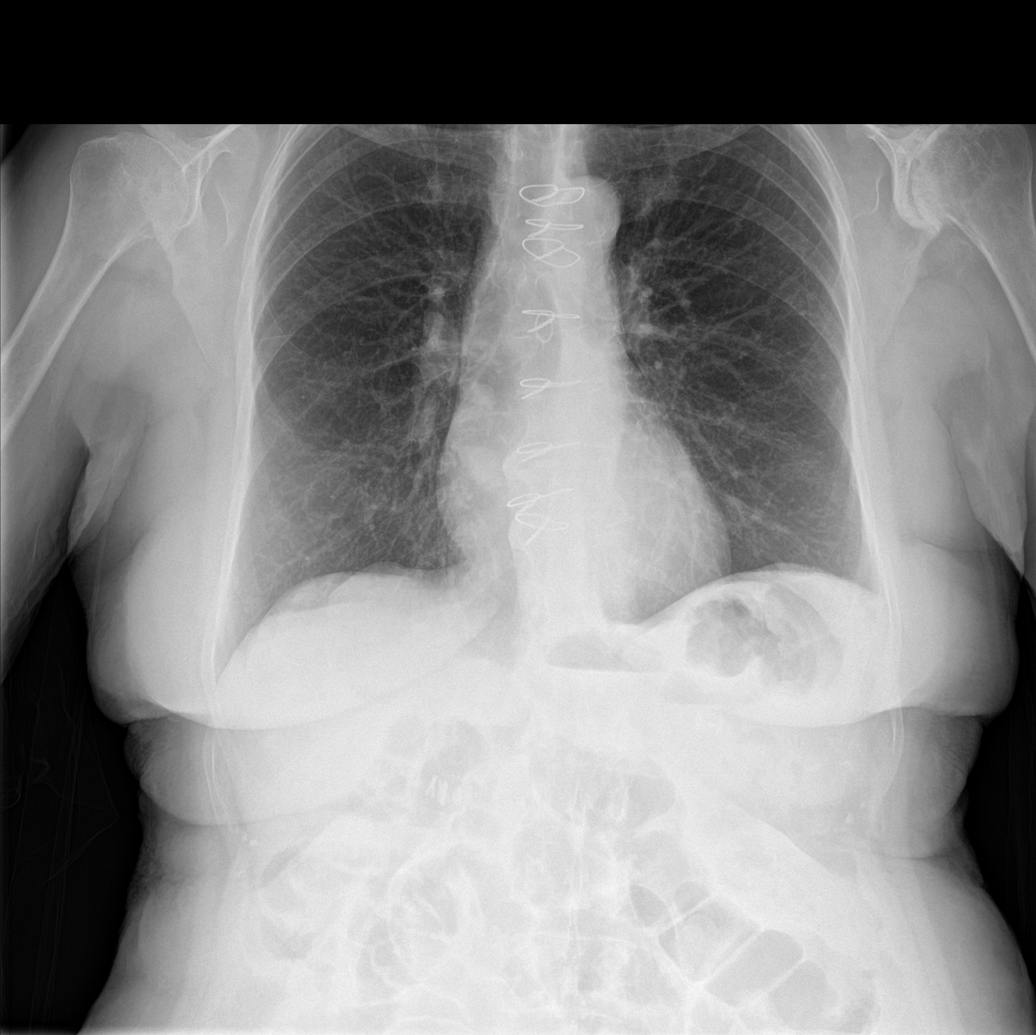

[abdomen erect]
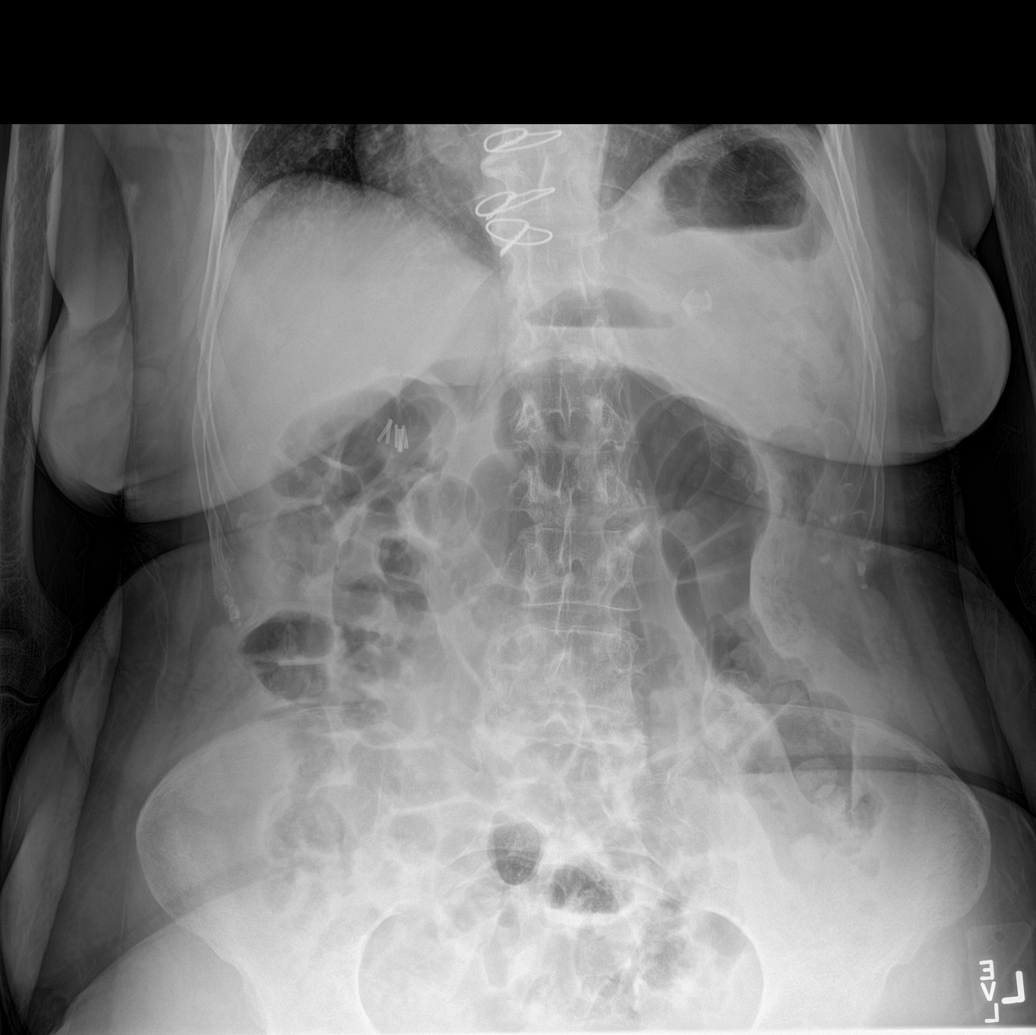

[abdomen supine (1 of 2)]
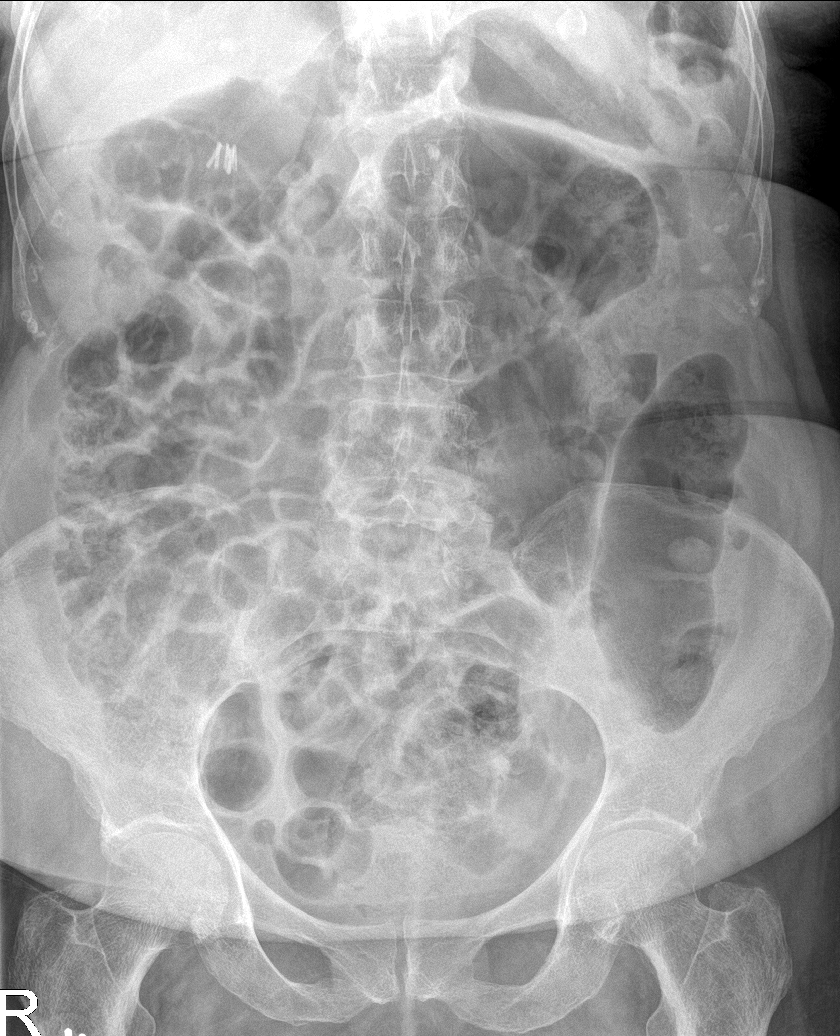

[abdomen supine (2 of 2)]
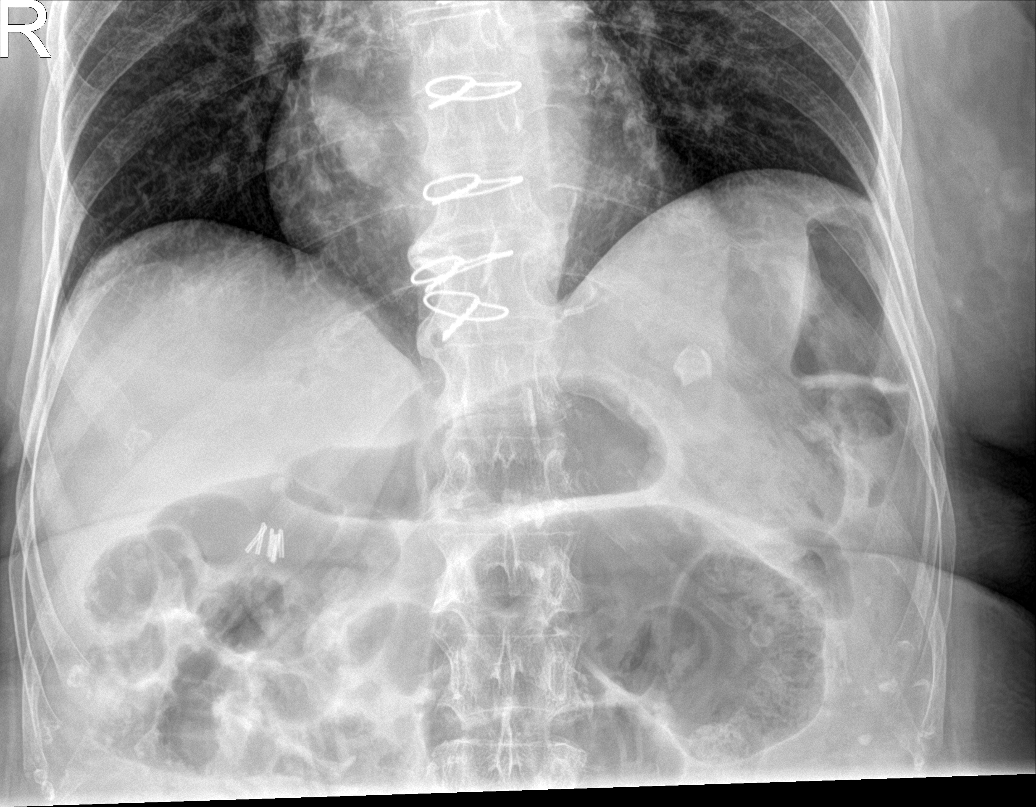

[4 of 4 positions shown; findings below may reference images not displayed]

FINDINGS: The lungs are well-aerated and clear. There is no evidence of focal
opacification, pleural effusion or pneumothorax. The patient is
status post median sternotomy. The cardiomediastinal silhouette
remains normal in size.

The visualized bowel gas pattern is unremarkable. Scattered stool
and air are seen within the colon; there is no evidence of small
bowel dilatation to suggest obstruction. No free intra-abdominal air
is identified on the provided upright view. Clips are noted within
the right upper quadrant, reflecting prior cholecystectomy.

No acute osseous abnormalities are seen; the sacroiliac joints are
unremarkable in appearance.
IMPRESSION: 1. Unremarkable bowel gas pattern; no free intra-abdominal air seen.
Moderate amount of stool noted in the colon.
2. No acute cardiopulmonary process seen.

## 2016-10-18 IMAGING — CT CT ABD-PELV W/ CM
2 of 5 series · 16 of 46 positions shown, 18 images · IV contrast (APPLIED)
Comparison: Radiographs 11/15/2015

ADDENDUM:
The appendix is normal.
CLINICAL DATA: Right upper quadrant pain 4 days ago. Now with
constipation and periumbilical pain.

EXAM:
CT ABDOMEN AND PELVIS WITH CONTRAST
TECHNIQUE: Multidetector CT imaging of the abdomen and pelvis was performed
using the standard protocol following bolus administration of
intravenous contrast.
CONTRAST:  80mL JFY10T-RJJ IOPAMIDOL (JFY10T-RJJ) INJECTION 61%

[Series 2: axial st · axial · 0.85mm/px · z∈[-488,-88]mm · 13 of 90 slices shown, 15 images]
[im 5/90  soft-tissue]
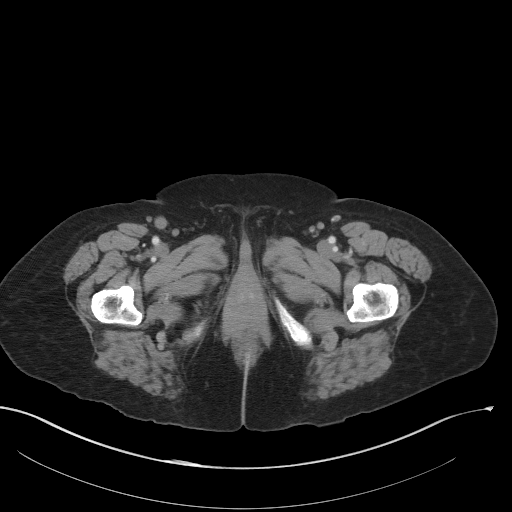
[im 5/90  bone]
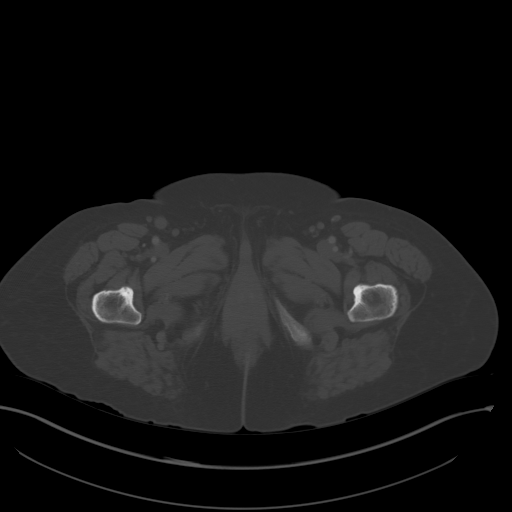
[im 15/90  soft-tissue]
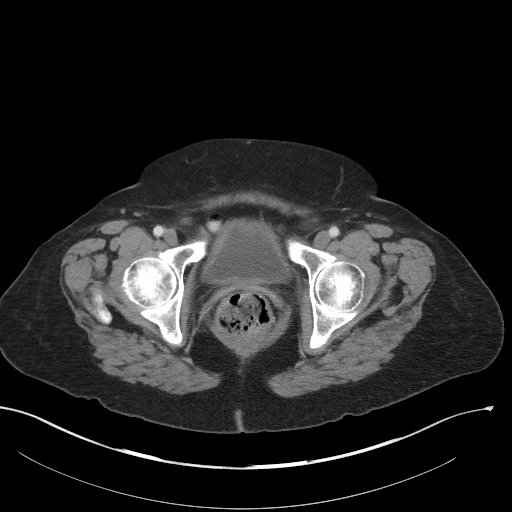
[im 19/90  soft-tissue]
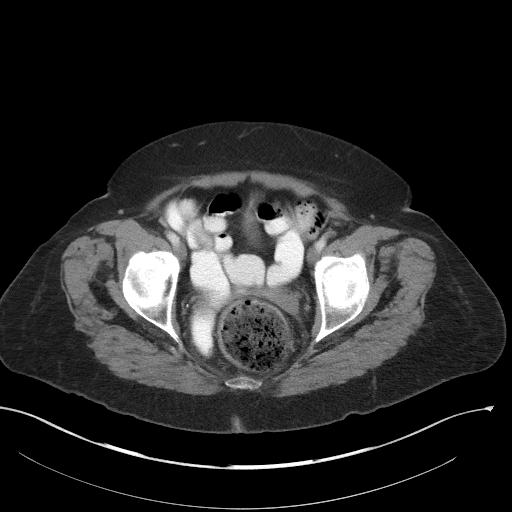
[im 24/90  soft-tissue]
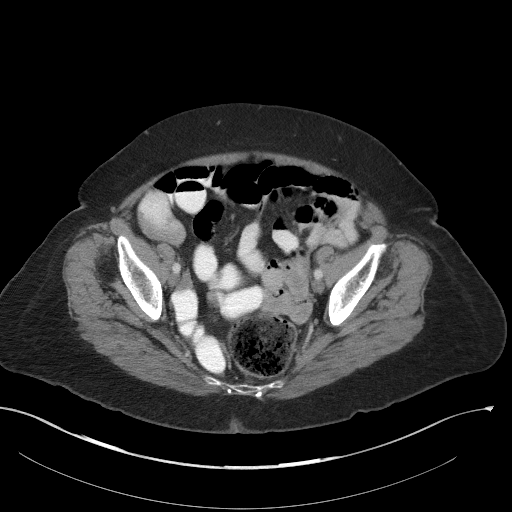
[im 33/90  soft-tissue]
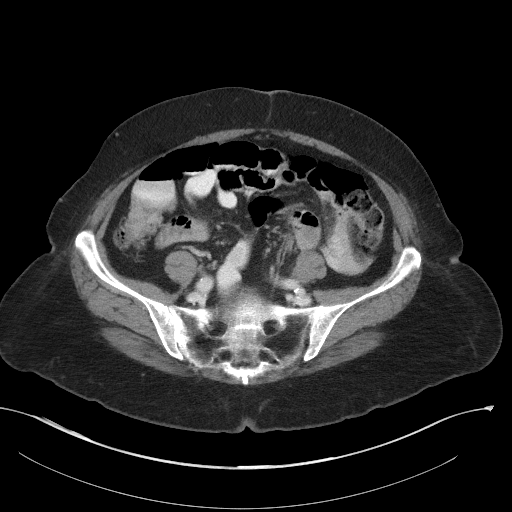
[im 38/90  soft-tissue]
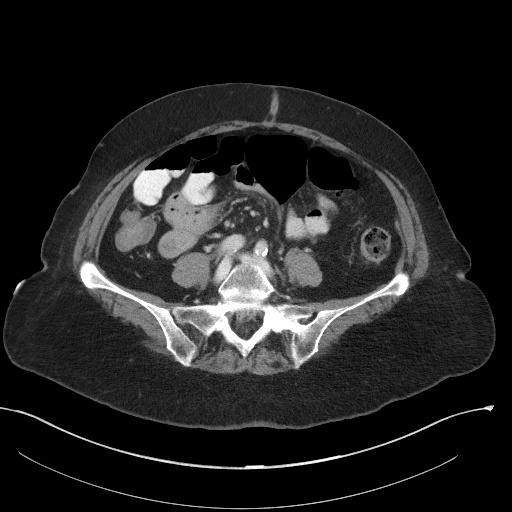
[im 47/90  soft-tissue]
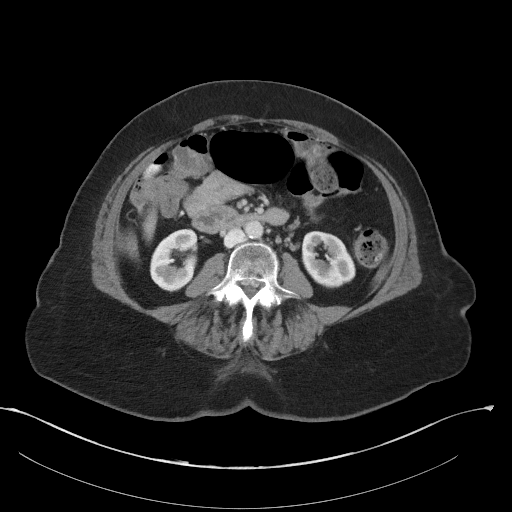
[im 52/90  soft-tissue]
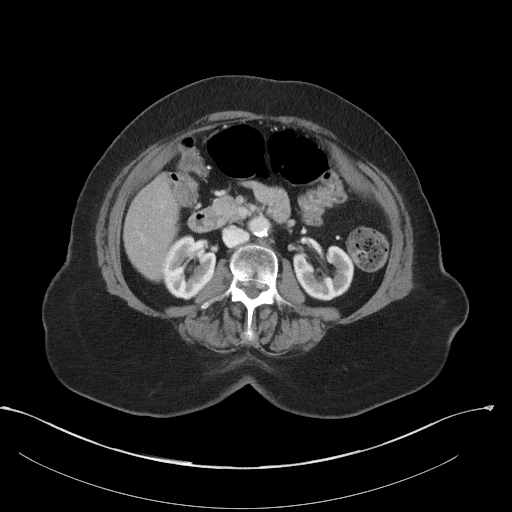
[im 57/90  soft-tissue]
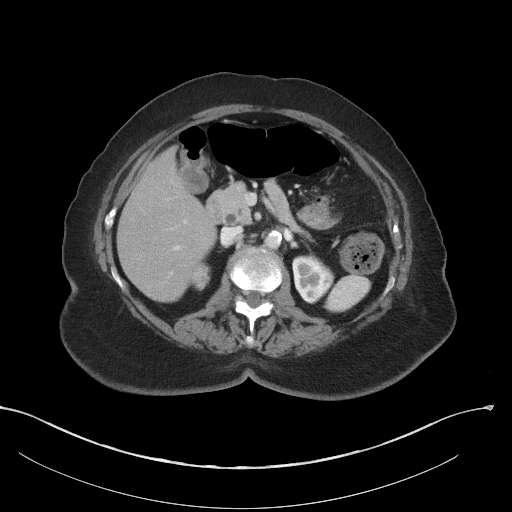
[im 57/90  bone]
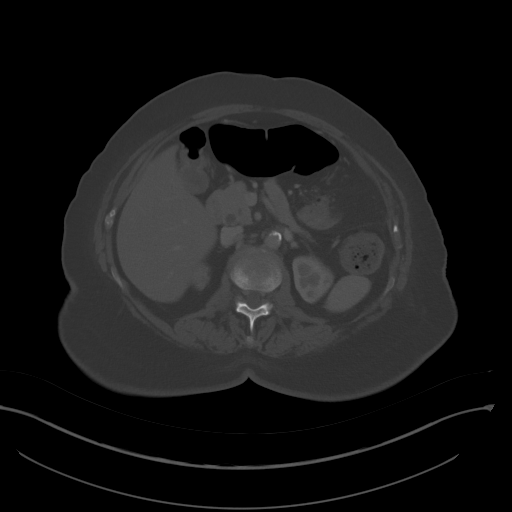
[im 66/90  soft-tissue]
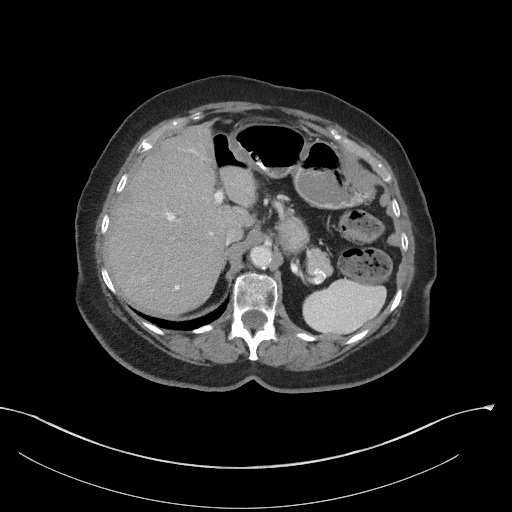
[im 71/90  soft-tissue]
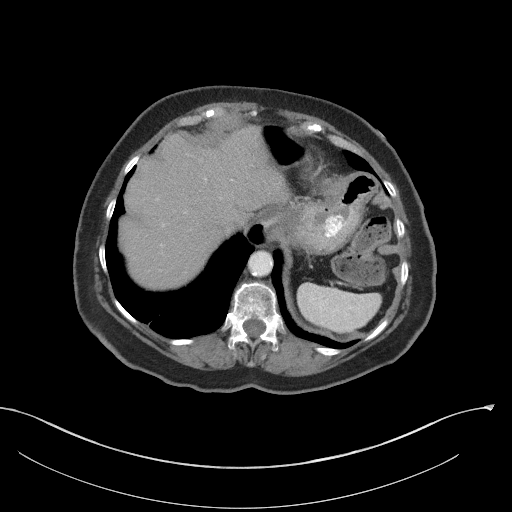
[im 75/90  soft-tissue]
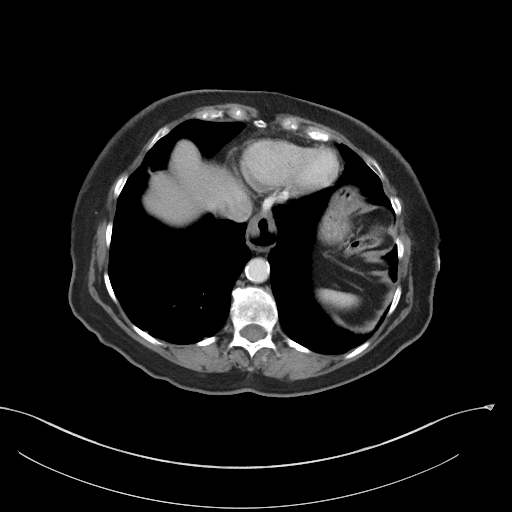
[im 85/90  soft-tissue]
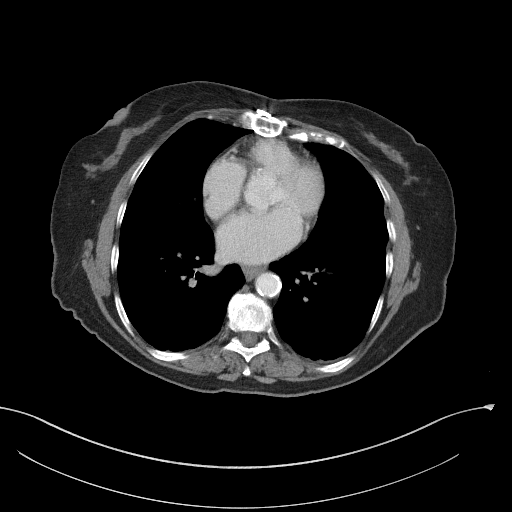

[Series 5: coronal st · coronal · 0.68mm/px · 3 of 78 slices shown]
[im 26/78  soft-tissue]
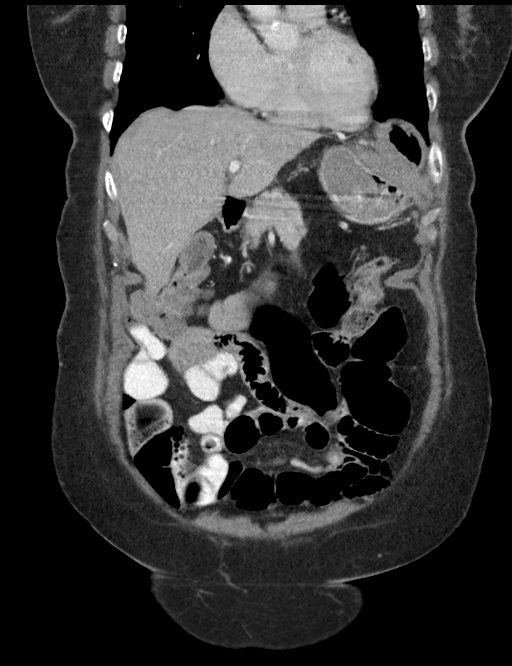
[im 35/78  soft-tissue]
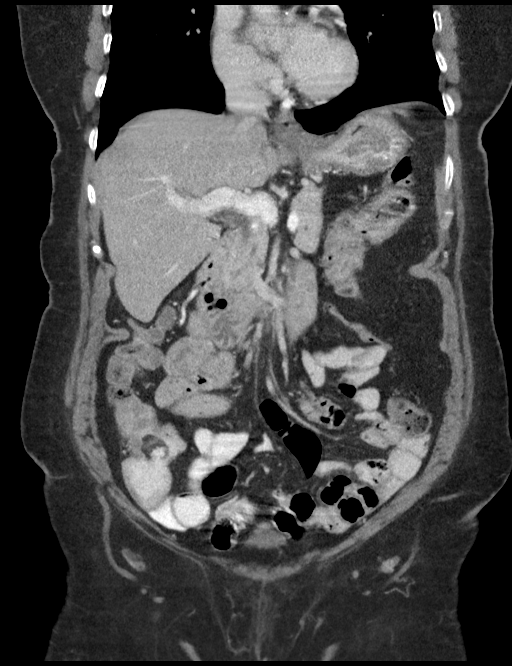
[im 43/78  soft-tissue]
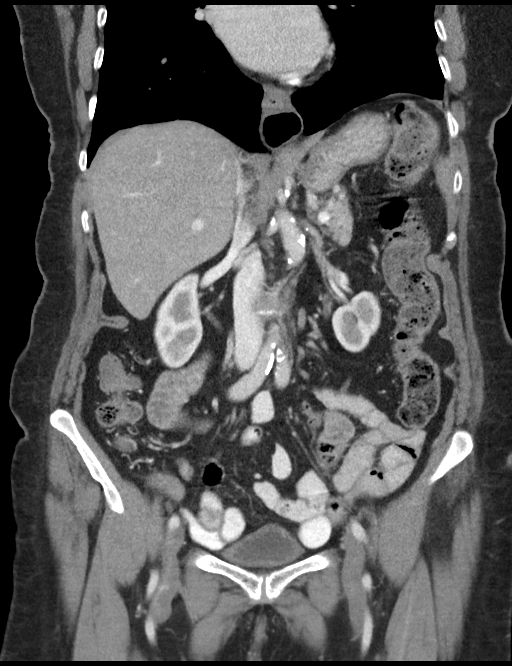

[16 of 46 positions shown; findings below may reference images not displayed]

FINDINGS: Lower chest:  No significant abnormality

Hepatobiliary: Cholecystectomy. The liver and bile ducts are
unremarkable.

Pancreas: Normal

Spleen: Normal

Adrenals/Urinary Tract: The adrenals and kidneys are normal in
appearance. There is no urinary calculus evident. There is no
hydronephrosis or ureteral dilatation. Collecting systems and
ureters appear unremarkable.

Stomach/Bowel: There is a moderate hiatal hernia. The stomach and
small bowel are otherwise unremarkable. There is a large volume
stool in the colon, particularly in the rectum which is distended.
Moderate colonic diverticulosis. No bowel obstruction. No
extraluminal air.

Vascular/Lymphatic: The abdominal aorta is normal in caliber with
moderate atherosclerotic calcification. There is a 13 mm splenic
artery aneurysm, heavily calcified.

Reproductive: Hysterectomy.  No adnexal abnormalities.

Other: No acute inflammatory changes are evident in the abdomen or
pelvis. There is no adenopathy. There is no ascites.

Musculoskeletal: No significant musculoskeletal lesion.
IMPRESSION: 1. Moderate hiatal hernia.
2. Diverticulosis. Distention of the colon with stool and air,
consistent with constipation.

## 2016-10-22 ENCOUNTER — Other Ambulatory Visit: Payer: Self-pay | Admitting: Family Medicine

## 2016-10-23 NOTE — Telephone Encounter (Signed)
Last OV 09/18/16 Tramadol last filled 09/25/16 #60 with 0

## 2016-10-23 NOTE — Telephone Encounter (Signed)
Medication filled to pharmacy as requested.   

## 2016-10-26 ENCOUNTER — Emergency Department (HOSPITAL_BASED_OUTPATIENT_CLINIC_OR_DEPARTMENT_OTHER): Payer: Medicare HMO

## 2016-10-26 ENCOUNTER — Emergency Department (HOSPITAL_BASED_OUTPATIENT_CLINIC_OR_DEPARTMENT_OTHER)
Admission: EM | Admit: 2016-10-26 | Discharge: 2016-10-26 | Disposition: A | Discharge status: Home / Self Care | Payer: Medicare HMO | Attending: Emergency Medicine | Admitting: Emergency Medicine

## 2016-10-26 ENCOUNTER — Encounter (HOSPITAL_BASED_OUTPATIENT_CLINIC_OR_DEPARTMENT_OTHER): Payer: Self-pay | Admitting: *Deleted

## 2016-10-26 DIAGNOSIS — E119 Type 2 diabetes mellitus without complications: Secondary | ICD-10-CM | POA: Insufficient documentation

## 2016-10-26 DIAGNOSIS — G8929 Other chronic pain: Secondary | ICD-10-CM | POA: Diagnosis not present

## 2016-10-26 DIAGNOSIS — Z7982 Long term (current) use of aspirin: Secondary | ICD-10-CM | POA: Diagnosis not present

## 2016-10-26 DIAGNOSIS — Z87891 Personal history of nicotine dependence: Secondary | ICD-10-CM | POA: Insufficient documentation

## 2016-10-26 DIAGNOSIS — M25512 Pain in left shoulder: Secondary | ICD-10-CM | POA: Diagnosis not present

## 2016-10-26 DIAGNOSIS — I10 Essential (primary) hypertension: Secondary | ICD-10-CM | POA: Diagnosis not present

## 2016-10-26 DIAGNOSIS — I2510 Atherosclerotic heart disease of native coronary artery without angina pectoris: Secondary | ICD-10-CM | POA: Insufficient documentation

## 2016-10-26 DIAGNOSIS — R0789 Other chest pain: Secondary | ICD-10-CM

## 2016-10-26 LAB — COMPREHENSIVE METABOLIC PANEL
ALBUMIN: 3.8 g/dL (ref 3.5–5.0)
ALT: 12 U/L — ABNORMAL LOW (ref 14–54)
ANION GAP: 10 (ref 5–15)
AST: 20 U/L (ref 15–41)
Alkaline Phosphatase: 83 U/L (ref 38–126)
BUN: 24 mg/dL — ABNORMAL HIGH (ref 6–20)
CALCIUM: 9.4 mg/dL (ref 8.9–10.3)
CHLORIDE: 103 mmol/L (ref 101–111)
CO2: 26 mmol/L (ref 22–32)
Creatinine, Ser: 1.18 mg/dL — ABNORMAL HIGH (ref 0.44–1.00)
GFR calc non Af Amer: 41 mL/min — ABNORMAL LOW (ref >60)
GFR, EST AFRICAN AMERICAN: 47 mL/min — AB (ref >60)
GLUCOSE: 93 mg/dL (ref 65–99)
POTASSIUM: 3.3 mmol/L — AB (ref 3.5–5.1)
SODIUM: 139 mmol/L (ref 135–145)
Total Bilirubin: 1 mg/dL (ref 0.3–1.2)
Total Protein: 6.8 g/dL (ref 6.5–8.1)

## 2016-10-26 LAB — CBC WITH DIFFERENTIAL/PLATELET
BASOS ABS: 0 10*3/uL (ref 0.0–0.1)
BASOS PCT: 1 %
Eosinophils Absolute: 0.2 10*3/uL (ref 0.0–0.7)
Eosinophils Relative: 4 %
HEMATOCRIT: 38.7 % (ref 36.0–46.0)
HEMOGLOBIN: 12.5 g/dL (ref 12.0–15.0)
LYMPHS PCT: 28 %
Lymphs Abs: 1 10*3/uL (ref 0.7–4.0)
MCH: 29.9 pg (ref 26.0–34.0)
MCHC: 32.3 g/dL (ref 30.0–36.0)
MCV: 92.6 fL (ref 78.0–100.0)
MONO ABS: 0.4 10*3/uL (ref 0.1–1.0)
Monocytes Relative: 10 %
NEUTROS ABS: 2.1 10*3/uL (ref 1.7–7.7)
NEUTROS PCT: 57 %
Platelets: 179 10*3/uL (ref 150–400)
RBC: 4.18 MIL/uL (ref 3.87–5.11)
RDW: 13.3 % (ref 11.5–15.5)
WBC: 3.6 10*3/uL — AB (ref 4.0–10.5)

## 2016-10-26 LAB — LIPASE, BLOOD: Lipase: 29 U/L (ref 11–51)

## 2016-10-26 LAB — TROPONIN I

## 2016-10-26 MED ORDER — GI COCKTAIL ~~LOC~~
30.0000 mL | Freq: Once | ORAL | Status: AC
  Administered 2016-10-26: 30 mL via ORAL
  Filled 2016-10-26: qty 30

## 2016-10-26 NOTE — ED Triage Notes (Signed)
Chest pain x 2 days. Her daughter made her come today. Pain in her upper abdomen.

## 2016-10-26 NOTE — ED Provider Notes (Signed)
[facility] DEPT Provider Note   CSN: [id number] Arrival date & time: 10/26/16  1224     History   Chief Complaint Chief Complaint  Patient presents with  . Chest Pain    HPI Wanda Wright is a 83 y.o. female with PMH/o Alzheimers, MI, CABG, who presents with substernal CP and epigastric pain. Patient is unsure of when the pain started. Initially thought it may have started 2 days ago, but when asked again patient states "its been there forever." She told her daughter about it this morning, prompting ED visit. She describes it as a constant pressure with intermittent "sharp shock" to her epigastric region and under her left breast. She does not recall any alleviating or aggravating factors. Patient unsure if pain is worsened with exertion. She did not take any nitro for pain but did take her daily ASA. Patient also reports significant pain to left shoulder, which daughter reports is a chronic issue relating to her arthritis. Patient and daughter deny any associated SOB, diaphoresis, nausea, weakness.    Cardiology: Stanford Breed  The history is provided by the patient and a relative. The history is limited by the condition of the patient.    Past Medical History:  Diagnosis Date  . Allergic rhinitis   . Bradycardia    January, 2013  . 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 Carotid artery disease ([place])    Doppler, October, 2011, 0-39% bilateral  . Colon polyp   . Colon polyps   . Diabetes mellitus without complication ([place])   . Ejection fraction    normal, catheterization, 2009  . Hearing loss   . Hemidiaphragm    elevated hemidiaphragm  . Hx of CABG    2004  . Hyperlipidemia   . Hypertension   . Osteoarthritis cervical spine     Patient Active Problem List   Diagnosis Date Noted  . Hand pain, left 03/10/2015  . Cerumen impaction 09/08/2014  . Malaise and  fatigue 10/20/2013  . Shoulder arthritis 10/20/2013  . Sciatica of right side 10/01/2013  . Routine general medical examination at a health care facility 01/03/2013  . Keratosis, seborrheic 03/28/2012  . Breast pain 02/14/2012  . Pectoralis muscle strain 02/14/2012  . Sinusitis, acute 12/20/2011  . Cherry angioma 12/05/2011  . Insomnia 12/05/2011  . Neuropathy of leg 09/05/2011  . Bradycardia   . CAD (coronary artery disease)   . Hyperlipidemia   . Hypertension   . Hemidiaphragm   . Carotid artery disease ([place])   . Hx of CABG   . Ejection fraction   . OM (otitis media) 07/19/2011  . Leg cramps 04/12/2011  . Sweating 04/12/2011  . Flushing 04/12/2011  . Constipation 04/06/2011  . Memory loss 02/10/2011  . OTHER&UNSPECIFIED DISEASES THE ORAL SOFT TISSUES 08/17/2010  . NEOPLASM, SKIN, UNCERTAIN BEHAVIOR 25/12/3974  . GAIT IMBALANCE 08/10/2010  . DYSPHAGIA 08/10/2010  . DEPRESSIVE DISORDER 06/13/2010  . COLONIC POLYPS 11/13/2008  . SEBACEOUS CYST, SCALP 10/05/2008  . Osteoarthritis of spine 10/05/2008  . ALLERGIC RHINITIS 03/14/2007  . Osteoporosis 03/14/2007    Past Surgical History:  Procedure Laterality Date  . BREAST LUMPECTOMY     multiple, all benign  . CATARACT EXTRACTION Bilateral   . CHOLECYSTECTOMY    . COLONOSCOPY W/ POLYPECTOMY    . CORONARY ARTERY BYPASS GRAFT     ACS  . HIATAL HERNIA  REPAIR    . lid lift Bilateral   . TOTAL ABDOMINAL HYSTERECTOMY      OB History    No data available       Home Medications    Prior to Admission medications   Medication Sig Start Date End Date Taking? Authorizing Provider  acetaminophen (TYLENOL) 500 MG tablet Take 1,000 mg by mouth 2 (two) times daily.     Historical Provider, MD  aspirin 81 MG tablet Take 81 mg by mouth daily.      Historical Provider, MD  atorvastatin (LIPITOR) 20 MG tablet TAKE 1 TABLET BY MOUTH AS DIRECTED 09/08/16   Midge Minium, MD  Calcium Carbonate-Vitamin D 600-400 MG-UNIT per  tablet Take 2 tablets by mouth daily.    Historical Provider, MD  chlorthalidone (HYGROTON) 25 MG tablet TAKE AS DIRECTED 05/22/16   Midge Minium, MD  cholecalciferol (VITAMIN D) 1000 UNITS tablet Take 1,000 Units by mouth daily.    Historical Provider, MD  docusate sodium (COLACE) 100 MG capsule TAKE ONE CAPSULE BY MOUTH EVERY 12 HOURS 09/18/16   Midge Minium, MD  donepezil (ARICEPT) 10 MG tablet TAKE 1 TABLET (10 MG TOTAL) BY MOUTH AT BEDTIME. 06/12/16   Midge Minium, MD  fluticasone (FLONASE) 50 MCG/ACT nasal spray Place 2 sprays into both nostrils daily. 05/25/14   Midge Minium, MD  gabapentin (NEURONTIN) 100 MG capsule TAKE 1 CAPSULE BY MOUTH 3 TIMES DAILY AS NEEDED. Patient taking differently: Take 100 mg by mouth 2 (two) times daily.  11/10/15   Midge Minium, MD  KLOR-CON 10 10 MEQ tablet TAKE 1 TABLET BY MOUTH 2 TIMES DAILY. 10/09/16   Midge Minium, MD  loratadine (CLARITIN) 10 MG tablet Take 10 mg by mouth daily.    Historical Provider, MD  nitroGLYCERIN (NITROSTAT) 0.4 MG SL tablet Place 1 tablet (0.4 mg total) under the tongue every 5 (five) minutes as needed for chest pain. 09/10/15   Lelon Perla, MD  polyethylene glycol (MIRALAX / GLYCOLAX) packet Take 17 g by mouth every other day.     Historical Provider, MD  psyllium (METAMUCIL) 58.6 % powder Take 1 packet by mouth as needed.    Historical Provider, MD  traMADol (ULTRAM) 50 MG tablet TAKE 1 TABLET BY MOUTH EVERY 6 HOURS AS NEEDED FOR PAIN 10/23/16   Midge Minium, MD  triamcinolone ointment (KENALOG) 0.1 % Apply 1 application topically 2 (two) times daily. 09/18/16 09/18/17  Midge Minium, MD    Family History Family History  Problem Relation Age of Onset  . Dementia Mother   . Breast cancer Mother   . Heart attack Father   . Stroke Father     Social History Social History  Substance Use Topics  . Smoking status: Former Smoker    Types: Cigarettes    Quit date: 08/07/1948  .  Smokeless tobacco: Never Used  . Alcohol use No     Allergies   Codeine; Horse-derived products; and Tetanus toxoid   Review of Systems Review of Systems  Constitutional: Negative for activity change, chills and fever.  HENT: Negative for congestion and rhinorrhea.   Respiratory: Negative for cough and shortness of breath.   Cardiovascular: Positive for chest pain. Negative for leg swelling.  Gastrointestinal: Negative for abdominal pain, diarrhea, nausea and vomiting.  Genitourinary: Negative for dysuria and frequency.  Musculoskeletal: Positive for arthralgias (chronic left shoulder). Negative for neck pain.  Skin: Negative for rash.  Neurological: Negative  for dizziness and weakness.  All other systems reviewed and are negative.    Physical Exam Updated Vital Signs BP (!) 148/86   Pulse (!) 51   Temp 98.1 F (36.7 C) (Oral)   Resp 14   Ht 5\' 3"  (1.6 m)   Wt 70.8 kg   SpO2 96%   BMI 27.63 kg/m   Physical Exam  Constitutional: She appears well-developed and well-nourished. No distress.  Frail, elderly but otherwise well-appearing   HENT:  Head: Normocephalic and atraumatic.  Mouth/Throat: Oropharynx is clear and moist and mucous membranes are normal.  Eyes: Conjunctivae, EOM and lids are normal. Pupils are equal, round, and reactive to light.  Neck: Full passive range of motion without pain.  Cardiovascular: Regular rhythm, normal heart sounds and intact distal pulses.  Bradycardia present.  Exam reveals no gallop and no friction rub.   No murmur heard. Pulmonary/Chest: Effort normal and breath sounds normal. She exhibits no tenderness.  Well healed midline surgical scar to anterior chest wall  Pain reproducible with abduction of LUE  Abdominal: Soft. Normal appearance. There is tenderness in the epigastric area. There is no rigidity and no guarding.  Musculoskeletal: Normal range of motion.  Tenderness to palpation to left shoulder. Moderate pain with abduction  of LUE. Substernal CP reproduced with LUE abduction.   Neurological: She is alert.  Able to converse and answer questions appropriately but shows some confusion when asked for details. Mental status at baseline per daugther.  Skin: Skin is warm and dry. Capillary refill takes less than 2 seconds.  Psychiatric: She has a normal mood and affect. Her speech is normal.  Nursing note and vitals reviewed.    ED Treatments / Results  Labs (all labs ordered are listed, but only abnormal results are displayed) Labs Reviewed  CBC WITH DIFFERENTIAL/PLATELET - Abnormal; Notable for the following:       Result Value   WBC 3.6 (*)    All other components within normal limits  COMPREHENSIVE METABOLIC PANEL - Abnormal; Notable for the following:    Potassium 3.3 (*)    BUN 24 (*)    Creatinine, Ser 1.18 (*)    ALT 12 (*)    GFR calc non Af Amer 41 (*)    GFR calc Af Amer 47 (*)    All other components within normal limits  LIPASE, BLOOD  TROPONIN I    EKG  EKG Interpretation  Date/Time:  Thursday October 26 2016 12:32:54 EDT Ventricular Rate:  53 PR Interval:  160 QRS Duration: 90 QT Interval:  506 QTC Calculation: 474 R Axis:   53 Text Interpretation:  Sinus bradycardia Nonspecific ST and T wave abnormality No significant change since last tracing Confirmed by Maryan Rued  MD, Loree Fee ([id number]) on 10/26/2016 12:42:59 PM       Radiology Dg Chest Portable 1 View  Result Date: 10/26/2016 CLINICAL DATA:  Left upper chest pain since last night. EXAM: PORTABLE CHEST 1 VIEW COMPARISON:  11/15/2015 FINDINGS: Previous median sternotomy and CABG. Heart size is normal. There is aortic atherosclerosis. The lungs are clear. No effusions. Chronic degenerative changes affect the shoulders. IMPRESSION: Previous CABG.  No active disease. Electronically Signed   By: Nelson Chimes M.D.   On: 10/26/2016 13:53    Procedures Procedures (including critical care time)  Medications Ordered in ED Medications    gi cocktail (Maalox,Lidocaine,Donnatal) (30 mLs Oral Given 10/26/16 1340)     Initial Impression / Assessment and Plan / ED Course  I  have reviewed the triage vital signs and the nursing notes.  Pertinent labs & imaging results that were available during my care of the patient were reviewed by me and considered in my medical decision making (see chart for details).    Patient with history of Alzheimer's. Presents with substernal CP, unsure of when it started. Initially said 2 days ago, but then when asked again states "it's been there forever." Patient brought in by daughter when she learned of pain this morning. Patient very poor historian due to baseline Alzheimer's. Consider ACS, though low suspicion given questionable chronic duration of symptoms, physical exam and patients appearance. Will check EKG and cardiac biomakers to eval. Also consider GERD, which patient has a history of and was previously on medication for. Also consider musculoskeletal referred pain from shoulder given exam and history of arthritis in left shoulder. Will also check CXR and basic labs including CBC, CMP to eval for infectious etiology. Will give patient a GI cocktail to help with symptoms.   2:16 PM: Re-eval: patient reports that pain has resolved with GI cocktail. She is still complaining of some pain in her left shoulder, which daughter states is a chronic problem. Overall, patient reports feeling improved. Vitals stable at this time.  Today's EKG with no evidence of ST elevation. Trop negative.   2:36 PM: Labs and imaging reviewed with patient and daughter. At this time, considering her symptoms improved with GI cocktail and she has a history of LUE arthritis, higher suspicion that today's pain was GERD vs musculoskeletal in nature. Dr. Maryan Rued has contacted her cardiologist, Dr. Stanford Breed, who has agreed to see her in clinic early at this week so she has close follow-up. Stable for discharge at this time. Return  precautions discussed. Patient expresses understanding and agreement to plan.      Final Clinical Impressions(s) / ED Diagnoses   Final diagnoses:  Chest wall pain    New Prescriptions Discharge Medication List as of 10/26/2016  2:36 PM       Orson Aloe, [place] 10/27/16 2217    Blanchie Dessert, MD 11/06/16 1749

## 2016-10-26 NOTE — Discharge Instructions (Signed)
Your caregiver has diagnosed you as having chest pain that is nonspecific for one problem. This means that after looking at you and examining you and ordering tests (such as blood work, chest x-rays and EKG), your caregiver does not believe that the problem is serious enough to need watching in the hospital. This judgment is often made after testing shows no acute heart attack. Chest pain comes from many different causes.  Seek immediate medical attention if:  You have severe chest pain, especially if the pain is crushing or pressure-like and spreads to the arms, back, neck, or jaw, or if you have sweating, nausea, shortness of breath. This is an emergency. Don't wait to see if the pain will go away. Get medical help at once. Call 911 immediately. Do not drive herself to the hospital.  Your chest pain gets worse and does not go away with rest.  You have an attack of chest pain lasting longer than usual, despite rest and treatment with the medications your caregiver has prescribed  You awaken from sleep with chest pain or shortness of breath.  You feel faint or dizzy  You have chest pain not typical of your usual pain for which you originally saw your caregiver.  You must have a repeat evaluation within 24 hours for a recheck of your heart.  Please call your doctor this morning to schedule this appointment. If you do not have a family doctor, please see the list of doctors below.

## 2016-10-26 NOTE — ED Notes (Signed)
Dr. Stanford Breed [phone] secty will page him

## 2016-10-27 ENCOUNTER — Telehealth: Payer: Self-pay | Admitting: Family Medicine

## 2016-10-27 NOTE — Telephone Encounter (Signed)
Daughter calling asking  If pt needs to f/u with [facility] from ER visit. Pt is to see Dr. Sonda Rumble next week and was not sure if a visit was needed here too.

## 2016-10-27 NOTE — Telephone Encounter (Signed)
If pt is feeling better, she does not need to follow up.  If she is still symptomatic, I would like to see her

## 2016-10-27 NOTE — Telephone Encounter (Signed)
Patient daughter notified of PCP recommendations and is agreement and expresses an understanding. States that pt is not complaining of any symptoms. Will make a follow up appt if necessary after appointment next week with cards.

## 2016-10-29 NOTE — Progress Notes (Signed)
Cardiology Office Note    Date:  10/30/2016   ID:  Wanda Wright, DOB [birth year], MRN [id number]  PCP:  Annye Asa, MD  Cardiologist: Dr. Stanford Breed  Chief Complaint  Patient presents with  . Follow-up    Recent ED visit.     History of Present Illness:    Wanda Wright is a 83 y.o. female with past medical history of CAD (s/p CABG in 2004, cath in 2009 showing patent LIMA-LAD, patent SVG-RI, patent RCA, occluded LM), carotid artery disease, HTN, HLD, and dementia who presents to the office today for ER follow-up.   She was last seen by Dr. Stanford Breed in 08/2015 and reported doing well from a cardiac perspective at that time, denying any recent chest pain or dyspnea on exertion.   She was recently seen at the [facility] ED on 10/26/2016 for sharp epigastric pain which started earlier that morning. Her pain was reproducible with palpation on examination and relieved with a GI cocktail. EKG showed sinus bradycardia, HR 53, with down-sloping ST along inferior and lateral leads (similar to prior tracings). Lipase was normal and troponin I was negative, therefore she was discharged and informed to follow-up with Cardiology as an outpatient.   In talking with the patient today, she is unaware of why she is here and does not remember her recent ER visit. Her daughter is present with her today and reports the patient has been doing well since her recent episode.  She has been walking 2-3 miles per day and has not experienced any recurrent chest pain or dyspnea with exertion. Her daughter reports she had consumed a king-size chocolate bar the day prior to her recent ER visit and believes reflux played a major factor in her symptoms.   Denies any recent dyspnea with exertion, orthopnea, PND, lower extremity edema, dizziness, or presyncope.    Past Medical History:  Diagnosis Date  . Allergic rhinitis   . Bradycardia    January, 2013  . CAD (coronary artery disease)    a. s/p CABG in 2004 b.  cath in 2009 showing patent LIMA-LAD, patent SVG-RI, patent RCA, occluded LM  . Carotid artery disease ([place])    Doppler, October, 2011, 0-39% bilateral  . Colon polyp   . Colon polyps   . Diabetes mellitus without complication ([place])   . Ejection fraction    normal, catheterization, 2009  . Hearing loss   . Hemidiaphragm    elevated hemidiaphragm  . Hx of CABG    2004  . Hyperlipidemia   . Hypertension   . Osteoarthritis cervical spine     Past Surgical History:  Procedure Laterality Date  . BREAST LUMPECTOMY     multiple, all benign  . CATARACT EXTRACTION Bilateral   . CHOLECYSTECTOMY    . COLONOSCOPY W/ POLYPECTOMY    . CORONARY ARTERY BYPASS GRAFT     ACS  . HIATAL HERNIA REPAIR    . lid lift Bilateral   . TOTAL ABDOMINAL HYSTERECTOMY      Current Medications: Outpatient Medications Prior to Visit  Medication Sig Dispense Refill  . acetaminophen (TYLENOL) 500 MG tablet Take 1,000 mg by mouth 2 (two) times daily.     Marland Kitchen aspirin 81 MG tablet Take 81 mg by mouth daily.      Marland Kitchen atorvastatin (LIPITOR) 20 MG tablet TAKE 1 TABLET BY MOUTH AS DIRECTED 90 tablet 0  . Calcium Carbonate-Vitamin D 600-400 MG-UNIT per tablet Take 2 tablets by mouth daily.    Marland Kitchen  chlorthalidone (HYGROTON) 25 MG tablet TAKE AS DIRECTED 30 tablet 3  . cholecalciferol (VITAMIN D) 1000 UNITS tablet Take 1,000 Units by mouth daily.    Marland Kitchen docusate sodium (COLACE) 100 MG capsule TAKE ONE CAPSULE BY MOUTH EVERY 12 HOURS 60 capsule 4  . donepezil (ARICEPT) 10 MG tablet TAKE 1 TABLET (10 MG TOTAL) BY MOUTH AT BEDTIME. 90 tablet 1  . fluticasone (FLONASE) 50 MCG/ACT nasal spray Place 2 sprays into both nostrils daily. 16 g 6  . gabapentin (NEURONTIN) 100 MG capsule TAKE 1 CAPSULE BY MOUTH 3 TIMES DAILY AS NEEDED. (Patient taking differently: Take 100 mg by mouth 2 (two) times daily. ) 270 capsule 1  . KLOR-CON 10 10 MEQ tablet TAKE 1 TABLET BY MOUTH 2 TIMES DAILY. 60 tablet 6  . loratadine (CLARITIN) 10 MG tablet  Take 10 mg by mouth daily.    . nitroGLYCERIN (NITROSTAT) 0.4 MG SL tablet Place 1 tablet (0.4 mg total) under the tongue every 5 (five) minutes as needed for chest pain. 25 tablet 11  . polyethylene glycol (MIRALAX / GLYCOLAX) packet Take 17 g by mouth every other day.     . traMADol (ULTRAM) 50 MG tablet TAKE 1 TABLET BY MOUTH EVERY 6 HOURS AS NEEDED FOR PAIN 60 tablet 0  . triamcinolone ointment (KENALOG) 0.1 % Apply 1 application topically 2 (two) times daily. 80 g 1  . psyllium (METAMUCIL) 58.6 % powder Take 1 packet by mouth as needed.     No facility-administered medications prior to visit.      Allergies:   Codeine; Horse-derived products; and Tetanus toxoid   Social History   Social History  . Marital status: Married    Spouse name: N/A  . Number of children: N/A  . Years of education: N/A   Occupational History  . retired     office work   Social History Main Topics  . Smoking status: Former Smoker    Types: Cigarettes    Quit date: 08/07/1948  . Smokeless tobacco: Never Used  . Alcohol use No  . Drug use: No  . Sexual activity: Not on file   Other Topics Concern  . Not on file   Social History Narrative   Widowed 2000.     Family History:  The patient's family history includes Breast cancer in her mother; Dementia in her mother; Heart attack in her father; Stroke in her father.   Review of Systems:   Please see the history of present illness.     General:  No chills, fever, night sweats or weight changes.  Cardiovascular:  No dyspnea on exertion, edema, orthopnea, palpitations, paroxysmal nocturnal dyspnea. Positive for chest pain.  Dermatological: No rash, lesions/masses Respiratory: No cough, dyspnea Urologic: No hematuria, dysuria Abdominal:   No nausea, vomiting, diarrhea, bright red blood per rectum, melena, or hematemesis Neurologic:  No visual changes, wkns, changes in mental status. All other systems reviewed and are otherwise negative except as  noted above.   Physical Exam:    VS:  BP (!) 142/76   Pulse (!) 50   Ht 5' 3.5" (1.613 m)   Wt 155 lb (70.3 kg)   SpO2 97%   BMI 27.03 kg/m    General: Well developed, well nourished elderly Caucasian female appearing in no acute distress. Head: Normocephalic, atraumatic, sclera non-icteric, no xanthomas, nares are without discharge.  Neck: No carotid bruits. JVD not elevated.  Lungs: Respirations regular and unlabored, without wheezes or rales.  Heart: Regular  rate and rhythm. No S3 or S4.  No murmur, no rubs, or gallops appreciated. Abdomen: Soft, non-tender, non-distended with normoactive bowel sounds. No hepatomegaly. No rebound/guarding. No obvious abdominal masses. Msk:  Strength and tone appear normal for age. No joint deformities or effusions. Extremities: No clubbing or cyanosis. No edema.  Distal pedal pulses are 2+ bilaterally. Neuro: Alert and oriented X 2 (person, place). Moves all extremities spontaneously. No focal deficits noted. Psych:  Responds to questions appropriately with a normal affect. Skin: No rashes or lesions noted  Wt Readings from Last 3 Encounters:  10/30/16 155 lb (70.3 kg)  10/26/16 156 lb (70.8 kg)  09/18/16 156 lb (70.8 kg)     Studies/Labs Reviewed:   EKG:  EKG is not ordered today. EKG from 10/26/2016 reviewed which showed sinus bradycardia, HR 53, with down-sloping ST along inferior and lateral leads (similar to prior tracings).    Recent Labs: 07/19/2016: TSH 1.36 10/26/2016: ALT 12; BUN 24; Creatinine, Ser 1.18; Hemoglobin 12.5; Platelets 179; Potassium 3.3; Sodium 139   Lipid Panel    Component Value Date/Time   CHOL 177 07/19/2016 1440   TRIG 302.0 (H) 07/19/2016 1440   HDL 51.00 07/19/2016 1440   CHOLHDL 3 07/19/2016 1440   VLDL 60.4 (H) 07/19/2016 1440   LDLCALC 95 11/25/2015 1112   LDLDIRECT 79.0 07/19/2016 1440    Additional studies/ records that were reviewed today include:   Cardiac Catheterization: 2009  FINDINGS:   Aortic pressure 127/57, with a mean of 85, left ventricular  pressure 129/16.   CORONARY ANGIOGRAPHY:  Left mainstem:  The left mainstem is occluded.  There is no antegrade flow into either the LAD or the left circumflex.   Right coronary artery:  The right coronary artery is widely patent  throughout.  The vessel supplies a small RV marginal branch, a moderate-  size PDA branch, and two posterolateral branches.  There are no  significant stenoses in the right coronary artery or the branch vessels.   Left circumflex:  The left circumflex has an anomalous origin.  It is a  small vessel that arises from the right coronary cusp.  There are no  obvious stenoses, but the vessel is very small.   Saphenous vein graft to ramus intermedius is patent.  The intermediate  branch has mild diffuse stenosis.  The ramus is a small vessel.  The  vein graft fills the LAD in retrograde fashion.   LIMA to LAD is widely patent.  The LAD is a moderate-sized vessel that  courses down to the LV apex.  There is a proximal diagonal branch that  fills in retrograde fashion from the LIMA.   Left ventriculography shows normal LV function.  There are no  significant wall motion abnormalities.  The LVEF is 60%.  There is no  mitral regurgitation.   ASSESSMENT:  1. Left mainstem occlusion.  2. Patent saphenous vein graft to ramus intermedius.  3. Patent left internal mammary artery to left anterior descending.  4. Widely patent right coronary artery.  5. 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  6. Normal left ventricular function.   PLAN:  Recommend continued medical therapy for the patient's CAD.  She  has widely patent bypass grafts.  Assessment:    1. Atypical chest pain   2. Coronary artery disease due to lipid rich plaque   3. Essential hypertension   4. Hyperlipidemia, unspecified hyperlipidemia type   5. Dementia without behavioral disturbance, unspecified dementia type  Plan:   In order of problems listed above:  1. Atypical Chest Pain - seen in the ED on 10/26/2016 for sharp epigastric pain which started earlier that morning and was thought to be atypical due to being reproducible with palpation on examination and relieved with a GI cocktail.  - EKG from that day is reviewed and shows sinus bradycardia, HR 53, with down-sloping ST along inferior and lateral leads (similar to prior tracings).  - denies any recurrent episodes of chest pain since this episode. She has been walking 2-3 miles per day and has not experienced any recurrent episodes of chest pain or dyspnea with exertion. - her daughter does not wish for her to be on a daily acid reflux medication due to only having one episode which certainly seems reasonable. Recommended use of PRN TUMS. Would not pursue further ischemic evaluation at this time in the setting of her atypical symptoms and baseline dementia.   2. CAD - s/p CABG in 2004, cath in 2009 showed patent LIMA-LAD, patent SVG-RI, patent RCA, occluded LM. - continue ASA and statin. Not on a BB secondary to history of bradycardia.   3. HTN - BP well-controlled.  - not on any anti-hypertensive medications currently.   4. HLD - Lipid Panel in 07/2016 showed total cholesterol 177, HDL 51, and LDL 79. Continues on Atorvastatin.  - followed by PCP.  5. Dementia - patient A&Ox3. Unable to recall recent ER visit from last week. - resides with her daughter.    Medication Adjustments/Labs and Tests Ordered: Current medicines are reviewed at length with the patient today.  Concerns regarding medicines are outlined above.  Medication changes, Labs and Tests ordered today are listed in the Patient Instructions below. Patient Instructions  Medication Instructions:  Your physician recommends that you continue on your current medications as directed. Please refer to the Current Medication list given to you today.  Labwork: NONE    Testing/Procedures: NONE   Follow-Up: Your physician wants you to follow-up in: [place]. You will receive a reminder letter in the mail two months in advance. If you don't receive a letter, please call our office to schedule the follow-up appointment.  Any Other Special Instructions Will Be Listed Below (If Applicable).  If you need a refill on your cardiac medications before your next appointment, please call your pharmacy.    Arna Medici, [place]  10/30/2016 6:40 PM    [facility] [place], [place], [place]  [address] Phone: [phone]; Fax: [phone]  [address] [place], [place] [address] Phone: [phone]

## 2016-10-30 ENCOUNTER — Ambulatory Visit (INDEPENDENT_AMBULATORY_CARE_PROVIDER_SITE_OTHER): Payer: Medicare HMO | Admitting: Student

## 2016-10-30 ENCOUNTER — Encounter: Payer: Self-pay | Admitting: Student

## 2016-10-30 VITALS — BP 142/76 | HR 50 | Ht 63.5 in | Wt 155.0 lb

## 2016-10-30 DIAGNOSIS — I2510 Atherosclerotic heart disease of native coronary artery without angina pectoris: Secondary | ICD-10-CM

## 2016-10-30 DIAGNOSIS — I2583 Coronary atherosclerosis due to lipid rich plaque: Secondary | ICD-10-CM | POA: Diagnosis not present

## 2016-10-30 DIAGNOSIS — F0390 Unspecified dementia without behavioral disturbance: Secondary | ICD-10-CM | POA: Insufficient documentation

## 2016-10-30 DIAGNOSIS — I10 Essential (primary) hypertension: Secondary | ICD-10-CM

## 2016-10-30 DIAGNOSIS — R0789 Other chest pain: Secondary | ICD-10-CM | POA: Diagnosis not present

## 2016-10-30 DIAGNOSIS — E785 Hyperlipidemia, unspecified: Secondary | ICD-10-CM

## 2016-10-30 NOTE — Patient Instructions (Addendum)
Medication Instructions:  Your physician recommends that you continue on your current medications as directed. Please refer to the Current Medication list given to you today.   Labwork: NONE  Testing/Procedures: NONE  Follow-Up: Your physician wants you to follow-up in: 12 MONTHS WITH DR. CRENSHAW. You will receive a reminder letter in the mail two months in advance. If you don't receive a letter, please call our office to schedule the follow-up appointment.   Any Other Special Instructions Will Be Listed Below (If Applicable).     If you need a refill on your cardiac medications before your next appointment, please call your pharmacy.   

## 2016-11-01 DIAGNOSIS — H353132 Nonexudative age-related macular degeneration, bilateral, intermediate dry stage: Secondary | ICD-10-CM | POA: Diagnosis not present

## 2016-11-01 DIAGNOSIS — H35372 Puckering of macula, left eye: Secondary | ICD-10-CM | POA: Diagnosis not present

## 2016-11-12 ENCOUNTER — Other Ambulatory Visit: Payer: Self-pay | Admitting: Family Medicine

## 2016-11-26 ENCOUNTER — Other Ambulatory Visit: Payer: Self-pay | Admitting: Family Medicine

## 2016-11-27 NOTE — Telephone Encounter (Signed)
Last OV 10/30/16 Tramadol last filled 10/23/16 #60 with 0

## 2016-12-03 ENCOUNTER — Other Ambulatory Visit: Payer: Self-pay | Admitting: Family Medicine

## 2016-12-04 ENCOUNTER — Other Ambulatory Visit: Payer: Self-pay | Admitting: Family Medicine

## 2016-12-24 ENCOUNTER — Other Ambulatory Visit: Payer: Self-pay | Admitting: Family Medicine

## 2016-12-25 NOTE — Telephone Encounter (Signed)
Medication filled to pharmacy as requested.   

## 2016-12-25 NOTE — Telephone Encounter (Signed)
Last OV 09/18/16 Tramadol last filled 11/27/16 #60 with 0

## 2017-01-03 DIAGNOSIS — K600 Acute anal fissure: Secondary | ICD-10-CM | POA: Diagnosis not present

## 2017-01-03 DIAGNOSIS — K6289 Other specified diseases of anus and rectum: Secondary | ICD-10-CM | POA: Diagnosis not present

## 2017-01-03 DIAGNOSIS — K5900 Constipation, unspecified: Secondary | ICD-10-CM | POA: Diagnosis not present

## 2017-01-05 DIAGNOSIS — M19012 Primary osteoarthritis, left shoulder: Secondary | ICD-10-CM | POA: Diagnosis not present

## 2017-01-17 ENCOUNTER — Encounter: Payer: Self-pay | Admitting: Family Medicine

## 2017-01-17 ENCOUNTER — Ambulatory Visit (INDEPENDENT_AMBULATORY_CARE_PROVIDER_SITE_OTHER): Payer: Medicare HMO | Admitting: Family Medicine

## 2017-01-17 VITALS — BP 130/70 | HR 57 | Temp 97.7°F | Resp 18 | Ht 63.5 in | Wt 158.0 lb

## 2017-01-17 DIAGNOSIS — I10 Essential (primary) hypertension: Secondary | ICD-10-CM

## 2017-01-17 DIAGNOSIS — E785 Hyperlipidemia, unspecified: Secondary | ICD-10-CM

## 2017-01-17 LAB — CBC WITH DIFFERENTIAL/PLATELET
BASOS PCT: 0.5 % (ref 0.0–3.0)
Basophils Absolute: 0 10*3/uL (ref 0.0–0.1)
EOS ABS: 0.2 10*3/uL (ref 0.0–0.7)
Eosinophils Relative: 3 % (ref 0.0–5.0)
HCT: 39.1 % (ref 36.0–46.0)
Hemoglobin: 12.8 g/dL (ref 12.0–15.0)
Lymphocytes Relative: 12.6 % (ref 12.0–46.0)
Lymphs Abs: 0.9 10*3/uL (ref 0.7–4.0)
MCHC: 32.9 g/dL (ref 30.0–36.0)
MCV: 91.9 fl (ref 78.0–100.0)
MONO ABS: 0.4 10*3/uL (ref 0.1–1.0)
Monocytes Relative: 6 % (ref 3.0–12.0)
NEUTROS ABS: 5.6 10*3/uL (ref 1.4–7.7)
Neutrophils Relative %: 77.9 % — ABNORMAL HIGH (ref 43.0–77.0)
PLATELETS: 185 10*3/uL (ref 150.0–400.0)
RBC: 4.26 Mil/uL (ref 3.87–5.11)
RDW: 13.7 % (ref 11.5–15.5)
WBC: 7.3 10*3/uL (ref 4.0–10.5)

## 2017-01-17 LAB — LIPID PANEL
Cholesterol: 167 mg/dL (ref 0–200)
HDL: 54.3 mg/dL (ref >39.00)
LDL Cholesterol: 81 mg/dL (ref 0–99)
NONHDL: 113.15
Total CHOL/HDL Ratio: 3
Triglycerides: 160 mg/dL — ABNORMAL HIGH (ref 0.0–149.0)
VLDL: 32 mg/dL (ref 0.0–40.0)

## 2017-01-17 LAB — BASIC METABOLIC PANEL
BUN: 20 mg/dL (ref 6–23)
CHLORIDE: 105 meq/L (ref 96–112)
CO2: 28 meq/L (ref 19–32)
Calcium: 9.2 mg/dL (ref 8.4–10.5)
Creatinine, Ser: 0.98 mg/dL (ref 0.40–1.20)
GFR: 57.21 mL/min — ABNORMAL LOW (ref >60.00)
Glucose, Bld: 120 mg/dL — ABNORMAL HIGH (ref 70–99)
POTASSIUM: 4 meq/L (ref 3.5–5.1)
SODIUM: 142 meq/L (ref 135–145)

## 2017-01-17 LAB — HEPATIC FUNCTION PANEL
ALK PHOS: 97 U/L (ref 39–117)
ALT: 12 U/L (ref 0–35)
AST: 16 U/L (ref 0–37)
Albumin: 3.9 g/dL (ref 3.5–5.2)
BILIRUBIN DIRECT: 0.1 mg/dL (ref 0.0–0.3)
TOTAL PROTEIN: 6.4 g/dL (ref 6.0–8.3)
Total Bilirubin: 0.6 mg/dL (ref 0.2–1.2)

## 2017-01-17 LAB — TSH: TSH: 1.2 u[IU]/mL (ref 0.35–4.50)

## 2017-01-17 NOTE — Assessment & Plan Note (Signed)
chronic problem.  Well controlled today.  Asymptomatic.  Check labs.  No anticipated med changes.  Will follow

## 2017-01-17 NOTE — Assessment & Plan Note (Signed)
Chronic problem.  Tolerating statin w/o difficulty.  Check labs.  Adjust meds prn  

## 2017-01-17 NOTE — Patient Instructions (Signed)
Schedule your complete physical and Annual Medicare Wellness visit in 6 months We'll notify you of your lab results and make any changes if needed Keep up the good work!  You look great! Call with any questions or concerns Have a great summer!!!

## 2017-01-17 NOTE — Progress Notes (Signed)
Pre visit review using our clinic review tool, if applicable. No additional management support is needed unless otherwise documented below in the visit note. 

## 2017-01-17 NOTE — Progress Notes (Signed)
   Subjective:    Patient ID: Wanda Wright, female    DOB: 08/31/1928, 83 y.o.   MRN: [id number]  HPI Hyperlipidemia- chronic problem, on Lipitor 20mg  daily.  No abd pain, N/V, myalgias.    HTN- chronic problem, on Chlorthalidone 25mg  daily w/ good control.  No CP, SOB, HAs, visual changes, edema.   Review of Systems For ROS see HPI     Objective:   Physical Exam  Constitutional: She appears well-developed and well-nourished. No distress.  HENT:  Head: Normocephalic and atraumatic.  Eyes: Conjunctivae and EOM are normal. Pupils are equal, round, and reactive to light.  Neck: Normal range of motion. Neck supple. No thyromegaly present.  Cardiovascular: Normal rate, regular rhythm, normal heart sounds and intact distal pulses.   No murmur heard. Pulmonary/Chest: Effort normal and breath sounds normal. No respiratory distress.  Abdominal: Soft. She exhibits no distension. There is no tenderness.  Musculoskeletal: She exhibits no edema.  Lymphadenopathy:    She has no cervical adenopathy.  Neurological: She is alert.  Skin: Skin is warm and dry.  Psychiatric: She has a normal mood and affect. Her behavior is normal.  Vitals reviewed.         Assessment & Plan:

## 2017-01-19 ENCOUNTER — Other Ambulatory Visit (INDEPENDENT_AMBULATORY_CARE_PROVIDER_SITE_OTHER): Payer: Medicare HMO

## 2017-01-19 DIAGNOSIS — R7309 Other abnormal glucose: Secondary | ICD-10-CM | POA: Diagnosis not present

## 2017-01-19 LAB — HEMOGLOBIN A1C: HEMOGLOBIN A1C: 6.1 % (ref 4.6–6.5)

## 2017-01-28 ENCOUNTER — Other Ambulatory Visit: Payer: Self-pay | Admitting: Family Medicine

## 2017-01-29 NOTE — Telephone Encounter (Signed)
Last OV 01/17/17 Tramadol last filled 12/25/16 #60 with 0

## 2017-01-29 NOTE — Telephone Encounter (Signed)
Medication filled to pharmacy as requested.   

## 2017-01-31 DIAGNOSIS — K600 Acute anal fissure: Secondary | ICD-10-CM | POA: Diagnosis not present

## 2017-01-31 DIAGNOSIS — K5900 Constipation, unspecified: Secondary | ICD-10-CM | POA: Diagnosis not present

## 2017-02-04 ENCOUNTER — Other Ambulatory Visit: Payer: Self-pay | Admitting: Family Medicine

## 2017-02-24 ENCOUNTER — Other Ambulatory Visit: Payer: Self-pay | Admitting: Family Medicine

## 2017-02-26 NOTE — Telephone Encounter (Signed)
Medication filled to pharmacy as requested.   

## 2017-02-26 NOTE — Telephone Encounter (Signed)
Last OV 01/17/17 Tramadol last filled 01/29/17 #60 with 0

## 2017-03-25 ENCOUNTER — Other Ambulatory Visit: Payer: Self-pay | Admitting: Family Medicine

## 2017-03-26 NOTE — Telephone Encounter (Signed)
Medication filled to pharmacy as requested.   

## 2017-03-26 NOTE — Telephone Encounter (Signed)
Last OV 01/17/17 Tramadol last filled 02/26/17 #60 with 0

## 2017-04-11 DIAGNOSIS — M19012 Primary osteoarthritis, left shoulder: Secondary | ICD-10-CM | POA: Diagnosis not present

## 2017-04-29 ENCOUNTER — Other Ambulatory Visit: Payer: Self-pay | Admitting: Family Medicine

## 2017-04-30 NOTE — Telephone Encounter (Signed)
Last OV 01/17/17 Tramadol last filled 03/26/17 #60 with 0

## 2017-04-30 NOTE — Telephone Encounter (Signed)
Medication filled to pharmacy as requested.   

## 2017-05-03 DIAGNOSIS — H43813 Vitreous degeneration, bilateral: Secondary | ICD-10-CM | POA: Diagnosis not present

## 2017-05-03 DIAGNOSIS — H353131 Nonexudative age-related macular degeneration, bilateral, early dry stage: Secondary | ICD-10-CM | POA: Diagnosis not present

## 2017-05-08 ENCOUNTER — Other Ambulatory Visit: Payer: Self-pay | Admitting: Family Medicine

## 2017-05-20 ENCOUNTER — Other Ambulatory Visit: Payer: Self-pay | Admitting: Family Medicine

## 2017-05-27 ENCOUNTER — Other Ambulatory Visit: Payer: Self-pay | Admitting: Family Medicine

## 2017-05-28 NOTE — Telephone Encounter (Signed)
Last OV 01/17/17 Tramadol last filled 04/30/17 #60 with 0

## 2017-05-28 NOTE — Telephone Encounter (Signed)
Medication filled to pharmacy as requested.   

## 2017-05-31 ENCOUNTER — Other Ambulatory Visit: Payer: Self-pay | Admitting: Family Medicine

## 2017-06-02 ENCOUNTER — Other Ambulatory Visit: Payer: Self-pay | Admitting: Family Medicine

## 2017-06-04 ENCOUNTER — Telehealth: Payer: Self-pay | Admitting: Family Medicine

## 2017-06-04 ENCOUNTER — Other Ambulatory Visit: Payer: Self-pay | Admitting: Family Medicine

## 2017-06-04 NOTE — Telephone Encounter (Signed)
Spoke with pharmacy and gave the verbal okay for medication to be filled.

## 2017-06-04 NOTE — Telephone Encounter (Signed)
Daughter states that pharmacy has not received refill request on klor-con and asked that we resend it to [facility] on [address].

## 2017-06-24 ENCOUNTER — Other Ambulatory Visit: Payer: Self-pay | Admitting: Family Medicine

## 2017-06-25 NOTE — Telephone Encounter (Signed)
Last OV 01/17/17 Tramadol last filled 05/28/17 #60 with 0

## 2017-06-25 NOTE — Telephone Encounter (Signed)
Medication filled to pharmacy as requested.   

## 2017-07-02 ENCOUNTER — Other Ambulatory Visit: Payer: Self-pay | Admitting: Family Medicine

## 2017-07-25 ENCOUNTER — Encounter: Payer: Medicare HMO | Admitting: Family Medicine

## 2017-07-29 ENCOUNTER — Other Ambulatory Visit: Payer: Self-pay | Admitting: Family Medicine

## 2017-07-30 ENCOUNTER — Other Ambulatory Visit: Payer: Self-pay | Admitting: General Practice

## 2017-07-30 MED ORDER — GABAPENTIN 100 MG PO CAPS: ORAL_CAPSULE | ORAL | 1 refills | Status: DC

## 2017-07-30 NOTE — Telephone Encounter (Signed)
Medication filled to pharmacy as requested.   

## 2017-07-30 NOTE — Telephone Encounter (Signed)
Last OV 01/17/17 Tramadol last filled 06/25/17 #60 with 0

## 2017-08-26 ENCOUNTER — Other Ambulatory Visit: Payer: Self-pay | Admitting: Family Medicine

## 2017-08-27 ENCOUNTER — Other Ambulatory Visit: Payer: Self-pay | Admitting: General Practice

## 2017-08-27 MED ORDER — CHLORTHALIDONE 25 MG PO TABS: ORAL_TABLET | ORAL | 3 refills | Status: DC

## 2017-08-27 NOTE — Telephone Encounter (Signed)
Last OV 01/17/17 Tramadol last filled 07/30/17 #60 with 0

## 2017-08-27 NOTE — Telephone Encounter (Signed)
Medication filled to pharmacy as requested.   

## 2017-08-29 DIAGNOSIS — M19012 Primary osteoarthritis, left shoulder: Secondary | ICD-10-CM | POA: Diagnosis not present

## 2017-09-18 NOTE — Progress Notes (Addendum)
Subjective:   Wanda Wright is a 84 y.o. female who presents for Medicare Annual (Subsequent) preventive examination.  Review of Systems:  No ROS.  Medicare Wellness Visit. Additional risk factors are reflected in the social history.  Cardiac Risk Factors include: advanced age (>27men, >20 women);dyslipidemia;hypertension;sedentary lifestyle;family history of premature cardiovascular disease Sleep patterns: Sleeps 10-12 hours. Falls asleep while sitting frequently.  Home Safety/Smoke Alarms: Feels safe in home. Smoke alarms in place.  Living environment; residence and Firearm Safety: Daughter lives with patient in 1 story home [place] Safety/Bike Helmet: Wears seat belt.   Female:   Pap-N/A       Mammo-12/08/2015, dense. Ultrasound, benign. Ordered today.      Dexa scan-09/18/2014, osteopenia.Ordered today.      CCS-colonoscopy 08/11/2004, negative. No recall     Objective:     Vitals: BP 124/68 (BP Location: Left Arm, Patient Position: Sitting, Cuff Size: Normal)   Pulse (!) 59   Temp 97.7 F (36.5 C) (Temporal)   Resp 16   Ht 5\' 4"  (1.626 m)   Wt 152 lb 9.6 oz (69.2 kg)   SpO2 97%   BMI 26.19 kg/m   Body mass index is 26.19 kg/m.  Advanced Directives 09/19/2017 10/26/2016 07/19/2016 11/15/2015 08/10/2015  Does Patient Have a Medical Advance Directive? Yes Yes No;Yes Yes No  Type of Advance Directive Living will;Healthcare Power of [place];Living will [place];Living will [place];Living will -  Copy of [place] in Chart? No - copy requested - No - copy requested - -  Would patient like information on creating a medical advance directive? - - No - Patient declined - -    Tobacco Social History   Tobacco Use  Smoking Status Former Smoker  . Types: Cigarettes  . Last attempt to quit: 08/07/1948  . Years since quitting: 69.1  Smokeless Tobacco Never Used     Counseling given: Not  Answered   Past Medical History:  Diagnosis Date  . Allergic rhinitis   . Bradycardia    January, 2013  . CAD (coronary artery disease)    a. s/p CABG in 2004 b. cath in 2009 showing patent LIMA-LAD, patent SVG-RI, patent RCA, occluded LM  . Carotid artery disease ([place])    Doppler, October, 2011, 0-39% bilateral  . Colon polyp   . Colon polyps   . Diabetes mellitus without complication ([place])   . Ejection fraction    normal, catheterization, 2009  . Hearing loss   . Hemidiaphragm    elevated hemidiaphragm  . Hx of CABG    2004  . Hyperlipidemia   . Hypertension   . Osteoarthritis cervical spine    Past Surgical History:  Procedure Laterality Date  . BREAST LUMPECTOMY     multiple, all benign  . CATARACT EXTRACTION Bilateral   . CHOLECYSTECTOMY    . COLONOSCOPY W/ POLYPECTOMY    . CORONARY ARTERY BYPASS GRAFT     ACS  . HIATAL HERNIA REPAIR    . lid lift Bilateral   . TOTAL ABDOMINAL HYSTERECTOMY     Family History  Problem Relation Age of Onset  . Dementia Mother   . Breast cancer Mother   . Heart attack Father   . Stroke Father    Social History   Socioeconomic History  . Marital status: Married    Spouse name: None  . Number of children: None  . Years of education: None  .  Highest education level: None  Social Needs  . Financial resource strain: None  . Food insecurity - worry: None  . Food insecurity - inability: None  . Transportation needs - medical: None  . Transportation needs - non-medical: None  Occupational History  . Occupation: retired    Comment: office work  Tobacco Use  . Smoking status: Former Smoker    Types: Cigarettes    Last attempt to quit: 08/07/1948    Years since quitting: 69.1  . Smokeless tobacco: Never Used  Substance and Sexual Activity  . Alcohol use: No    Alcohol/week: 0.0 oz  . Drug use: No  . Sexual activity: None  Other Topics Concern  . None  Social History Narrative   Widowed 2000.    Outpatient Encounter  Medications as of 09/19/2017  Medication Sig  . acetaminophen (TYLENOL) 500 MG tablet Take 1,000 mg by mouth 2 (two) times daily.   Marland Kitchen aspirin 81 MG tablet Take 81 mg by mouth daily.    Marland Kitchen atorvastatin (LIPITOR) 20 MG tablet TAKE 1 TABLET BY MOUTH AS DIRECTED  . Calcium Carbonate-Vitamin D 600-400 MG-UNIT per tablet Take 2 tablets by mouth daily.  . chlorthalidone (HYGROTON) 25 MG tablet TAKE AS DIRECTED  . cholecalciferol (VITAMIN D) 1000 UNITS tablet Take 1,000 Units by mouth daily.  Marland Kitchen docusate sodium (COLACE) 100 MG capsule TAKE ONE CAPSULE BY MOUTH EVERY 12 HOURS  . donepezil (ARICEPT) 10 MG tablet TAKE 1 TABLET (10 MG TOTAL) BY MOUTH AT BEDTIME.  . fluticasone (FLONASE) 50 MCG/ACT nasal spray Place 2 sprays into both nostrils daily.  Marland Kitchen gabapentin (NEURONTIN) 100 MG capsule TAKE 1 CAPSULE BY MOUTH 3 TIMES DAILY AS NEEDED.  Marland Kitchen KLOR-CON 10 10 MEQ tablet TAKE 1 TABLET BY MOUTH 2 TIMES DAILY.  Marland Kitchen loratadine (CLARITIN) 10 MG tablet Take 10 mg by mouth daily.  . nitroGLYCERIN (NITROSTAT) 0.4 MG SL tablet Place 1 tablet (0.4 mg total) under the tongue every 5 (five) minutes as needed for chest pain.  . polyethylene glycol (MIRALAX / GLYCOLAX) packet Take 17 g by mouth every other day.   . traMADol (ULTRAM) 50 MG tablet TAKE 1 TABLET BY MOUTH EVERY 6 HOURS AS NEEDED FOR PAIN (Patient taking differently: TAKES BID)  . [EXPIRED] triamcinolone ointment (KENALOG) 0.1 % Apply 1 application topically 2 (two) times daily.  Marland Kitchen Zoster Vaccine Adjuvanted [facility]) injection Inject 0.5 mLs into the muscle once for 1 dose.   No facility-administered encounter medications on file as of 09/19/2017.     Activities of Daily Living In your present state of health, do you have any difficulty performing the following activities: 09/19/2017 09/19/2017  Hearing? N N  Vision? N N  Difficulty concentrating or making decisions? N Y  Walking or climbing stairs? Y Y  Comment - d/t vision  Dressing or bathing? N N  Doing  errands, shopping? Y Y  Conservation officer, nature and eating ? - N  Using the Toilet? - N  In the past six months, have you accidently leaked urine? - N  Do you have problems with loss of bowel control? - N  Managing your Medications? - N  Managing your Finances? - N  Housekeeping or managing your Housekeeping? - N  Some recent data might be hidden    Patient Care Team: Midge Minium, MD as PCP - General Marchia Bond, MD as Consulting Physician (Orthopedic Surgery) Melida Quitter, MD as Consulting Physician (Otolaryngology) Lavonna Monarch, MD as Consulting Physician (Dermatology) [place],  Altha Harm, MD as Consulting Physician (Ophthalmology) Carol Ada, MD as Consulting Physician (Gastroenterology) Stanford Breed Denice Bors, MD as Consulting Physician (Cardiology) Christie Nottingham (Dentistry) Tat, Eustace Quail, DO as Consulting Physician (Neurology)    Assessment:   This is a routine wellness examination for Kinze.  Exercise Activities and Dietary recommendations Current Exercise Habits: The patient does not participate in regular exercise at present, Exercise limited by: None identified Diet (meal preparation, eat out, water intake, caffeinated beverages, dairy products, fruits and vegetables): Drinks water, sweet tea and diet soda.   Breakfast: Protein bar Late Lunch: eats out; meat and vegetables.       Goals    . Patient Stated     Maintain current health       Fall Risk Fall Risk  09/19/2017 09/19/2017 01/17/2017 07/19/2016 05/27/2015  Falls in the past year? No No No No No    Depression Screen PHQ 2/9 Scores 09/19/2017 09/19/2017 07/19/2016 05/27/2015  PHQ - 2 Score 0 0 0 0  PHQ- 9 Score 0 - 0 -     Cognitive Function MMSE - Mini Mental State Exam 09/19/2017 07/19/2016  Not completed: Unable to complete Unable to complete   [facility] Cognitive Assessment  12/29/2015  Visuospatial/ Executive (0/5) 1  Naming (0/3) 0  Attention: Read list of digits (0/2) 2  Attention: Read list of  letters (0/1) 1  Attention: Serial 7 subtraction starting at 100 (0/3) 1  Language: Repeat phrase (0/2) 2  Language : Fluency (0/1) 1  Abstraction (0/2) 1  Delayed Recall (0/5) 0  Orientation (0/6) 2  Total 11  Adjusted Score (based on education) 12      Immunization History  Administered Date(s) Administered  . H1N1 07/14/2008  . Influenza Split 05/11/2011  . Influenza Whole 08/08/2003, 06/05/2007, 04/29/2008, 05/03/2009, 06/13/2010  . Influenza, High Dose Seasonal PF 06/06/2013  . Influenza,inj,Quad PF,6+ Mos 04/10/2014, 05/27/2015, 06/07/2016  . Influenza-Unspecified 05/20/2017  . Pneumococcal Conjugate-13 04/10/2014  . Pneumococcal Polysaccharide-23 08/07/2004, 07/19/2016  . Td 08/07/1998, 05/03/2009  . Zoster 04/29/2008     Screening Tests Health Maintenance  Topic Date Due  . MAMMOGRAM  09/19/2018 (Originally 12/07/2016)  . INFLUENZA VACCINE  Completed  . DEXA SCAN  Completed  . PNA vac Low Risk Adult  Completed       Plan:     Shingles vaccine at pharmacy.  Schedule mammogram and bone scan.   Bring a copy of your living will and/or healthcare power of attorney to your next office visit.  I have personally reviewed and noted the following in the patient's chart:   . Medical and social history . Use of alcohol, tobacco or illicit drugs  . Current medications and supplements . Functional ability and status . Nutritional status . Physical activity . Advanced directives . List of other physicians . Hospitalizations, surgeries, and ER visits in previous 12 months . Vitals . Screenings to include cognitive, depression, and falls . Referrals and appointments  In addition, I have reviewed and discussed with patient certain preventive protocols, quality metrics, and best practice recommendations. A written personalized care plan for preventive services as well as general preventive health recommendations were provided to patient.     Gerilyn Nestle,  RN  09/19/2017  Reviewed documentation provided by RN and agree w/ above.  Annye Asa, MD

## 2017-09-19 ENCOUNTER — Other Ambulatory Visit: Payer: Self-pay

## 2017-09-19 ENCOUNTER — Ambulatory Visit (INDEPENDENT_AMBULATORY_CARE_PROVIDER_SITE_OTHER): Payer: Medicare HMO

## 2017-09-19 ENCOUNTER — Encounter: Payer: Self-pay | Admitting: Family Medicine

## 2017-09-19 ENCOUNTER — Ambulatory Visit (INDEPENDENT_AMBULATORY_CARE_PROVIDER_SITE_OTHER): Payer: Medicare HMO | Admitting: Family Medicine

## 2017-09-19 VITALS — BP 124/68 | HR 59 | Temp 97.7°F | Resp 16 | Ht 64.0 in | Wt 152.4 lb

## 2017-09-19 VITALS — BP 124/68 | HR 59 | Temp 97.7°F | Resp 16 | Ht 64.0 in | Wt 152.6 lb

## 2017-09-19 DIAGNOSIS — Z1231 Encounter for screening mammogram for malignant neoplasm of breast: Secondary | ICD-10-CM

## 2017-09-19 DIAGNOSIS — F0390 Unspecified dementia without behavioral disturbance: Secondary | ICD-10-CM

## 2017-09-19 DIAGNOSIS — M810 Age-related osteoporosis without current pathological fracture: Secondary | ICD-10-CM

## 2017-09-19 DIAGNOSIS — Z0000 Encounter for general adult medical examination without abnormal findings: Secondary | ICD-10-CM

## 2017-09-19 DIAGNOSIS — Z23 Encounter for immunization: Secondary | ICD-10-CM

## 2017-09-19 DIAGNOSIS — E785 Hyperlipidemia, unspecified: Secondary | ICD-10-CM

## 2017-09-19 DIAGNOSIS — Z1239 Encounter for other screening for malignant neoplasm of breast: Secondary | ICD-10-CM

## 2017-09-19 DIAGNOSIS — E2839 Other primary ovarian failure: Secondary | ICD-10-CM

## 2017-09-19 MED ORDER — ZOSTER VAC RECOMB ADJUVANTED 50 MCG/0.5ML IM SUSR: 0.5000 mL | Freq: Once | INTRAMUSCULAR | 1 refills | Status: AC

## 2017-09-19 NOTE — Assessment & Plan Note (Signed)
Pt's PE unchanged from previous.  mammo and DEXA ordered at [facility].  Check labs.  Anticipatory guidance provided.

## 2017-09-19 NOTE — Progress Notes (Signed)
   Subjective:    Patient ID: Wanda Wright, female    DOB: 1929/05/25, 84 y.o.   MRN: [id number]  HPI CPE- mammo and dexa ordered at [facility].  UTD on pneumonia and flu vaccines.  No need for additional colon cancer screening.   Review of Systems  VERY DIFFICULT TO OBTAIN DUE TO DEMENTIA- not sure how accurate responses are  Patient reports no vision/ hearing changes, adenopathy,fever, weight change,  persistant/recurrent hoarseness , swallowing issues, chest pain, palpitations, edema, persistant/recurrent cough, hemoptysis, dyspnea (rest/exertional/paroxysmal nocturnal), gastrointestinal bleeding (melena, rectal bleeding), abdominal pain, significant heartburn, bowel changes, GU symptoms (dysuria, hematuria, incontinence), Gyn symptoms (abnormal  bleeding, pain),  syncope, focal weakness, memory loss, numbness & tingling, skin/hair/nail changes, abnormal bruising or bleeding, anxiety, or depression.   Ingrown nail on L foot    Objective:   Physical Exam General Appearance:    Alert, cooperative, no distress, appears stated age  Head:    Normocephalic, without obvious abnormality, atraumatic  Eyes:    PERRL, conjunctiva/corneas clear, EOM's intact, fundi    benign, both eyes  Ears:    Normal TM's and external ear canals, both ears  Nose:   Nares normal, septum midline, mucosa normal, no drainage    or sinus tenderness  Throat:   Lips, mucosa, and tongue normal; teeth and gums normal  Neck:   Supple, symmetrical, trachea midline, no adenopathy;    Thyroid: no enlargement/tenderness/nodules  Back:     Symmetric, no curvature, ROM normal, no CVA tenderness  Lungs:     Clear to auscultation bilaterally, respirations unlabored  Chest Wall:    No tenderness or deformity   Heart:    Regular rate and rhythm, S1 and S2 normal, no murmur, rub   or gallop  Breast Exam:    Deferred to mammo  Abdomen:     Soft, non-tender, bowel sounds active all four quadrants,    no masses, no organomegaly    Genitalia:    Deferred  Rectal:    Extremities:   Extremities normal, atraumatic, no cyanosis or edema  Pulses:   2+ and symmetric all extremities  Skin:   Skin color, texture, turgor normal, no rashes or lesions, L great toenail mildly ingrown but no evidence of infxn  Lymph nodes:   Cervical, supraclavicular, and axillary nodes normal  Neurologic:   CNII-XII intact, normal strength, sensation and reflexes    throughout          Assessment & Plan:

## 2017-09-19 NOTE — Patient Instructions (Addendum)
Shingles vaccine at pharmacy.  Schedule mammogram and bone scan.   Bring a copy of your living will and/or healthcare power of attorney to your next office visit.  Health Maintenance, Female Adopting a healthy lifestyle and getting preventive care can go a long way to promote health and wellness. Talk with your health care provider about what schedule of regular examinations is right for you. This is a good chance for you to check in with your provider about disease prevention and staying healthy. In between checkups, there are plenty of things you can do on your own. Experts have done a lot of research about which lifestyle changes and preventive measures are most likely to keep you healthy. Ask your health care provider for more information. Weight and diet Eat a healthy diet  Be sure to include plenty of vegetables, fruits, low-fat dairy products, and lean protein.  Do not eat a lot of foods high in solid fats, added sugars, or salt.  Get regular exercise. This is one of the most important things you can do for your health. ? Most adults should exercise for at least 150 minutes each week. The exercise should increase your heart rate and make you sweat (moderate-intensity exercise). ? Most adults should also do strengthening exercises at least twice a week. This is in addition to the moderate-intensity exercise.  Maintain a healthy weight  Body mass index (BMI) is a measurement that can be used to identify possible weight problems. It estimates body fat based on height and weight. Your health care provider can help determine your BMI and help you achieve or maintain a healthy weight.  For females 61 years of age and older: ? A BMI below 18.5 is considered underweight. ? A BMI of 18.5 to 24.9 is normal. ? A BMI of 25 to 29.9 is considered overweight. ? A BMI of 30 and above is considered obese.  Watch levels of cholesterol and blood lipids  You should start having your blood tested  for lipids and cholesterol at 82 years of age, then have this test every 5 years.  You may need to have your cholesterol levels checked more often if: ? Your lipid or cholesterol levels are high. ? You are older than 82 years of age. ? You are at high risk for heart disease.  Cancer screening Lung Cancer  Lung cancer screening is recommended for adults 43-26 years old who are at high risk for lung cancer because of a history of smoking.  A yearly low-dose CT scan of the lungs is recommended for people who: ? Currently smoke. ? Have quit within the past 15 years. ? Have at least a 30-pack-year history of smoking. A pack year is smoking an average of one pack of cigarettes a day for 1 year.  Yearly screening should continue until it has been 15 years since you quit.  Yearly screening should stop if you develop a health problem that would prevent you from having lung cancer treatment.  Breast Cancer  Practice breast self-awareness. This means understanding how your breasts normally appear and feel.  It also means doing regular breast self-exams. Let your health care provider know about any changes, no matter how small.  If you are in your 20s or 30s, you should have a clinical breast exam (CBE) by a health care provider every 1-3 years as part of a regular health exam.  If you are 72 or older, have a CBE every year. Also consider having a breast  X-ray (mammogram) every year.  If you have a family history of breast cancer, talk to your health care provider about genetic screening.  If you are at high risk for breast cancer, talk to your health care provider about having an MRI and a mammogram every year.  Breast cancer gene (BRCA) assessment is recommended for women who have family members with BRCA-related cancers. BRCA-related cancers include: ? Breast. ? Ovarian. ? Tubal. ? Peritoneal cancers.  Results of the assessment will determine the need for genetic counseling and BRCA1  and BRCA2 testing.  Cervical Cancer Your health care provider may recommend that you be screened regularly for cancer of the pelvic organs (ovaries, uterus, and vagina). This screening involves a pelvic examination, including checking for microscopic changes to the surface of your cervix (Pap test). You may be encouraged to have this screening done every 3 years, beginning at age 39.  For women ages 33-65, health care providers may recommend pelvic exams and Pap testing every 3 years, or they may recommend the Pap and pelvic exam, combined with testing for human papilloma virus (HPV), every 5 years. Some types of HPV increase your risk of cervical cancer. Testing for HPV may also be done on women of any age with unclear Pap test results.  Other health care providers may not recommend any screening for nonpregnant women who are considered low risk for pelvic cancer and who do not have symptoms. Ask your health care provider if a screening pelvic exam is right for you.  If you have had past treatment for cervical cancer or a condition that could lead to cancer, you need Pap tests and screening for cancer for at least 20 years after your treatment. If Pap tests have been discontinued, your risk factors (such as having a new sexual partner) need to be reassessed to determine if screening should resume. Some women have medical problems that increase the chance of getting cervical cancer. In these cases, your health care provider may recommend more frequent screening and Pap tests.  Colorectal Cancer  This type of cancer can be detected and often prevented.  Routine colorectal cancer screening usually begins at 82 years of age and continues through 82 years of age.  Your health care provider may recommend screening at an earlier age if you have risk factors for colon cancer.  Your health care provider may also recommend using home test kits to check for hidden blood in the stool.  A small camera at  the end of a tube can be used to examine your colon directly (sigmoidoscopy or colonoscopy). This is done to check for the earliest forms of colorectal cancer.  Routine screening usually begins at age 95.  Direct examination of the colon should be repeated every 5-10 years through 82 years of age. However, you may need to be screened more often if early forms of precancerous polyps or small growths are found.  Skin Cancer  Check your skin from head to toe regularly.  Tell your health care provider about any new moles or changes in moles, especially if there is a change in a mole's shape or color.  Also tell your health care provider if you have a mole that is larger than the size of a pencil eraser.  Always use sunscreen. Apply sunscreen liberally and repeatedly throughout the day.  Protect yourself by wearing long sleeves, pants, a wide-brimmed hat, and sunglasses whenever you are outside.  Heart disease, diabetes, and high blood pressure  High blood  pressure causes heart disease and increases the risk of stroke. High blood pressure is more likely to develop in: ? People who have blood pressure in the high end of the normal range (130-139/85-89 mm Hg). ? People who are overweight or obese. ? People who are African American.  If you are 67-37 years of age, have your blood pressure checked every 3-5 years. If you are 29 years of age or older, have your blood pressure checked every year. You should have your blood pressure measured twice-once when you are at a hospital or clinic, and once when you are not at a hospital or clinic. Record the average of the two measurements. To check your blood pressure when you are not at a hospital or clinic, you can use: ? An automated blood pressure machine at a pharmacy. ? A home blood pressure monitor.  If you are between 6 years and 14 years old, ask your health care provider if you should take aspirin to prevent strokes.  Have regular diabetes  screenings. This involves taking a blood sample to check your fasting blood sugar level. ? If you are at a normal weight and have a low risk for diabetes, have this test once every three years after 82 years of age. ? If you are overweight and have a high risk for diabetes, consider being tested at a younger age or more often. Preventing infection Hepatitis B  If you have a higher risk for hepatitis B, you should be screened for this virus. You are considered at high risk for hepatitis B if: ? You were born in a country where hepatitis B is common. Ask your health care provider which countries are considered high risk. ? Your parents were born in a high-risk country, and you have not been immunized against hepatitis B (hepatitis B vaccine). ? You have HIV or AIDS. ? You use needles to inject street drugs. ? You live with someone who has hepatitis B. ? You have had sex with someone who has hepatitis B. ? You get hemodialysis treatment. ? You take certain medicines for conditions, including cancer, organ transplantation, and autoimmune conditions.  Hepatitis C  Blood testing is recommended for: ? Everyone born from [birth year] through 1963. ? Anyone with known risk factors for hepatitis C.  Sexually transmitted infections (STIs)  You should be screened for sexually transmitted infections (STIs) including gonorrhea and chlamydia if: ? You are sexually active and are younger than 82 years of age. ? You are older than 82 years of age and your health care provider tells you that you are at risk for this type of infection. ? Your sexual activity has changed since you were last screened and you are at an increased risk for chlamydia or gonorrhea. Ask your health care provider if you are at risk.  If you do not have HIV, but are at risk, it may be recommended that you take a prescription medicine daily to prevent HIV infection. This is called pre-exposure prophylaxis (PrEP). You are considered at risk  if: ? You are sexually active and do not regularly use condoms or know the HIV status of your partner(s). ? You take drugs by injection. ? You are sexually active with a partner who has HIV.  Talk with your health care provider about whether you are at high risk of being infected with HIV. If you choose to begin PrEP, you should first be tested for HIV. You should then be tested every 3 months for as  long as you are taking PrEP. Pregnancy  If you are premenopausal and you may become pregnant, ask your health care provider about preconception counseling.  If you may become pregnant, take 400 to 800 micrograms (mcg) of folic acid every day.  If you want to prevent pregnancy, talk to your health care provider about birth control (contraception). Osteoporosis and menopause  Osteoporosis is a disease in which the bones lose minerals and strength with aging. This can result in serious bone fractures. Your risk for osteoporosis can be identified using a bone density scan.  If you are 94 years of age or older, or if you are at risk for osteoporosis and fractures, ask your health care provider if you should be screened.  Ask your health care provider whether you should take a calcium or vitamin D supplement to lower your risk for osteoporosis.  Menopause may have certain physical symptoms and risks.  Hormone replacement therapy may reduce some of these symptoms and risks. Talk to your health care provider about whether hormone replacement therapy is right for you. Follow these instructions at home:  Schedule regular health, dental, and eye exams.  Stay current with your immunizations.  Do not use any tobacco products including cigarettes, chewing tobacco, or electronic cigarettes.  If you are pregnant, do not drink alcohol.  If you are breastfeeding, limit how much and how often you drink alcohol.  Limit alcohol intake to no more than 1 drink per day for nonpregnant women. One drink  equals 12 ounces of beer, 5 ounces of wine, or 1 ounces of hard liquor.  Do not use street drugs.  Do not share needles.  Ask your health care provider for help if you need support or information about quitting drugs.  Tell your health care provider if you often feel depressed.  Tell your health care provider if you have ever been abused or do not feel safe at home. This information is not intended to replace advice given to you by your health care provider. Make sure you discuss any questions you have with your health care provider. Document Released: 02/06/2011 Document Revised: 12/30/2015 Document Reviewed: 04/27/2015 Elsevier Interactive Patient Education  [employer].

## 2017-09-19 NOTE — Patient Instructions (Signed)
Follow up in 6 months to recheck BP and cholesterol We'll notify you of your lab results and make any changes if needed Continue to make healthy food choices The toe is mildly ingrown but not infected- watch for pus, redness, swelling, warmth.  If any of those, please go to podiatry Call with any questions or concerns Happy Valentine's Day!!!

## 2017-09-19 NOTE — Assessment & Plan Note (Signed)
Deteriorated.  Pt is sleeping more than previously- which I explained to the pt's daughter is part of the dementia process.  Pt is not able to answer ROS reliably.  No behavioral disturbances.  Will follow.

## 2017-09-19 NOTE — Assessment & Plan Note (Signed)
Check Vit D and replete prn. 

## 2017-09-19 NOTE — Assessment & Plan Note (Signed)
Chronic problem.  Tolerating statin w/o difficulty.  Check labs.  Adjust meds prn  

## 2017-09-20 ENCOUNTER — Encounter: Payer: Self-pay | Admitting: General Practice

## 2017-09-20 LAB — CBC WITH DIFFERENTIAL/PLATELET
BASOS ABS: 51 {cells}/uL (ref 0–200)
Basophils Relative: 1 %
EOS ABS: 230 {cells}/uL (ref 15–500)
EOS PCT: 4.5 %
HCT: 39.2 % (ref 35.0–45.0)
HEMOGLOBIN: 13.3 g/dL (ref 11.7–15.5)
Lymphs Abs: 1025 cells/uL (ref 850–3900)
MCH: 30.3 pg (ref 27.0–33.0)
MCHC: 33.9 g/dL (ref 32.0–36.0)
MCV: 89.3 fL (ref 80.0–100.0)
MONOS PCT: 7.9 %
MPV: 11 fL (ref 7.5–12.5)
NEUTROS ABS: 3392 {cells}/uL (ref 1500–7800)
NEUTROS PCT: 66.5 %
PLATELETS: 165 10*3/uL (ref 140–400)
RBC: 4.39 10*6/uL (ref 3.80–5.10)
RDW: 12.7 % (ref 11.0–15.0)
TOTAL LYMPHOCYTE: 20.1 %
WBC mixed population: 403 cells/uL (ref 200–950)
WBC: 5.1 10*3/uL (ref 3.8–10.8)

## 2017-09-20 LAB — BASIC METABOLIC PANEL
BUN/Creatinine Ratio: 18 (calc) (ref 6–22)
BUN: 19 mg/dL (ref 7–25)
CALCIUM: 9.2 mg/dL (ref 8.6–10.4)
CHLORIDE: 105 mmol/L (ref 98–110)
CO2: 24 mmol/L (ref 20–32)
Creat: 1.05 mg/dL — ABNORMAL HIGH (ref 0.60–0.88)
GLUCOSE: 104 mg/dL — AB (ref 65–99)
Potassium: 4 mmol/L (ref 3.5–5.3)
SODIUM: 142 mmol/L (ref 135–146)

## 2017-09-20 LAB — HEPATIC FUNCTION PANEL
AG Ratio: 1.6 (calc) (ref 1.0–2.5)
ALKALINE PHOSPHATASE (APISO): 98 U/L (ref 33–130)
ALT: 9 U/L (ref 6–29)
AST: 14 U/L (ref 10–35)
Albumin: 4 g/dL (ref 3.6–5.1)
BILIRUBIN INDIRECT: 0.4 mg/dL (ref 0.2–1.2)
Bilirubin, Direct: 0.1 mg/dL (ref 0.0–0.2)
Globulin: 2.5 g/dL (calc) (ref 1.9–3.7)
Total Bilirubin: 0.5 mg/dL (ref 0.2–1.2)
Total Protein: 6.5 g/dL (ref 6.1–8.1)

## 2017-09-20 LAB — LIPID PANEL
Cholesterol: 174 mg/dL (ref <200)
HDL: 48 mg/dL — AB (ref >50)
LDL CHOLESTEROL (CALC): 97 mg/dL
NON-HDL CHOLESTEROL (CALC): 126 mg/dL (ref <130)
TRIGLYCERIDES: 191 mg/dL — AB (ref <150)
Total CHOL/HDL Ratio: 3.6 (calc) (ref <5.0)

## 2017-09-20 LAB — TSH: TSH: 1.34 mIU/L (ref 0.40–4.50)

## 2017-09-20 LAB — VITAMIN D 25 HYDROXY (VIT D DEFICIENCY, FRACTURES): VIT D 25 HYDROXY: 40 ng/mL (ref 30–100)

## 2017-09-23 ENCOUNTER — Other Ambulatory Visit: Payer: Self-pay | Admitting: Family Medicine

## 2017-09-24 NOTE — Telephone Encounter (Signed)
Last OV 09/19/17 Tramadol last filled 08/27/17 #60 with 0

## 2017-10-03 DIAGNOSIS — H35372 Puckering of macula, left eye: Secondary | ICD-10-CM | POA: Diagnosis not present

## 2017-10-03 DIAGNOSIS — Z961 Presence of intraocular lens: Secondary | ICD-10-CM | POA: Diagnosis not present

## 2017-10-06 ENCOUNTER — Other Ambulatory Visit: Payer: Self-pay | Admitting: Family Medicine

## 2017-10-16 ENCOUNTER — Encounter: Payer: Self-pay | Admitting: Physician Assistant

## 2017-10-16 ENCOUNTER — Ambulatory Visit (INDEPENDENT_AMBULATORY_CARE_PROVIDER_SITE_OTHER): Payer: Medicare HMO | Admitting: Physician Assistant

## 2017-10-16 ENCOUNTER — Other Ambulatory Visit: Payer: Self-pay

## 2017-10-16 ENCOUNTER — Telehealth: Payer: Self-pay | Admitting: Family Medicine

## 2017-10-16 VITALS — BP 118/70 | HR 47 | Temp 97.9°F | Resp 14 | Ht 64.0 in | Wt 153.0 lb

## 2017-10-16 DIAGNOSIS — R5383 Other fatigue: Secondary | ICD-10-CM

## 2017-10-16 DIAGNOSIS — R001 Bradycardia, unspecified: Secondary | ICD-10-CM

## 2017-10-16 DIAGNOSIS — F0390 Unspecified dementia without behavioral disturbance: Secondary | ICD-10-CM | POA: Diagnosis not present

## 2017-10-16 LAB — POCT URINALYSIS DIPSTICK
GLUCOSE UA: NEGATIVE
Ketones, UA: NEGATIVE
NITRITE UA: NEGATIVE
PH UA: 5.5 (ref 5.0–8.0)
RBC UA: NEGATIVE
UROBILINOGEN UA: 1 U/dL

## 2017-10-16 NOTE — Progress Notes (Signed)
Patient presents to clinic today with her daughter who are requesting urine testing for potential UTI. Patient with a history of dementia. Daughter has noted two episodes in the past few weeks where she has been harder to wake up in the morning. Otherwise has noted mother's cognition to be at baseline. Is hydrating well. Denies recent change to medications. Denies dysuria, urinary urgency, frequency. Denies hematuria or complaints of abdominal, lower back or flank pain. Denies fever, chills. Deny any complaint or notation of chest pain, dizziness, racing heart or SOB.  Past Medical History:  Diagnosis Date  . Allergic rhinitis   . Bradycardia    January, 2013  . CAD (coronary artery disease)    a. s/p CABG in 2004 b. cath in 2009 showing patent LIMA-LAD, patent SVG-RI, patent RCA, occluded LM  . Carotid artery disease ([place])    Doppler, October, 2011, 0-39% bilateral  . Colon polyp   . Colon polyps   . Diabetes mellitus without complication ([place])   . Ejection fraction    normal, catheterization, 2009  . Hearing loss   . Hemidiaphragm    elevated hemidiaphragm  . Hx of CABG    2004  . Hyperlipidemia   . Hypertension   . Osteoarthritis cervical spine     Current Outpatient Medications on File Prior to Visit  Medication Sig Dispense Refill  . acetaminophen (TYLENOL) 500 MG tablet Take 1,000 mg by mouth 2 (two) times daily.     Marland Kitchen aspirin 81 MG tablet Take 81 mg by mouth daily.      Marland Kitchen atorvastatin (LIPITOR) 20 MG tablet TAKE 1 TABLET BY MOUTH AS DIRECTED 90 tablet 0  . Calcium Carbonate-Vitamin D 600-400 MG-UNIT per tablet Take 2 tablets by mouth daily.    . chlorthalidone (HYGROTON) 25 MG tablet TAKE AS DIRECTED 30 tablet 3  . cholecalciferol (VITAMIN D) 1000 UNITS tablet Take 1,000 Units by mouth daily.    Marland Kitchen docusate sodium (COLACE) 100 MG capsule TAKE ONE CAPSULE BY MOUTH EVERY 12 HOURS 60 capsule 4  . donepezil (ARICEPT) 10 MG tablet TAKE 1 TABLET (10 MG TOTAL) BY MOUTH AT  BEDTIME. 90 tablet 1  . fluticasone (FLONASE) 50 MCG/ACT nasal spray Place 2 sprays into both nostrils daily. 16 g 6  . gabapentin (NEURONTIN) 100 MG capsule TAKE 1 CAPSULE BY MOUTH 3 TIMES DAILY AS NEEDED. 270 capsule 1  . KLOR-CON 10 10 MEQ tablet TAKE 1 TABLET BY MOUTH 2 TIMES DAILY. 60 tablet 6  . loratadine (CLARITIN) 10 MG tablet Take 10 mg by mouth daily.    . nitroGLYCERIN (NITROSTAT) 0.4 MG SL tablet Place 1 tablet (0.4 mg total) under the tongue every 5 (five) minutes as needed for chest pain. 25 tablet 11  . polyethylene glycol (MIRALAX / GLYCOLAX) packet Take 17 g by mouth every other day.     . traMADol (ULTRAM) 50 MG tablet TAKE 1 TABLET BY MOUTH EVERY 6 HOURS AS NEEDED FOR PAIN 60 tablet 0   No current facility-administered medications on file prior to visit.     Allergies  Allergen Reactions  . Codeine     REACTION: Elevated Heart rate  . Horse-Derived Products     unknown  . Tetanus Toxoid     unknown    Family History  Problem Relation Age of Onset  . Dementia Mother   . Breast cancer Mother   . Heart attack Father   . Stroke Father     Social History  Socioeconomic History  . Marital status: Married    Spouse name: None  . Number of children: None  . Years of education: None  . Highest education level: None  Social Needs  . Financial resource strain: None  . Food insecurity - worry: None  . Food insecurity - inability: None  . Transportation needs - medical: None  . Transportation needs - non-medical: None  Occupational History  . Occupation: retired    Comment: office work  Tobacco Use  . Smoking status: Former Smoker    Types: Cigarettes    Last attempt to quit: 08/07/1948    Years since quitting: 69.2  . Smokeless tobacco: Never Used  Substance and Sexual Activity  . Alcohol use: No    Alcohol/week: 0.0 oz  . Drug use: No  . Sexual activity: None  Other Topics Concern  . None  Social History Narrative   Widowed 2000.   Review of  Systems - See HPI.  All other ROS are negative.  BP 118/70   Pulse (!) 47   Temp 97.9 F (36.6 C) (Oral)   Resp 14   Ht 5\' 4"  (1.626 m)   Wt 153 lb (69.4 kg)   SpO2 97%   BMI 26.26 kg/m   Physical Exam  Constitutional: She is oriented to person, place, and time and well-developed, well-nourished, and in no distress.  HENT:  Head: Normocephalic and atraumatic.  Eyes: Conjunctivae are normal.  Neck: Neck supple.  Cardiovascular: Normal rate and regular rhythm.  Pulmonary/Chest: Effort normal and breath sounds normal. No respiratory distress. She has no wheezes. She has no rales. She exhibits no tenderness.  Neurological: She is alert and oriented to person, place, and time.    Recent Results (from the past 2160 hour(s))  Lipid panel     Status: Abnormal   Collection Time: 09/19/17  4:07 PM  Result Value Ref Range   Cholesterol 174 <200 mg/dL   HDL 48 (L) >50 mg/dL   Triglycerides 191 (H) <150 mg/dL   LDL Cholesterol (Calc) 97 mg/dL (calc)    Comment: Reference range: <100 . Desirable range <100 mg/dL for primary prevention;   <70 mg/dL for patients with CHD or diabetic patients  with > or = 2 CHD risk factors. Marland Kitchen LDL-C is now calculated using the Hanz Winterhalter-Hopkins  calculation, which is a validated novel method providing  better accuracy than the Friedewald equation in the  estimation of LDL-C.  Cresenciano Genre et al. Annamaria Helling. 9509;326(71): 2061-2068  (http://education.[facility].com/faq/FAQ164)    Total CHOL/HDL Ratio 3.6 <5.0 (calc)   Non-HDL Cholesterol (Calc) 126 <130 mg/dL (calc)    Comment: For patients with diabetes plus 1 major ASCVD risk  factor, treating to a non-HDL-C goal of <100 mg/dL  (LDL-C of <70 mg/dL) is considered a therapeutic  option.   Basic metabolic panel     Status: Abnormal   Collection Time: 09/19/17  4:07 PM  Result Value Ref Range   Glucose, Bld 104 (H) 65 - 99 mg/dL    Comment: .            Fasting reference interval . For someone  without known diabetes, a glucose value between 100 and 125 mg/dL is consistent with prediabetes and should be confirmed with a follow-up test. .    BUN 19 7 - 25 mg/dL   Creat 1.05 (H) 0.60 - 0.88 mg/dL    Comment: For patients >81 years of age, the reference limit for Creatinine is approximately 13% higher for  people identified as African-American. .    BUN/Creatinine Ratio 18 6 - 22 (calc)   Sodium 142 135 - 146 mmol/L   Potassium 4.0 3.5 - 5.3 mmol/L   Chloride 105 98 - 110 mmol/L   CO2 24 20 - 32 mmol/L   Calcium 9.2 8.6 - 10.4 mg/dL  TSH     Status: None   Collection Time: 09/19/17  4:07 PM  Result Value Ref Range   TSH 1.34 0.40 - 4.50 mIU/L  Hepatic function panel     Status: None   Collection Time: 09/19/17  4:07 PM  Result Value Ref Range   Total Protein 6.5 6.1 - 8.1 g/dL   Albumin 4.0 3.6 - 5.1 g/dL   Globulin 2.5 1.9 - 3.7 g/dL (calc)   AG Ratio 1.6 1.0 - 2.5 (calc)   Total Bilirubin 0.5 0.2 - 1.2 mg/dL   Bilirubin, Direct 0.1 0.0 - 0.2 mg/dL   Indirect Bilirubin 0.4 0.2 - 1.2 mg/dL (calc)   Alkaline phosphatase (APISO) 98 33 - 130 U/L   AST 14 10 - 35 U/L   ALT 9 6 - 29 U/L  CBC with Differential/Platelet     Status: None   Collection Time: 09/19/17  4:07 PM  Result Value Ref Range   WBC 5.1 3.8 - 10.8 Thousand/uL   RBC 4.39 3.80 - 5.10 Million/uL   Hemoglobin 13.3 11.7 - 15.5 g/dL   HCT 39.2 35.0 - 45.0 %   MCV 89.3 80.0 - 100.0 fL   MCH 30.3 27.0 - 33.0 pg   MCHC 33.9 32.0 - 36.0 g/dL   RDW 12.7 11.0 - 15.0 %   Platelets 165 140 - 400 Thousand/uL   MPV 11.0 7.5 - 12.5 fL   Neutro Abs 3,392 1,500 - 7,800 cells/uL   Lymphs Abs 1,025 850 - 3,900 cells/uL   WBC mixed population 403 200 - 950 cells/uL   Eosinophils Absolute 230 15 - 500 cells/uL   Basophils Absolute 51 0 - 200 cells/uL   Neutrophils Relative % 66.5 %   Total Lymphocyte 20.1 %   Monocytes Relative 7.9 %   Eosinophils Relative 4.5 %   Basophils Relative 1.0 %  VITAMIN D 25 Hydroxy  (Vit-D Deficiency, Fractures)     Status: None   Collection Time: 09/19/17  4:07 PM  Result Value Ref Range   Vit D, 25-Hydroxy 40 30 - 100 ng/mL    Comment: Vitamin D Status         25-OH Vitamin D: . Deficiency:                    <20 ng/mL Insufficiency:             20 - 29 ng/mL Optimal:                 > or = 30 ng/mL . For 25-OH Vitamin D testing on patients on  D2-supplementation and patients for whom quantitation  of D2 and D3 fractions is required, the [facility](TM) 25-OH VIT D, (D2,D3), LC/MS/MS is recommended: order  code [phone] (patients >3yrs). . For more information on this test, go to: http://education.[facility].com/faq/FAQ163 (This link is being provided for  informational/educational purposes only.)   POCT Urinalysis Dipstick     Status: Abnormal   Collection Time: 10/16/17  3:14 PM  Result Value Ref Range   Color, UA yellow    Clarity, UA clear    Glucose, UA negative    Bilirubin, UA 1+  Ketones, UA negative    Spec Grav, UA >=1.030 (A) 1.010 - 1.025   Blood, UA negative    pH, UA 5.5 5.0 - 8.0   Protein, UA trace    Urobilinogen, UA 1.0 0.2 or 1.0 E.U./dL   Nitrite, UA negative    Leukocytes, UA Moderate (2+) (A) Negative   Appearance     Odor    Urine Culture     Status: None   Collection Time: 10/16/17  4:07 PM  Result Value Ref Range   MICRO NUMBER: [id number]    SPECIMEN QUALITY: ADEQUATE    Sample Source URINE    STATUS: FINAL    ISOLATE 1:      Three or more organisms present, each greater than 10,000 cu/mL. May represent normal flora contamination from external genitalia. No further testing is required.    Assessment/Plan: 1. Fatigue, unspecified type Urine dip with trace LE. No other signs/symptoms of UTI. Will await culture results. Concern that fatigue is due to worsening dementia and potential the marked bradycardia noted on exam and EKG today. Will set her up urgently with her Cardiologist for reassessment. Repeat labs look  good. ER precautions reviewed with daughter (caregiver). - POCT Urinalysis Dipstick  2. Bradycardia Chronic but more marked on exam. EKG without arrhythmia. Is not on any rate control medications currently.  Appt scheduled with her Cardiologist for further assessment.  - EKG 12-Lead  3. Dementia without behavioral disturbance, unspecified dementia type Will check urine culture. Suspect symptoms are related to progressing dementia. Discussed potential need for palliative care consult if this keeps declining. Continue current regimen.  - Urine Culture   Leeanne Rio, PA-C

## 2017-10-16 NOTE — Telephone Encounter (Signed)
Routing to provider to advise.  

## 2017-10-16 NOTE — Telephone Encounter (Signed)
This is most likely the natural progression of her dementia.  However, if this is a big and recent change for her, she will need to be evaluated for infection (UTI, etc).  Also, as the dementia appears to be progressing, I am happy to put in a referral to palliative care if the daughter would like some assistance going forward.

## 2017-10-16 NOTE — Patient Instructions (Signed)
Please stay well-hydrated and get plenty of rest.  I will call once urine culture has resulted since you are asymptomatic for urinary infection and there have only been two episodes of somnolence in the past few weeks.   If you guys note any new or worsening symptoms let us know. EKG shows marked bradycardia -- slow heart rate. AS such I am setting you back up to see Dr. Stanford Breed. Speak with Levada Dy at the front desk as she is working on an appointment for you.   Any acute change in symptoms, shortness of breath, dizziness or chest discomfort, call 911.

## 2017-10-16 NOTE — Telephone Encounter (Signed)
Copied from [place] [id number]. Topic: Quick Communication - See Telephone Encounter >> Oct 16, 2017 11:47 AM Ahmed Prima L wrote: CRM for notification. See Telephone encounter for:   10/16/17.  Patient's daughter Neoma Laming) called and said that her mom is sleeping all the time and she is having a hard time waking her up. She said she will get up and go to the bathroom and that's about it. She wants to know does she need to be worried? Call back is [phone]

## 2017-10-16 NOTE — Telephone Encounter (Signed)
Called and spoke with pt daughter. She does not want palliative care referral at this time. However, she does want mom to be seen today or tomorrow. Appt was made with Elyn Aquas today at 3:15 for 30 minutes. Message routed to [facility] for review.

## 2017-10-17 LAB — URINE CULTURE
MICRO NUMBER: 90313243
SPECIMEN QUALITY:: ADEQUATE

## 2017-10-23 ENCOUNTER — Ambulatory Visit: Payer: Medicare HMO | Admitting: Physician Assistant

## 2017-10-23 ENCOUNTER — Encounter: Payer: Self-pay | Admitting: Physician Assistant

## 2017-10-23 VITALS — BP 112/56 | HR 49 | Ht 64.0 in | Wt 155.0 lb

## 2017-10-23 DIAGNOSIS — I10 Essential (primary) hypertension: Secondary | ICD-10-CM | POA: Diagnosis not present

## 2017-10-23 DIAGNOSIS — E785 Hyperlipidemia, unspecified: Secondary | ICD-10-CM | POA: Diagnosis not present

## 2017-10-23 DIAGNOSIS — R001 Bradycardia, unspecified: Secondary | ICD-10-CM

## 2017-10-23 DIAGNOSIS — I779 Disorder of arteries and arterioles, unspecified: Secondary | ICD-10-CM | POA: Diagnosis not present

## 2017-10-23 DIAGNOSIS — I739 Peripheral vascular disease, unspecified: Secondary | ICD-10-CM

## 2017-10-23 DIAGNOSIS — I25810 Atherosclerosis of coronary artery bypass graft(s) without angina pectoris: Secondary | ICD-10-CM | POA: Diagnosis not present

## 2017-10-23 NOTE — Progress Notes (Signed)
Cardiology Office Note    Date:  10/24/2017   ID:  Wanda Wright, DOB April 03, 1929, MRN [id number]  PCP:  Midge Minium, MD  Cardiologist:  Dr. Stanford Breed   Chief Complaint  Patient presents with  . Follow-up    Yearly visit    History of Present Illness:  Wanda Wright is a 85 y.o. female with PMH of CAD s/p CABG 2004 (patent LIMA to LAD, SVG to RI, SVG to RCA, occluded LM on cath 2009), carotid artery disease, hypertension, hyperlipidemia and dementia.  She was last seen by Bernerd Pho PA-C in March 2018 after she presented to the ED for sharp epigastric pain.  Her symptom relieved with GI cocktail.  Patient presents today for cardiology office visit.  During the recent office visit with her PCP, it was noted her heart rate was low at 47.  Talking with the patient today, she has been completely asymptomatic and denies any chest pain or shortness of breath.  She does not have any significant dizziness, blurred vision or feeling of passing out.  She does not have any signs and symptoms of heart failure.  I recommended 24-hour Holter monitor to assess the average heart rate.  However given the asymptomatic nature of her bradycardia, I would not recommend any pacemaker device at this time.  Ideally, patient will need a ETT to assess her heart rate response with exertion, however I wish to hold off on this at this time as I am not confident with her advanced age how far she can walk on the treadmill.   Past Medical History:  Diagnosis Date  . Allergic rhinitis   . Bradycardia    January, 2013  . CAD (coronary artery disease)    a. s/p CABG in 2004 b. cath in 2009 showing patent LIMA-LAD, patent SVG-RI, patent RCA, occluded LM  . Carotid artery disease ([place])    Doppler, October, 2011, 0-39% bilateral  . Colon polyp   . Colon polyps   . Diabetes mellitus without complication ([place])   . Ejection fraction    normal, catheterization, 2009  . Hearing loss   . Hemidiaphragm    elevated hemidiaphragm  . Hx of CABG    2004  . Hyperlipidemia   . Hypertension   . Osteoarthritis cervical spine     Past Surgical History:  Procedure Laterality Date  . BREAST LUMPECTOMY     multiple, all benign  . CATARACT EXTRACTION Bilateral   . CHOLECYSTECTOMY    . COLONOSCOPY W/ POLYPECTOMY    . CORONARY ARTERY BYPASS GRAFT     ACS  . HIATAL HERNIA REPAIR    . lid lift Bilateral   . TOTAL ABDOMINAL HYSTERECTOMY      Current Medications: Outpatient Medications Prior to Visit  Medication Sig Dispense Refill  . acetaminophen (TYLENOL) 500 MG tablet Take 1,000 mg by mouth 2 (two) times daily.     Marland Kitchen aspirin 81 MG tablet Take 81 mg by mouth daily.      Marland Kitchen atorvastatin (LIPITOR) 20 MG tablet TAKE 1 TABLET BY MOUTH AS DIRECTED 90 tablet 0  . Calcium Carbonate-Vitamin D 600-400 MG-UNIT per tablet Take 2 tablets by mouth daily.    . chlorthalidone (HYGROTON) 25 MG tablet TAKE AS DIRECTED 30 tablet 3  . cholecalciferol (VITAMIN D) 1000 UNITS tablet Take 1,000 Units by mouth daily.    Marland Kitchen docusate sodium (COLACE) 100 MG capsule TAKE ONE CAPSULE BY MOUTH EVERY 12 HOURS 60 capsule 4  .  donepezil (ARICEPT) 10 MG tablet TAKE 1 TABLET (10 MG TOTAL) BY MOUTH AT BEDTIME. 90 tablet 1  . fluticasone (FLONASE) 50 MCG/ACT nasal spray Place 2 sprays into both nostrils daily. 16 g 6  . gabapentin (NEURONTIN) 100 MG capsule TAKE 1 CAPSULE BY MOUTH 3 TIMES DAILY AS NEEDED. 270 capsule 1  . Inulin (FIBER CHOICE) 1.5 g CHEW Chew 1 each by mouth daily.    Marland Kitchen KLOR-CON 10 10 MEQ tablet TAKE 1 TABLET BY MOUTH 2 TIMES DAILY. 60 tablet 6  . loratadine (CLARITIN) 10 MG tablet Take 10 mg by mouth daily.    . nitroGLYCERIN (NITROSTAT) 0.4 MG SL tablet Place 1 tablet (0.4 mg total) under the tongue every 5 (five) minutes as needed for chest pain. 25 tablet 11  . polyethylene glycol (MIRALAX / GLYCOLAX) packet Take 17 g by mouth every other day.     . traMADol (ULTRAM) 50 MG tablet TAKE 1 TABLET BY MOUTH EVERY 6  HOURS AS NEEDED FOR PAIN 60 tablet 0   No facility-administered medications prior to visit.      Allergies:   Codeine; Horse-derived products; and Tetanus toxoid   Social History   Socioeconomic History  . Marital status: Married    Spouse name: None  . Number of children: None  . Years of education: None  . Highest education level: None  Social Needs  . Financial resource strain: None  . Food insecurity - worry: None  . Food insecurity - inability: None  . Transportation needs - medical: None  . Transportation needs - non-medical: None  Occupational History  . Occupation: retired    Comment: office work  Tobacco Use  . Smoking status: Former Smoker    Types: Cigarettes    Last attempt to quit: 08/07/1948    Years since quitting: 69.2  . Smokeless tobacco: Never Used  Substance and Sexual Activity  . Alcohol use: No    Alcohol/week: 0.0 oz  . Drug use: No  . Sexual activity: None  Other Topics Concern  . None  Social History Narrative   Widowed 2000.     Family History:  The patient's family history includes Breast cancer in her mother; Dementia in her mother; Heart attack in her father; Stroke in her father.   ROS:   Please see the history of present illness.    ROS All other systems reviewed and are negative.   PHYSICAL EXAM:   VS:  BP (!) 112/56   Pulse (!) 49   Ht 5\' 4"  (1.626 m)   Wt 155 lb (70.3 kg)   BMI 26.61 kg/m    GEN: Well nourished, well developed, in no acute distress  HEENT: normal  Neck: no JVD, carotid bruits, or masses Cardiac: RRR; no murmurs, rubs, or gallops,no edema  Respiratory:  clear to auscultation bilaterally, normal work of breathing GI: soft, nontender, nondistended, + BS MS: no deformity or atrophy  Skin: warm and dry, no rash Neuro:  Alert and Oriented x 3, Strength and sensation are intact Psych: euthymic mood, full affect  Wt Readings from Last 3 Encounters:  10/23/17 155 lb (70.3 kg)  10/16/17 153 lb (69.4 kg)    09/19/17 152 lb 9.6 oz (69.2 kg)      Studies/Labs Reviewed:   EKG:  EKG is ordered today.  The ekg ordered today demonstrates sinus brady with HR 49  Recent Labs: 09/19/2017: ALT 9; BUN 19; Creat 1.05; Hemoglobin 13.3; Platelets 165; Potassium 4.0; Sodium 142; TSH  1.34   Lipid Panel    Component Value Date/Time   CHOL 174 09/19/2017 1607   TRIG 191 (H) 09/19/2017 1607   HDL 48 (L) 09/19/2017 1607   CHOLHDL 3.6 09/19/2017 1607   VLDL 32.0 01/17/2017 1354   LDLCALC 97 09/19/2017 1607   LDLDIRECT 79.0 07/19/2016 1440    Additional studies/ records that were reviewed today include:    Cath 09/05/2002  FINDINGS:  1. Left main:  There is a 99% stenosis of the distal left main with TIMI-2     flow to the left anterior descending.  2. Left anterior descending:  The LAD is a moderate sized vessel giving rise     to two diagonal branches.  In limited views, there is no significant     disease of the vessel.  3. Circumflex: The circumflex appears to be occluded at its ostium.  4. Right coronary artery:  The RCA is a large common dominant vessel giving     large to a large posterior left ventricular branch that supplies much of     the posterior lateral wall.  It is angiographically normal.   ASSESSMENT:    1. Bradycardia   2. Coronary artery disease involving coronary bypass graft of native heart without angina pectoris   3. Carotid artery disease, unspecified laterality, unspecified type ([place])   4. Essential hypertension   5. Hyperlipidemia, unspecified hyperlipidemia type      PLAN:  In order of problems listed above:  1. Sinus bradycardia: Heart rate in the high 40s, however patient has been completely asymptomatic.  She denies any recent weakness, dizziness, blurred vision or feeling of passing out.  I will obtain a 24-hour Holter monitor to assess average heart rate.  I did not obtain ETT today to measure her heart rate response as I am not confident with her advanced  age that she can walk on the treadmill  -Patient is not on any AV nodal blocking agent.  Last TSH was normal in February 2019.  2. CAD s/p CABG: No recent angina.  Continue aspirin and a statin  3. Carotid artery disease: Continue ASA and statin. Per previous report, 0-39% bilateral disease in 05/2010, consider repeat US on followup  4. Hypertension: Blood pressure stable  5. Hyperlipidemia: On Lipitor 20 mg daily.  Last lipid panel obtained on 09/19/2017 showed total cholesterol 174, triglyceride 191, HDL 48    Medication Adjustments/Labs and Tests Ordered: Current medicines are reviewed at length with the patient today.  Concerns regarding medicines are outlined above.  Medication changes, Labs and Tests ordered today are listed in the Patient Instructions below. Patient Instructions  Medication Instructions:  Your physician recommends that you continue on your current medications as directed. Please refer to the Current Medication list given to you today.  Labwork: none  Testing/Procedures: Your physician has recommended that you wear a 24 HOUR holter monitor. Holter monitors are medical devices that record the heart's electrical activity. Doctors most often use these monitors to diagnose arrhythmias. Arrhythmias are problems with the speed or rhythm of the heartbeat. The monitor is a small, portable device. You can wear one while you do your normal daily activities. This is usually used to diagnose what is causing palpitations/syncope (passing out). THIS MONITOR WILL BE PLACED AT THE [address] OFFICE: [place]  Follow-Up: Your physician recommends that you schedule a follow-up appointment in: 2-3 [place].  Any Other Special Instructions Will Be Listed Below (If Applicable).  If you need a refill on your cardiac medications before your next appointment, please call your pharmacy.     Hilbert Corrigan, [place]  10/24/2017 2:05 PM    [facility] [address], [place], [place]  [address] Phone: [phone]; Fax: [phone]

## 2017-10-23 NOTE — Patient Instructions (Signed)
Medication Instructions:  Your physician recommends that you continue on your current medications as directed. Please refer to the Current Medication list given to you today.  Labwork: none  Testing/Procedures: Your physician has recommended that you wear a 24 HOUR holter monitor. Holter monitors are medical devices that record the heart's electrical activity. Doctors most often use these monitors to diagnose arrhythmias. Arrhythmias are problems with the speed or rhythm of the heartbeat. The monitor is a small, portable device. You can wear one while you do your normal daily activities. This is usually used to diagnose what is causing palpitations/syncope (passing out). THIS MONITOR WILL BE PLACED AT THE [address] OFFICE: [place]  Follow-Up: Your physician recommends that you schedule a follow-up appointment in: 2-3 [place].  Any Other Special Instructions Will Be Listed Below (If Applicable). If you need a refill on your cardiac medications before your next appointment, please call your pharmacy.

## 2017-10-24 ENCOUNTER — Encounter: Payer: Self-pay | Admitting: Physician Assistant

## 2017-10-30 DIAGNOSIS — H43813 Vitreous degeneration, bilateral: Secondary | ICD-10-CM | POA: Diagnosis not present

## 2017-10-30 DIAGNOSIS — H353131 Nonexudative age-related macular degeneration, bilateral, early dry stage: Secondary | ICD-10-CM | POA: Diagnosis not present

## 2017-10-30 DIAGNOSIS — H35372 Puckering of macula, left eye: Secondary | ICD-10-CM | POA: Diagnosis not present

## 2017-11-04 ENCOUNTER — Other Ambulatory Visit: Payer: Self-pay | Admitting: Family Medicine

## 2017-11-05 NOTE — Telephone Encounter (Signed)
Last OV 09/19/17 Tramadol last filled 09/24/17 #60 with 0

## 2017-11-05 NOTE — Telephone Encounter (Signed)
Database reviewed, no red flags.  Refill provided

## 2017-11-06 ENCOUNTER — Ambulatory Visit (INDEPENDENT_AMBULATORY_CARE_PROVIDER_SITE_OTHER): Payer: Medicare HMO

## 2017-11-06 DIAGNOSIS — R001 Bradycardia, unspecified: Secondary | ICD-10-CM | POA: Diagnosis not present

## 2017-11-26 ENCOUNTER — Other Ambulatory Visit: Payer: Self-pay | Admitting: Family Medicine

## 2017-11-29 ENCOUNTER — Other Ambulatory Visit: Payer: Self-pay | Admitting: Family Medicine

## 2017-11-29 ENCOUNTER — Telehealth: Payer: Self-pay | Admitting: Family Medicine

## 2017-11-29 NOTE — Telephone Encounter (Signed)
Copied from [place]. Topic: General - Other >> Nov 29, 2017 12:40 PM Valla Leaver wrote: Reason for CRM: Patient daughter, Tessie Fass, would like a call back to discuss the patient not wanting to get up after waking from sleep. She says she's been sleeping a lot as well.

## 2017-11-29 NOTE — Telephone Encounter (Signed)
Called and spoke with pt daughter Tessie Fass. I read her the note from 09/19/17 and advised that per Dr. Birdie Riddle this was a normal progression with dementia. I advised that I would send this to PCP just to make sure there was nothing else that could be causing the excess sleep.   Pt family was again asked if they would like palliative care assistance as they could help explain the normal progressions with this disease.

## 2017-12-02 ENCOUNTER — Other Ambulatory Visit: Payer: Self-pay | Admitting: Family Medicine

## 2017-12-02 NOTE — Telephone Encounter (Signed)
If she is concerned about increased fatigue and feels there may be a medical issue (other than the dementia) causing this, I am happy to see her and run labs to assess the situation

## 2017-12-03 NOTE — Telephone Encounter (Signed)
No pt daughter was ok with advice given.

## 2018-02-04 DIAGNOSIS — M19012 Primary osteoarthritis, left shoulder: Secondary | ICD-10-CM | POA: Diagnosis not present

## 2018-03-01 NOTE — Progress Notes (Signed)
HPI: FU CAD. She underwent bypass surgery in 2004. Cardiac catheterization in 2009 showed an occluded left main and a patent RCA. 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 Saphenous vein graft to the ramus intermedius was patent. The LIMA to the LAD was patent. Ejection fraction 60%. Carotid Dopplers 2011 showed no significant stenosis. Holter 4/19 showed sinus with PACs and occasional PVC. Since last seen patient denies dyspnea, chest pain, palpitations or syncope.  She has significant dementia.  Current Outpatient Medications  Medication Sig Dispense Refill  . acetaminophen (TYLENOL) 500 MG tablet Take 1,000 mg by mouth 2 (two) times daily.     Marland Kitchen aspirin 81 MG tablet Take 81 mg by mouth daily.      Marland Kitchen atorvastatin (LIPITOR) 20 MG tablet TAKE 1 TABLET BY MOUTH AS DIRECTED 90 tablet 0  . Calcium Carbonate-Vitamin D 600-400 MG-UNIT per tablet Take 2 tablets by mouth daily.    . chlorthalidone (HYGROTON) 25 MG tablet TAKE AS DIRECTED 30 tablet 3  . cholecalciferol (VITAMIN D) 1000 UNITS tablet Take 1,000 Units by mouth daily.    Marland Kitchen docusate sodium (COLACE) 100 MG capsule TAKE ONE CAPSULE BY MOUTH EVERY 12 HOURS 60 capsule 4  . donepezil (ARICEPT) 10 MG tablet TAKE 1 TABLET (10 MG TOTAL) BY MOUTH AT BEDTIME. 90 tablet 1  . fluticasone (FLONASE) 50 MCG/ACT nasal spray Place 2 sprays into both nostrils daily. 16 g 6  . gabapentin (NEURONTIN) 100 MG capsule TAKE 1 CAPSULE BY MOUTH 3 TIMES DAILY AS NEEDED. 270 capsule 1  . Inulin (FIBER CHOICE) 1.5 g CHEW Chew 1 each by mouth daily.    Marland Kitchen KLOR-CON 10 10 MEQ tablet TAKE 1 TABLET BY MOUTH TWICE A DAY 60 tablet 4  . loratadine (CLARITIN) 10 MG tablet Take 10 mg by mouth daily.    . nitroGLYCERIN (NITROSTAT) 0.4 MG SL tablet Place 1 tablet (0.4 mg total) under the tongue every 5 (five) minutes as needed for chest pain. 25 tablet 11  . polyethylene glycol (MIRALAX / GLYCOLAX) packet Take 17 g by mouth every other day.     .  traMADol (ULTRAM) 50 MG tablet TAKE 1 TABLET BY MOUTH EVERY 6 HOURS AS NEEDED FOR PAIN 60 tablet 0   No current facility-administered medications for this visit.      Past Medical History:  Diagnosis Date  . Allergic rhinitis   . Bradycardia    January, 2013  . CAD (coronary artery disease)    a. s/p CABG in 2004 b. cath in 2009 showing patent LIMA-LAD, patent SVG-RI, patent RCA, occluded LM  . Carotid artery disease ([place])    Doppler, October, 2011, 0-39% bilateral  . Colon polyp   . Colon polyps   . Diabetes mellitus without complication ([place])   . Ejection fraction    normal, catheterization, 2009  . Hearing loss   . Hemidiaphragm    elevated hemidiaphragm  . Hx of CABG    2004  . Hyperlipidemia   . Hypertension   . Osteoarthritis cervical spine     Past Surgical History:  Procedure Laterality Date  . BREAST LUMPECTOMY     multiple, all benign  . CATARACT EXTRACTION Bilateral   . CHOLECYSTECTOMY    . COLONOSCOPY W/ POLYPECTOMY    . CORONARY ARTERY BYPASS GRAFT     ACS  . HIATAL HERNIA REPAIR    . lid lift Bilateral   . TOTAL ABDOMINAL HYSTERECTOMY  Social History   Socioeconomic History  . Marital status: Married    Spouse name: Not on file  . Number of children: Not on file  . Years of education: Not on file  . Highest education level: Not on file  Occupational History  . Occupation: retired    Comment: office work  Scientific laboratory technician  . Financial resource strain: Not on file  . Food insecurity:    Worry: Not on file    Inability: Not on file  . Transportation needs:    Medical: Not on file    Non-medical: Not on file  Tobacco Use  . Smoking status: Former Smoker    Types: Cigarettes    Last attempt to quit: 08/07/1948    Years since quitting: 69.6  . Smokeless tobacco: Never Used  Substance and Sexual Activity  . Alcohol use: No    Alcohol/week: 0.0 oz  . Drug use: No  . Sexual activity: Not on file  Lifestyle  . Physical activity:    Days  per week: Not on file    Minutes per session: Not on file  . Stress: Not on file  Relationships  . Social connections:    Talks on phone: Not on file    Gets together: Not on file    Attends religious service: Not on file    Active member of club or organization: Not on file    Attends meetings of clubs or organizations: Not on file    Relationship status: Not on file  . Intimate partner violence:    Fear of current or ex partner: Not on file    Emotionally abused: Not on file    Physically abused: Not on file    Forced sexual activity: Not on file  Other Topics Concern  . Not on file  Social History Narrative   Widowed 2000.    Family History  Problem Relation Age of Onset  . Dementia Mother   . Breast cancer Mother   . Heart attack Father   . Stroke Father     ROS: no fevers or chills, productive cough, hemoptysis, dysphasia, odynophagia, melena, hematochezia, dysuria, hematuria, rash, seizure activity, orthopnea, PND, pedal edema, claudication. Remaining systems are negative.  Physical Exam: Well-developed well-nourished elderly in no acute distress.  Skin is warm and dry.  HEENT is normal.  Neck is supple.  Chest is clear to auscultation with normal expansion.  Cardiovascular exam is regular rate and rhythm.  Abdominal exam nontender or distended. No masses palpated. Extremities show no edema. neuro grossly intact other than dementia   A/P  1 coronary artery disease-patient denies recurrent chest pain.  Plan medical therapy.  Continue aspirin and statin.  2 hypertension-patient's blood pressure is controlled.  Continue present medications.  3 hyperlipidemia-continue statin.  Kirk Ruths, MD

## 2018-03-05 ENCOUNTER — Encounter: Payer: Self-pay | Admitting: Cardiology

## 2018-03-05 ENCOUNTER — Ambulatory Visit: Payer: Medicare HMO | Admitting: Cardiology

## 2018-03-05 VITALS — BP 148/72 | HR 57 | Ht 63.5 in | Wt 155.8 lb

## 2018-03-05 DIAGNOSIS — I10 Essential (primary) hypertension: Secondary | ICD-10-CM

## 2018-03-05 DIAGNOSIS — I2510 Atherosclerotic heart disease of native coronary artery without angina pectoris: Secondary | ICD-10-CM

## 2018-03-05 DIAGNOSIS — E7800 Pure hypercholesterolemia, unspecified: Secondary | ICD-10-CM

## 2018-03-05 NOTE — Patient Instructions (Signed)
Your physician wants you to follow-up in: ONE YEAR WITH DR CRENSHAW You will receive a reminder letter in the mail two months in advance. If you don't receive a letter, please call our office to schedule the follow-up appointment.   If you need a refill on your cardiac medications before your next appointment, please call your pharmacy.  

## 2018-03-21 ENCOUNTER — Other Ambulatory Visit: Payer: Self-pay

## 2018-03-21 ENCOUNTER — Encounter: Payer: Self-pay | Admitting: Family Medicine

## 2018-03-21 ENCOUNTER — Ambulatory Visit (INDEPENDENT_AMBULATORY_CARE_PROVIDER_SITE_OTHER): Payer: Medicare HMO | Admitting: Family Medicine

## 2018-03-21 VITALS — BP 121/62 | HR 65 | Temp 98.0°F | Resp 17 | Ht 64.0 in | Wt 154.5 lb

## 2018-03-21 DIAGNOSIS — E7800 Pure hypercholesterolemia, unspecified: Secondary | ICD-10-CM | POA: Diagnosis not present

## 2018-03-21 DIAGNOSIS — F0390 Unspecified dementia without behavioral disturbance: Secondary | ICD-10-CM | POA: Diagnosis not present

## 2018-03-21 DIAGNOSIS — I10 Essential (primary) hypertension: Secondary | ICD-10-CM | POA: Diagnosis not present

## 2018-03-21 NOTE — Assessment & Plan Note (Signed)
Deteriorated.  Pt is not able to answer questions and is only oriented to person.  Offered daughter assistance w/ care- she declined at this time.  Will follow.

## 2018-03-21 NOTE — Progress Notes (Signed)
   Subjective:    Patient ID: Wanda Wright, female    DOB: [birth year], 84 y.o.   MRN: [id number]  HPI HTN- chronic problem, on Chlorthalidone 25mg  daily w/ good control.  No CP, SOB, visual changes, HAs, edema.  Hyperlipidemia- chronic problem, on Lipitor 20mg  daily.  No abd pain, N/V.  Dementia- chronic problem, on Aricept 10mg  daily.  Pt is not able to answer questions today w/o difficulty.  Continues to eat regularly, limited fluid intake.  Daughter doesn't feel she needs assistance in care giving.  Sleeping quite a bit.   Review of Systems For ROS see HPI     Objective:   Physical Exam  Constitutional: She appears well-developed and well-nourished. No distress.  HENT:  Head: Normocephalic and atraumatic.  Eyes: Pupils are equal, round, and reactive to light. Conjunctivae and EOM are normal.  Neck: Normal range of motion. Neck supple. No thyromegaly present.  Cardiovascular: Normal rate, regular rhythm, normal heart sounds and intact distal pulses.  No murmur heard. Pulmonary/Chest: Effort normal and breath sounds normal. No respiratory distress.  Abdominal: Soft. She exhibits no distension. There is no tenderness.  Musculoskeletal: She exhibits no edema.  Lymphadenopathy:    She has no cervical adenopathy.  Neurological: She is alert.  Oriented to person only.  Difficulty answering questions  Skin: Skin is warm and dry.  Psychiatric: She has a normal mood and affect. Her behavior is normal.  Vitals reviewed.         Assessment & Plan:

## 2018-03-21 NOTE — Addendum Note (Signed)
Addended bySigurd Sos on: 03/21/2018 03:44 PM   Modules accepted: Orders

## 2018-03-21 NOTE — Assessment & Plan Note (Signed)
Chronic problem.  Well controlled.  Asymptomatic today but unclear if pt really understands what I was asking.  Check labs.  No anticipated med changes.  Will follow.

## 2018-03-21 NOTE — Assessment & Plan Note (Signed)
Chronic problem.  On Lipitor 20mg daily.  Check labs.  Adjust meds prn  

## 2018-03-21 NOTE — Patient Instructions (Signed)
Schedule your complete physical in 6 months We'll notify you of your lab results and make any changes if needed If you find that you need help with her care, please let me know and we can explore resources Call with any questions or concerns Happy [place][birth year] [employer]!

## 2018-03-22 ENCOUNTER — Other Ambulatory Visit (INDEPENDENT_AMBULATORY_CARE_PROVIDER_SITE_OTHER): Payer: Medicare HMO

## 2018-03-22 DIAGNOSIS — E7800 Pure hypercholesterolemia, unspecified: Secondary | ICD-10-CM | POA: Diagnosis not present

## 2018-03-22 DIAGNOSIS — I10 Essential (primary) hypertension: Secondary | ICD-10-CM

## 2018-03-22 LAB — CBC WITH DIFFERENTIAL/PLATELET
BASOS ABS: 0 10*3/uL (ref 0.0–0.1)
Basophils Relative: 0.6 % (ref 0.0–3.0)
EOS ABS: 0.2 10*3/uL (ref 0.0–0.7)
Eosinophils Relative: 2.6 % (ref 0.0–5.0)
HEMATOCRIT: 43.4 % (ref 36.0–46.0)
HEMOGLOBIN: 14.1 g/dL (ref 12.0–15.0)
LYMPHS PCT: 22.1 % (ref 12.0–46.0)
Lymphs Abs: 1.4 10*3/uL (ref 0.7–4.0)
MCHC: 32.4 g/dL (ref 30.0–36.0)
MCV: 92.5 fl (ref 78.0–100.0)
Monocytes Absolute: 0.4 10*3/uL (ref 0.1–1.0)
Monocytes Relative: 5.6 % (ref 3.0–12.0)
Neutro Abs: 4.4 10*3/uL (ref 1.4–7.7)
Neutrophils Relative %: 69.1 % (ref 43.0–77.0)
PLATELETS: 205 10*3/uL (ref 150.0–400.0)
RBC: 4.69 Mil/uL (ref 3.87–5.11)
RDW: 14.3 % (ref 11.5–15.5)
WBC: 6.4 10*3/uL (ref 4.0–10.5)

## 2018-03-22 LAB — BASIC METABOLIC PANEL
BUN: 21 mg/dL (ref 6–23)
CHLORIDE: 103 meq/L (ref 96–112)
CO2: 33 mEq/L — ABNORMAL HIGH (ref 19–32)
Calcium: 9.9 mg/dL (ref 8.4–10.5)
Creatinine, Ser: 1.28 mg/dL — ABNORMAL HIGH (ref 0.40–1.20)
GFR: 41.92 mL/min — ABNORMAL LOW (ref >60.00)
Glucose, Bld: 107 mg/dL — ABNORMAL HIGH (ref 70–99)
Potassium: 4 mEq/L (ref 3.5–5.1)
Sodium: 142 mEq/L (ref 135–145)

## 2018-03-22 LAB — HEPATIC FUNCTION PANEL
ALT: 8 U/L (ref 0–35)
AST: 13 U/L (ref 0–37)
Albumin: 4.3 g/dL (ref 3.5–5.2)
Alkaline Phosphatase: 89 U/L (ref 39–117)
Bilirubin, Direct: 0.1 mg/dL (ref 0.0–0.3)
TOTAL PROTEIN: 7.4 g/dL (ref 6.0–8.3)
Total Bilirubin: 0.7 mg/dL (ref 0.2–1.2)

## 2018-03-22 LAB — LIPID PANEL
CHOLESTEROL: 210 mg/dL — AB (ref 0–200)
HDL: 55 mg/dL (ref >39.00)
LDL CALC: 118 mg/dL — AB (ref 0–99)
NonHDL: 154.88
TRIGLYCERIDES: 182 mg/dL — AB (ref 0.0–149.0)
Total CHOL/HDL Ratio: 4
VLDL: 36.4 mg/dL (ref 0.0–40.0)

## 2018-03-22 LAB — TSH: TSH: 1.89 u[IU]/mL (ref 0.35–4.50)

## 2018-03-25 ENCOUNTER — Other Ambulatory Visit: Payer: Self-pay | Admitting: Family Medicine

## 2018-03-25 DIAGNOSIS — R7989 Other specified abnormal findings of blood chemistry: Secondary | ICD-10-CM

## 2018-04-02 ENCOUNTER — Other Ambulatory Visit: Payer: Self-pay | Admitting: Family Medicine

## 2018-04-28 ENCOUNTER — Other Ambulatory Visit: Payer: Self-pay | Admitting: Family Medicine

## 2018-06-01 ENCOUNTER — Other Ambulatory Visit: Payer: Self-pay | Admitting: Family Medicine

## 2018-06-02 ENCOUNTER — Other Ambulatory Visit: Payer: Self-pay | Admitting: Family Medicine

## 2018-06-24 ENCOUNTER — Other Ambulatory Visit: Payer: Self-pay | Admitting: Family Medicine

## 2018-08-06 ENCOUNTER — Encounter (HOSPITAL_COMMUNITY): Payer: Self-pay

## 2018-08-06 ENCOUNTER — Other Ambulatory Visit: Payer: Self-pay

## 2018-08-06 ENCOUNTER — Emergency Department (HOSPITAL_COMMUNITY): Payer: Medicare HMO

## 2018-08-06 ENCOUNTER — Emergency Department (HOSPITAL_COMMUNITY)
Admission: EM | Admit: 2018-08-06 | Discharge: 2018-08-06 | Disposition: A | Discharge status: Home / Self Care | Payer: Medicare HMO | Attending: Emergency Medicine | Admitting: Emergency Medicine

## 2018-08-06 DIAGNOSIS — R404 Transient alteration of awareness: Secondary | ICD-10-CM | POA: Diagnosis not present

## 2018-08-06 DIAGNOSIS — W19XXXA Unspecified fall, initial encounter: Secondary | ICD-10-CM | POA: Insufficient documentation

## 2018-08-06 DIAGNOSIS — Z7982 Long term (current) use of aspirin: Secondary | ICD-10-CM | POA: Diagnosis not present

## 2018-08-06 DIAGNOSIS — Z79899 Other long term (current) drug therapy: Secondary | ICD-10-CM | POA: Insufficient documentation

## 2018-08-06 DIAGNOSIS — Z87891 Personal history of nicotine dependence: Secondary | ICD-10-CM | POA: Diagnosis not present

## 2018-08-06 DIAGNOSIS — E119 Type 2 diabetes mellitus without complications: Secondary | ICD-10-CM | POA: Diagnosis not present

## 2018-08-06 DIAGNOSIS — Z794 Long term (current) use of insulin: Secondary | ICD-10-CM | POA: Insufficient documentation

## 2018-08-06 DIAGNOSIS — I2510 Atherosclerotic heart disease of native coronary artery without angina pectoris: Secondary | ICD-10-CM | POA: Insufficient documentation

## 2018-08-06 DIAGNOSIS — I10 Essential (primary) hypertension: Secondary | ICD-10-CM | POA: Diagnosis not present

## 2018-08-06 DIAGNOSIS — Y939 Activity, unspecified: Secondary | ICD-10-CM | POA: Diagnosis not present

## 2018-08-06 DIAGNOSIS — Z951 Presence of aortocoronary bypass graft: Secondary | ICD-10-CM | POA: Insufficient documentation

## 2018-08-06 DIAGNOSIS — S0101XA Laceration without foreign body of scalp, initial encounter: Secondary | ICD-10-CM | POA: Diagnosis not present

## 2018-08-06 DIAGNOSIS — S0990XA Unspecified injury of head, initial encounter: Secondary | ICD-10-CM | POA: Diagnosis not present

## 2018-08-06 DIAGNOSIS — F0390 Unspecified dementia without behavioral disturbance: Secondary | ICD-10-CM | POA: Insufficient documentation

## 2018-08-06 DIAGNOSIS — Y92002 Bathroom of unspecified non-institutional (private) residence single-family (private) house as the place of occurrence of the external cause: Secondary | ICD-10-CM | POA: Insufficient documentation

## 2018-08-06 DIAGNOSIS — R58 Hemorrhage, not elsewhere classified: Secondary | ICD-10-CM | POA: Diagnosis not present

## 2018-08-06 DIAGNOSIS — Y999 Unspecified external cause status: Secondary | ICD-10-CM | POA: Diagnosis not present

## 2018-08-06 DIAGNOSIS — R51 Headache: Secondary | ICD-10-CM | POA: Diagnosis not present

## 2018-08-06 DIAGNOSIS — S199XXA Unspecified injury of neck, initial encounter: Secondary | ICD-10-CM | POA: Diagnosis not present

## 2018-08-06 MED ORDER — BACITRACIN ZINC 500 UNIT/GM EX OINT
TOPICAL_OINTMENT | Freq: Two times a day (BID) | CUTANEOUS | Status: DC
  Administered 2018-08-06: 1 via TOPICAL
  Filled 2018-08-06: qty 0.9

## 2018-08-06 MED ORDER — LIDOCAINE-EPINEPHRINE (PF) 2 %-1:200000 IJ SOLN
10.0000 mL | Freq: Once | INTRAMUSCULAR | Status: AC
  Administered 2018-08-06: 10 mL via INTRADERMAL
  Filled 2018-08-06: qty 10

## 2018-08-06 NOTE — ED Provider Notes (Signed)
[facility] DEPT Provider Note   CSN: [id number] Arrival date & time: 08/06/18  0750     History   Chief Complaint Chief Complaint  Patient presents with  . Fall  . Head Laceration    HPI Wanda Wright is a 84 y.o. female.  HPI   84 year old female with a history of coronary artery disease, carotid artery disease, diabetes, bradycardia, dementia, hyperlipidemia and hypertension, presents with concern for fall with head laceration.  Daughter reports that she heard her fall this morning in the bathroom, walked in immediately, and found her sitting on her knees as if she was praying.  No suspected loss of consciousness.  Patient has a history of dementia and history is limited, is unable to say how she fell.  Daughter noted bleeding from her scalp.  It is now hemostatic.  Patient denied headache, neck pain, chest pain, abdominal pain, leg pain.  No other change in mental mental status from baseline.  No other concerns, including no fever, vomiting, diarrhea, cough.  Past Medical History:  Diagnosis Date  . Allergic rhinitis   . Bradycardia    January, 2013  . CAD (coronary artery disease)    a. s/p CABG in 2004 b. cath in 2009 showing patent LIMA-LAD, patent SVG-RI, patent RCA, occluded LM  . Carotid artery disease ([place])    Doppler, October, 2011, 0-39% bilateral  . Colon polyp   . Colon polyps   . Diabetes mellitus without complication ([place])   . Ejection fraction    normal, catheterization, 2009  . Hearing loss   . Hemidiaphragm    elevated hemidiaphragm  . Hx of CABG    2004  . Hyperlipidemia   . Hypertension   . Osteoarthritis cervical spine     Patient Active Problem List   Diagnosis Date Noted  . Dementia ([place]) 10/30/2016  . Cerumen impaction 09/08/2014  . Shoulder arthritis 10/20/2013  . Sciatica of right side 10/01/2013  . Routine general medical examination at a health care facility 01/03/2013  . Keratosis, seborrheic  03/28/2012  . Breast pain 02/14/2012  . Cherry angioma 12/05/2011  . Insomnia 12/05/2011  . Neuropathy of leg 09/05/2011  . Bradycardia   . CAD (coronary artery disease)   . Hyperlipidemia   . Hypertension   . Hemidiaphragm   . Carotid artery disease ([place])   . Hx of CABG   . Ejection fraction   . [place] 04/12/2011  . Constipation 04/06/2011  . OTHER&UNSPECIFIED DISEASES THE ORAL SOFT TISSUES 08/17/2010  . NEOPLASM, SKIN, UNCERTAIN BEHAVIOR 92/42/6834  . GAIT IMBALANCE 08/10/2010  . DYSPHAGIA 08/10/2010  . DEPRESSIVE DISORDER 06/13/2010  . COLONIC POLYPS 11/13/2008  . SEBACEOUS CYST, SCALP 10/05/2008  . Osteoarthritis of spine 10/05/2008  . ALLERGIC RHINITIS 03/14/2007  . Osteoporosis 03/14/2007    Past Surgical History:  Procedure Laterality Date  . BREAST LUMPECTOMY     multiple, all benign  . CATARACT EXTRACTION Bilateral   . CHOLECYSTECTOMY    . COLONOSCOPY W/ POLYPECTOMY    . CORONARY ARTERY BYPASS GRAFT     ACS  . HIATAL HERNIA REPAIR    . lid lift Bilateral   . TOTAL ABDOMINAL HYSTERECTOMY       OB History   No obstetric history on file.      Home Medications    Prior to Admission medications   Medication Sig Start Date End Date Taking? Authorizing Provider  acetaminophen (TYLENOL) 500 MG tablet Take 1,000 mg by mouth 2 (  two) times daily.    Yes [provider]  aspirin 81 MG tablet Take 81 mg by mouth daily.     Yes [provider]  atorvastatin (LIPITOR) 20 MG tablet TAKE 1 TABLET BY MOUTH DAILY AS DIRECTED Patient taking differently: Take 10 mg by mouth daily.  06/03/18  Yes Midge Minium, MD  Calcium Carbonate-Vitamin D 600-400 MG-UNIT per tablet Take 2 tablets by mouth daily.   Yes [provider]  chlorthalidone (HYGROTON) 25 MG tablet TAKE AS DIRECTED Patient taking differently: Take 12.5 mg by mouth daily.  06/24/18  Yes Midge Minium, MD  cholecalciferol (VITAMIN D) 1000 UNITS tablet Take 1,000 Units by  mouth daily.   Yes [provider]  docusate sodium (COLACE) 100 MG capsule TAKE ONE CAPSULE BY MOUTH EVERY 12 HOURS Patient taking differently: Take 100 mg by mouth every 12 (twelve) hours.  04/29/18  Yes Midge Minium, MD  donepezil (ARICEPT) 10 MG tablet TAKE 1 TABLET (10 MG TOTAL) BY MOUTH AT BEDTIME. Patient taking differently: Take 10 mg by mouth at bedtime.  06/03/18  Yes Midge Minium, MD  Inulin (FIBER CHOICE) 1.5 g CHEW Chew 1 each by mouth daily.   Yes [provider]  KLOR-CON 10 10 MEQ tablet TAKE 1 TABLET BY MOUTH TWICE A DAY Patient taking differently: Take 20 mEq by mouth at bedtime.  04/29/18  Yes Midge Minium, MD  loratadine (CLARITIN) 10 MG tablet Take 10 mg by mouth daily.   Yes [provider]  nitroGLYCERIN (NITROSTAT) 0.4 MG SL tablet Place 1 tablet (0.4 mg total) under the tongue every 5 (five) minutes as needed for chest pain. 09/10/15  Yes Lelon Perla, MD  polyethylene glycol (MIRALAX / GLYCOLAX) packet Take 17 g by mouth at bedtime.    Yes [provider]  fluticasone (FLONASE) 50 MCG/ACT nasal spray Place 2 sprays into both nostrils daily. Patient not taking: Reported on 08/06/2018 05/25/14   Midge Minium, MD  gabapentin (NEURONTIN) 100 MG capsule TAKE 1 CAPSULE BY MOUTH 3 TIMES DAILY AS NEEDED. Patient not taking: Reported on 03/21/2018 07/30/17   Midge Minium, MD  traMADol (ULTRAM) 50 MG tablet TAKE 1 TABLET BY MOUTH EVERY 6 HOURS AS NEEDED FOR PAIN Patient not taking: Reported on 03/21/2018 11/05/17   Midge Minium, MD    Family History Family History  Problem Relation Age of Onset  . Dementia Mother   . Breast cancer Mother   . Heart attack Father   . Stroke Father     Social History Social History   Tobacco Use  . Smoking status: Former Smoker    Types: Cigarettes    Last attempt to quit: 08/07/1948    Years since quitting: 70.0  . Smokeless tobacco: Never Used  Substance Use  Topics  . Alcohol use: No    Alcohol/week: 0.0 standard drinks  . Drug use: No     Allergies   Codeine; Horse-derived products; and Tetanus toxoid   Review of Systems Review of Systems  Unable to perform ROS: Dementia  Constitutional: Negative for fever.  Respiratory: Negative for cough and shortness of breath.   Gastrointestinal: Negative for abdominal pain, nausea and vomiting.  Genitourinary: Negative for dysuria.  Musculoskeletal: Negative for neck pain.  Skin: Positive for wound.  Neurological: Negative for syncope, weakness, numbness and headaches.     Physical Exam Updated Vital Signs BP (!) 139/59   Pulse (!) 48   Temp (!)  97.5 F (36.4 C)   Resp 12   SpO2 98%   Physical Exam Vitals signs and nursing note reviewed.  Constitutional:      General: She is not in acute distress.    Appearance: She is well-developed. She is not diaphoretic.  HENT:     Head: Normocephalic.     Comments: 4cm laceration left superior parietal area Eyes:     Conjunctiva/sclera: Conjunctivae normal.  Neck:     Musculoskeletal: Normal range of motion.  Cardiovascular:     Rate and Rhythm: Normal rate and regular rhythm.     Heart sounds: Normal heart sounds. No murmur. No friction rub. No gallop.   Pulmonary:     Effort: Pulmonary effort is normal. No respiratory distress.     Breath sounds: Normal breath sounds. No wheezing or rales.  Abdominal:     General: There is no distension.     Palpations: Abdomen is soft.     Tenderness: There is no abdominal tenderness. There is no guarding.  Musculoskeletal:        General: No tenderness.     Cervical back: She exhibits no bony tenderness.     Thoracic back: She exhibits no bony tenderness.     Lumbar back: She exhibits no bony tenderness.  Skin:    General: Skin is warm and dry.     Findings: No erythema or rash.  Neurological:     Mental Status: She is alert and oriented to person, place, and time.      ED Treatments /  Results  Labs (all labs ordered are listed, but only abnormal results are displayed) Labs Reviewed - No data to display  EKG None  Radiology Ct Head Wo Contrast  Result Date: 08/06/2018 CLINICAL DATA:  Posttraumatic headache and laceration after fall. EXAM: CT HEAD WITHOUT CONTRAST CT CERVICAL SPINE WITHOUT CONTRAST TECHNIQUE: Multidetector CT imaging of the head and cervical spine was performed following the standard protocol without intravenous contrast. Multiplanar CT image reconstructions of the cervical spine were also generated. COMPARISON:  MRI of Jan 05, 2016. FINDINGS: CT HEAD FINDINGS Brain: Mild diffuse cortical atrophy is noted. Mild chronic ischemic white matter disease is noted. No mass effect or midline shift is noted. Ventricular size is within normal limits. There is no evidence of mass lesion, hemorrhage or acute infarction. Vascular: No hyperdense vessel or unexpected calcification. Skull: Normal. Negative for fracture or focal lesion. Sinuses/Orbits: No acute finding. Other: None. CT CERVICAL SPINE FINDINGS Alignment: Normal. Skull base and vertebrae: No acute fracture. No primary bone lesion or focal pathologic process. Soft tissues and spinal canal: No prevertebral fluid or swelling. No visible canal hematoma. Disc levels:  Moderate degenerative disc disease is noted at C5-6. Upper chest: Negative. Other: Degenerative changes are seen involving posterior facet joints bilaterally. IMPRESSION: Mild diffuse cortical atrophy. Mild chronic ischemic white matter disease. No acute intracranial abnormality seen. Moderate degenerative disc disease is noted at C5-6. No acute abnormality seen in the cervical spine. Electronically Signed   By: Marijo Conception, M.D.   On: 08/06/2018 09:18   Ct Cervical Spine Wo Contrast  Result Date: 08/06/2018 CLINICAL DATA:  Posttraumatic headache and laceration after fall. EXAM: CT HEAD WITHOUT CONTRAST CT CERVICAL SPINE WITHOUT CONTRAST TECHNIQUE:  Multidetector CT imaging of the head and cervical spine was performed following the standard protocol without intravenous contrast. Multiplanar CT image reconstructions of the cervical spine were also generated. COMPARISON:  MRI of Jan 05, 2016. FINDINGS: CT HEAD  FINDINGS Brain: Mild diffuse cortical atrophy is noted. Mild chronic ischemic white matter disease is noted. No mass effect or midline shift is noted. Ventricular size is within normal limits. There is no evidence of mass lesion, hemorrhage or acute infarction. Vascular: No hyperdense vessel or unexpected calcification. Skull: Normal. Negative for fracture or focal lesion. Sinuses/Orbits: No acute finding. Other: None. CT CERVICAL SPINE FINDINGS Alignment: Normal. Skull base and vertebrae: No acute fracture. No primary bone lesion or focal pathologic process. Soft tissues and spinal canal: No prevertebral fluid or swelling. No visible canal hematoma. Disc levels:  Moderate degenerative disc disease is noted at C5-6. Upper chest: Negative. Other: Degenerative changes are seen involving posterior facet joints bilaterally. IMPRESSION: Mild diffuse cortical atrophy. Mild chronic ischemic white matter disease. No acute intracranial abnormality seen. Moderate degenerative disc disease is noted at C5-6. No acute abnormality seen in the cervical spine. Electronically Signed   By: Marijo Conception, M.D.   On: 08/06/2018 09:18    Procedures Procedures (including critical care time)  Medications Ordered in ED Medications  lidocaine-EPINEPHrine (XYLOCAINE W/EPI) 2 %-1:200000 (PF) injection 10 mL (10 mLs Intradermal Given 08/06/18 0940)     Initial Impression / Assessment and Plan / ED Course  I have reviewed the triage vital signs and the nursing notes.  Pertinent labs & imaging results that were available during my care of the patient were reviewed by me and considered in my medical decision making (see chart for details).     84 year old female with  a history of coronary artery disease, carotid artery disease, diabetes, bradycardia, dementia, hyperlipidemia and hypertension, presents with concern for fall with head laceration. No other acute changes or concerns. No LOC.  CT head and CSPine without acute findings. Laceration repaired by resident. Patient discharged in stable condition with understanding of reasons to return.   Final Clinical Impressions(s) / ED Diagnoses   Final diagnoses:  Laceration of scalp, initial encounter  Fall, initial encounter    ED Discharge Orders    None       Gareth Morgan, MD 08/06/18 [phone]

## 2018-08-06 NOTE — ED Provider Notes (Signed)
  Physical Exam  BP (!) 136/54   Pulse (!) 49   Temp (!) 97.5 F (36.4 C)   Resp 16   SpO2 98%   Physical Exam  ED Course/Procedures     .Marland KitchenLaceration Repair Date/Time: 08/06/2018 9:53 AM Performed by: Bonnita Hollow, MD Authorized by: Gareth Morgan, MD   Consent:    Consent obtained:  Verbal   Consent given by:  Patient   Risks discussed:  Infection and poor cosmetic result   Alternatives discussed:  No treatment Anesthesia (see MAR for exact dosages):    Anesthesia method:  Local infiltration   Local anesthetic:  Lidocaine 1% WITH epi Laceration details:    Location:  Scalp   Scalp location:  L temporal   Length (cm):  5   Depth (mm):  2 Repair type:    Repair type:  Simple Pre-procedure details:    Preparation:  Patient was prepped and draped in usual sterile fashion Exploration:    Hemostasis achieved with:  Epinephrine   Wound exploration: entire depth of wound probed and visualized     Wound extent: areolar tissue violated     Contaminated: no   Treatment:    Area cleansed with:  Betadine   Amount of cleaning:  Standard   Irrigation solution:  Sterile saline   Irrigation method:  Syringe   Visualized foreign bodies/material removed: no   Skin repair:    Repair method:  Staples   Number of staples:  5 Approximation:    Approximation:  Close Post-procedure details:    Dressing:  Open (no dressing)   Patient tolerance of procedure:  Tolerated well, no immediate complications    MDM         Bonnita Hollow, MD 08/06/18 [id number]    Gareth Morgan, MD 08/06/18 2006

## 2018-08-06 NOTE — ED Notes (Signed)
Bed: WA03 Expected date:  Expected time:  Means of arrival:  Comments: 84 yo f, fall

## 2018-08-06 NOTE — ED Notes (Signed)
Head lac dressed with bacitracin, xeroform dressing, then wrapped in gauze.  Also cleaned pt's face and head

## 2018-08-06 NOTE — ED Triage Notes (Signed)
Per EMS: Pt fell this am in the bathroom.  Daughter heard her fall and called EMS. Pt hit her head, and has a head lac.  Pt is not in blood thinners.  Daughter states pt is normally confused.

## 2018-08-14 ENCOUNTER — Ambulatory Visit (INDEPENDENT_AMBULATORY_CARE_PROVIDER_SITE_OTHER): Payer: 59 | Admitting: Family Medicine

## 2018-08-14 ENCOUNTER — Encounter: Payer: Self-pay | Admitting: Family Medicine

## 2018-08-14 ENCOUNTER — Other Ambulatory Visit: Payer: Self-pay

## 2018-08-14 VITALS — BP 136/78 | HR 111 | Temp 98.1°F | Resp 16 | Ht 64.0 in | Wt 157.2 lb

## 2018-08-14 DIAGNOSIS — S0101XD Laceration without foreign body of scalp, subsequent encounter: Secondary | ICD-10-CM

## 2018-08-14 DIAGNOSIS — Z4802 Encounter for removal of sutures: Secondary | ICD-10-CM

## 2018-08-14 NOTE — Progress Notes (Signed)
   Subjective:    Patient ID: Wanda Wright, female    DOB: 1928/09/12, 85 y.o.   MRN: [id number]  HPI ER f/u- pt was treated in ER for scalp laceration on 12/31 after falling in the bathroom.  Mechanism of fall and injury are unclear as pt has dementia and fall was not witnessed.  Here today for staple removal.  Pt has no recollection of the fall or injury.     Review of Systems For ROS see HPI     Objective:   Physical Exam Vitals signs reviewed.  Constitutional:      Appearance: Normal appearance.  HENT:     Head:     Comments: 6 staples removed from L anterior scalp w/o difficulty.  Pt tolerated procedure.  No oozing or bleeding from site Neurological:     Mental Status: She is alert. Mental status is at baseline.           Assessment & Plan:  Scalp lac- well healing, staples removed w/o difficulty.  Pt and daughter still have no idea how pt fell or mechanism of injury.  Pt has no recollection of falling or laceration repair.  Pt tolerated removal w/o difficulty.  Reviewed wound care instructions w/ pt's daughter.

## 2018-08-14 NOTE — Patient Instructions (Signed)
Follow up as needed or as scheduled Keep area clean and dry It is ok to wash hair- pat dry Try and avoid picking the scab Call with any questions or concerns Happy New Year!

## 2018-08-15 ENCOUNTER — Emergency Department (HOSPITAL_BASED_OUTPATIENT_CLINIC_OR_DEPARTMENT_OTHER)
Admission: EM | Admit: 2018-08-15 | Discharge: 2018-08-15 | Disposition: A | Discharge status: Home / Self Care | Payer: Medicare HMO | Attending: Emergency Medicine | Admitting: Emergency Medicine

## 2018-08-15 ENCOUNTER — Encounter (HOSPITAL_BASED_OUTPATIENT_CLINIC_OR_DEPARTMENT_OTHER): Payer: Self-pay

## 2018-08-15 DIAGNOSIS — E785 Hyperlipidemia, unspecified: Secondary | ICD-10-CM | POA: Diagnosis not present

## 2018-08-15 DIAGNOSIS — F0390 Unspecified dementia without behavioral disturbance: Secondary | ICD-10-CM | POA: Insufficient documentation

## 2018-08-15 DIAGNOSIS — Z79899 Other long term (current) drug therapy: Secondary | ICD-10-CM | POA: Diagnosis not present

## 2018-08-15 DIAGNOSIS — I2510 Atherosclerotic heart disease of native coronary artery without angina pectoris: Secondary | ICD-10-CM | POA: Diagnosis not present

## 2018-08-15 DIAGNOSIS — Z87891 Personal history of nicotine dependence: Secondary | ICD-10-CM | POA: Diagnosis not present

## 2018-08-15 DIAGNOSIS — I10 Essential (primary) hypertension: Secondary | ICD-10-CM | POA: Insufficient documentation

## 2018-08-15 DIAGNOSIS — R0981 Nasal congestion: Secondary | ICD-10-CM | POA: Diagnosis present

## 2018-08-15 DIAGNOSIS — H66011 Acute suppurative otitis media with spontaneous rupture of ear drum, right ear: Secondary | ICD-10-CM | POA: Diagnosis not present

## 2018-08-15 DIAGNOSIS — G4489 Other headache syndrome: Secondary | ICD-10-CM | POA: Diagnosis not present

## 2018-08-15 HISTORY — DX: Unspecified dementia, unspecified severity, without behavioral disturbance, psychotic disturbance, mood disturbance, and anxiety: F03.90

## 2018-08-15 MED ORDER — AMOXICILLIN 500 MG PO CAPS: 1000.0000 mg | ORAL_CAPSULE | Freq: Two times a day (BID) | ORAL | 0 refills | Status: DC

## 2018-08-15 MED ORDER — AMOXICILLIN 500 MG PO CAPS
1000.0000 mg | ORAL_CAPSULE | Freq: Once | ORAL | Status: AC
  Administered 2018-08-15: 1000 mg via ORAL
  Filled 2018-08-15: qty 2

## 2018-08-15 NOTE — ED Provider Notes (Signed)
[facility]PT [facility] Provider Note: Georgena Spurling, MD, FACEP  CSN: [id number] MRN: [id number] ARRIVAL: 08/15/18 at [place]: [place]  Tinnitus  Level 5 caveat: Dementia HISTORY OF PRESENT ILLNESS  08/15/18 5:12 AM Wanda Wright is a 85 y.o. female with recent cold symptoms, specifically nasal congestion and sneezing.  She was sent via EMS by her daughter for complaints of "my right ear is talking to me".  She is unable to specify exactly what she means by this.  She also acts like there is some pain associated with this in her right ear.  She denies a headache to me.  She was afebrile on arrival.  5:16 AM The daughter is now present at the bedside and states the patient was stating earlier "it is going to explode" and complaining of pain in the right ear.    Past Medical History:  Diagnosis Date  . Allergic rhinitis   . Bradycardia    January, 2013  . CAD (coronary artery disease)    a. s/p CABG in 2004 b. cath in 2009 showing patent LIMA-LAD, patent SVG-RI, patent RCA, occluded LM  . Carotid artery disease ([place])    Doppler, October, 2011, 0-39% bilateral  . Colon polyps   . Dementia ([place])   . Diabetes mellitus without complication ([place])   . Hearing loss   . Hemidiaphragm    elevated hemidiaphragm  . Hyperlipidemia   . Hypertension   . Osteoarthritis cervical spine     Past Surgical History:  Procedure Laterality Date  . BREAST LUMPECTOMY     multiple, all benign  . CATARACT EXTRACTION Bilateral   . CHOLECYSTECTOMY    . COLONOSCOPY W/ POLYPECTOMY    . CORONARY ARTERY BYPASS GRAFT     ACS  . HIATAL HERNIA REPAIR    . lid lift Bilateral   . TOTAL ABDOMINAL HYSTERECTOMY      Family History  Problem Relation Age of Onset  . Dementia Mother   . Breast cancer Mother   . Heart attack Father   . Stroke Father     Social History   Tobacco Use  . Smoking status: Former Smoker    Types: Cigarettes    Last attempt to quit: 08/07/1948   Years since quitting: 70.0  . Smokeless tobacco: Never Used  Substance Use Topics  . Alcohol use: No    Alcohol/week: 0.0 standard drinks  . Drug use: No    Prior to Admission medications   Medication Sig Start Date End Date Taking? Authorizing Provider  acetaminophen (TYLENOL) 500 MG tablet Take 1,000 mg by mouth 2 (two) times daily.     [provider]  aspirin 81 MG tablet Take 81 mg by mouth daily.      [provider]  atorvastatin (LIPITOR) 20 MG tablet TAKE 1 TABLET BY MOUTH DAILY AS DIRECTED Patient taking differently: Take 10 mg by mouth daily.  06/03/18   Midge Minium, MD  Calcium Carbonate-Vitamin D 600-400 MG-UNIT per tablet Take 2 tablets by mouth daily.    [provider]  chlorthalidone (HYGROTON) 25 MG tablet TAKE AS DIRECTED Patient taking differently: Take 12.5 mg by mouth daily.  06/24/18   Midge Minium, MD  cholecalciferol (VITAMIN D) 1000 UNITS tablet Take 1,000 Units by mouth daily.    [provider]  docusate sodium (COLACE) 100 MG capsule TAKE ONE CAPSULE BY MOUTH EVERY 12 HOURS Patient taking differently: Take 100 mg by mouth every  12 (twelve) hours.  04/29/18   Midge Minium, MD  donepezil (ARICEPT) 10 MG tablet TAKE 1 TABLET (10 MG TOTAL) BY MOUTH AT BEDTIME. Patient taking differently: Take 10 mg by mouth at bedtime.  06/03/18   Midge Minium, MD  fluticasone (FLONASE) 50 MCG/ACT nasal spray Place 2 sprays into both nostrils daily. 05/25/14   Midge Minium, MD  gabapentin (NEURONTIN) 100 MG capsule TAKE 1 CAPSULE BY MOUTH 3 TIMES DAILY AS NEEDED. 07/30/17   Midge Minium, MD  Inulin (FIBER CHOICE) 1.5 g CHEW Chew 1 each by mouth daily.    [provider]  KLOR-CON 10 10 MEQ tablet TAKE 1 TABLET BY MOUTH TWICE A DAY Patient taking differently: Take 20 mEq by mouth at bedtime.  04/29/18   Midge Minium, MD  loratadine (CLARITIN) 10 MG tablet Take 10 mg by mouth daily.     [provider]  nitroGLYCERIN (NITROSTAT) 0.4 MG SL tablet Place 1 tablet (0.4 mg total) under the tongue every 5 (five) minutes as needed for chest pain. 09/10/15   Lelon Perla, MD  polyethylene glycol (MIRALAX / GLYCOLAX) packet Take 17 g by mouth at bedtime.     [provider]  traMADol (ULTRAM) 50 MG tablet TAKE 1 TABLET BY MOUTH EVERY 6 HOURS AS NEEDED FOR PAIN 11/05/17   Midge Minium, MD    Allergies Codeine; Horse-derived products; and Tetanus toxoid   REVIEW OF SYSTEMS     PHYSICAL EXAMINATION  Initial Vital Signs Blood pressure (!) 191/68, pulse (!) 53, temperature 97.6 F (36.4 C), temperature source Oral, resp. rate 18, SpO2 97 %.  Examination General: Well-developed, well-nourished female in no acute distress; appearance consistent with age of record HENT: normocephalic; atraumatic; left TM normal; right TM obscured by blood and fluid Eyes: pupils equal, round and reactive to light; extraocular muscles grossly intact Neck: supple Heart: regular rate and rhythm Lungs: clear to auscultation bilaterally Abdomen: soft; nondistended; nontender; no masses or hepatosplenomegaly; bowel sounds present Extremities: No deformity; full range of motion; pulses normal Neurologic: Awake, alert, and fused; motor function intact in all extremities and symmetric; no facial droop Skin: Warm and dry   RESULTS  Summary of this visit's results, reviewed by myself:   EKG Interpretation  Date/Time:    Ventricular Rate:    PR Interval:    QRS Duration:   QT Interval:    QTC Calculation:   R Axis:     Text Interpretation:        Laboratory Studies: No results found for this or any previous visit (from the past 24 hour(s)). Imaging Studies: No results found.  ED COURSE and MDM  Nursing notes and initial vitals signs, including pulse oximetry, reviewed.  Vitals:   08/15/18 0505  BP: (!) 191/68  Pulse: (!) 53  Resp: 18  Temp: 97.6 F (36.4 C)    TempSrc: Oral  SpO2: 97%   Examination consistent with ruptured tympanic membrane. Although the TM itself cannot be seen this would explain the blood and fluid in the external auditory canal.  The patient has an ENT physician, Melida Quitter, with whom she can follow-up.  PROCEDURES    ED DIAGNOSES     ICD-10-CM   1. Non-recurrent acute suppurative otitis media of right ear with spontaneous rupture of tympanic membrane H66.011        Yuan Gann, Jenny Reichmann, MD 08/15/18 [phone]

## 2018-08-15 NOTE — ED Notes (Signed)
PT states understanding of care given, follow up care, and medication prescribed. PT ambulated from ED to car with a steady gait. 

## 2018-08-15 NOTE — ED Triage Notes (Signed)
Pt c/o ringing in the ear

## 2018-08-16 ENCOUNTER — Telehealth: Payer: Self-pay | Admitting: Family Medicine

## 2018-08-16 NOTE — Telephone Encounter (Signed)
Spoke with patient daughter. Advised OK to wash hair, but to use low heat setting with hair dryer and pat head/hair dry.

## 2018-08-16 NOTE — Telephone Encounter (Signed)
Copied from [place] [phone]. Topic: General - Inquiry >> Aug 16, 2018  3:52 PM Sheran Luz wrote: Reason for CRM: Patient's daughter, Neoma Laming, calling to inquire if patient is able to wash her hair yet. Patient had staples removed on 01/8. Please advise.   Neoma Laming cb# [phone]

## 2018-08-22 DIAGNOSIS — H66011 Acute suppurative otitis media with spontaneous rupture of ear drum, right ear: Secondary | ICD-10-CM | POA: Diagnosis not present

## 2018-08-27 ENCOUNTER — Other Ambulatory Visit: Payer: Self-pay | Admitting: Family Medicine

## 2018-09-26 ENCOUNTER — Encounter: Payer: Medicare HMO | Admitting: Family Medicine

## 2018-09-26 ENCOUNTER — Other Ambulatory Visit: Payer: Self-pay | Admitting: Family Medicine

## 2018-09-26 ENCOUNTER — Ambulatory Visit: Payer: Medicare HMO

## 2018-09-28 ENCOUNTER — Other Ambulatory Visit: Payer: Self-pay | Admitting: Family Medicine

## 2018-10-02 DIAGNOSIS — H353131 Nonexudative age-related macular degeneration, bilateral, early dry stage: Secondary | ICD-10-CM | POA: Diagnosis not present

## 2018-10-02 DIAGNOSIS — H35372 Puckering of macula, left eye: Secondary | ICD-10-CM | POA: Diagnosis not present

## 2018-10-24 ENCOUNTER — Other Ambulatory Visit: Payer: Self-pay | Admitting: Family Medicine

## 2018-11-26 ENCOUNTER — Other Ambulatory Visit: Payer: Self-pay | Admitting: Family Medicine

## 2018-11-29 ENCOUNTER — Other Ambulatory Visit: Payer: Self-pay | Admitting: Family Medicine

## 2019-02-05 ENCOUNTER — Encounter: Payer: Medicare HMO | Admitting: Family Medicine

## 2019-02-05 ENCOUNTER — Ambulatory Visit: Payer: Medicare HMO

## 2019-02-12 ENCOUNTER — Telehealth: Payer: Self-pay | Admitting: *Deleted

## 2019-02-12 NOTE — Telephone Encounter (Signed)
Mrs.Gilroy's daughter ask if we could call  Back later.

## 2019-02-13 ENCOUNTER — Ambulatory Visit (INDEPENDENT_AMBULATORY_CARE_PROVIDER_SITE_OTHER): Payer: Medicare HMO | Admitting: Family Medicine

## 2019-02-13 ENCOUNTER — Other Ambulatory Visit: Payer: Self-pay

## 2019-02-13 ENCOUNTER — Encounter: Payer: Self-pay | Admitting: Family Medicine

## 2019-02-13 VITALS — BP 137/81 | HR 60 | Temp 97.0°F | Resp 16 | Ht 64.0 in | Wt 154.1 lb

## 2019-02-13 DIAGNOSIS — M810 Age-related osteoporosis without current pathological fracture: Secondary | ICD-10-CM

## 2019-02-13 DIAGNOSIS — F0390 Unspecified dementia without behavioral disturbance: Secondary | ICD-10-CM | POA: Diagnosis not present

## 2019-02-13 DIAGNOSIS — Z0000 Encounter for general adult medical examination without abnormal findings: Secondary | ICD-10-CM

## 2019-02-13 DIAGNOSIS — I10 Essential (primary) hypertension: Secondary | ICD-10-CM | POA: Diagnosis not present

## 2019-02-13 DIAGNOSIS — L600 Ingrowing nail: Secondary | ICD-10-CM | POA: Diagnosis not present

## 2019-02-13 LAB — BASIC METABOLIC PANEL
BUN: 19 mg/dL (ref 6–23)
CO2: 30 mEq/L (ref 19–32)
Calcium: 9.3 mg/dL (ref 8.4–10.5)
Chloride: 103 mEq/L (ref 96–112)
Creatinine, Ser: 1.01 mg/dL (ref 0.40–1.20)
GFR: 51.74 mL/min — ABNORMAL LOW (ref >60.00)
Glucose, Bld: 107 mg/dL — ABNORMAL HIGH (ref 70–99)
Potassium: 4 mEq/L (ref 3.5–5.1)
Sodium: 142 mEq/L (ref 135–145)

## 2019-02-13 LAB — LIPID PANEL
Cholesterol: 173 mg/dL (ref 0–200)
HDL: 48.9 mg/dL (ref >39.00)
NonHDL: 124.02
Total CHOL/HDL Ratio: 4
Triglycerides: 231 mg/dL — ABNORMAL HIGH (ref 0.0–149.0)
VLDL: 46.2 mg/dL — ABNORMAL HIGH (ref 0.0–40.0)

## 2019-02-13 LAB — CBC WITH DIFFERENTIAL/PLATELET
Basophils Absolute: 0.1 10*3/uL (ref 0.0–0.1)
Basophils Relative: 0.9 % (ref 0.0–3.0)
Eosinophils Absolute: 0.2 10*3/uL (ref 0.0–0.7)
Eosinophils Relative: 3.9 % (ref 0.0–5.0)
HCT: 41.1 % (ref 36.0–46.0)
Hemoglobin: 13.5 g/dL (ref 12.0–15.0)
Lymphocytes Relative: 17.5 % (ref 12.0–46.0)
Lymphs Abs: 1 10*3/uL (ref 0.7–4.0)
MCHC: 32.8 g/dL (ref 30.0–36.0)
MCV: 93.2 fl (ref 78.0–100.0)
Monocytes Absolute: 0.4 10*3/uL (ref 0.1–1.0)
Monocytes Relative: 7.5 % (ref 3.0–12.0)
Neutro Abs: 4.1 10*3/uL (ref 1.4–7.7)
Neutrophils Relative %: 70.2 % (ref 43.0–77.0)
Platelets: 207 10*3/uL (ref 150.0–400.0)
RBC: 4.41 Mil/uL (ref 3.87–5.11)
RDW: 13.9 % (ref 11.5–15.5)
WBC: 5.9 10*3/uL (ref 4.0–10.5)

## 2019-02-13 LAB — HEPATIC FUNCTION PANEL
ALT: 11 U/L (ref 0–35)
AST: 12 U/L (ref 0–37)
Albumin: 4.3 g/dL (ref 3.5–5.2)
Alkaline Phosphatase: 92 U/L (ref 39–117)
Bilirubin, Direct: 0.1 mg/dL (ref 0.0–0.3)
Total Bilirubin: 0.6 mg/dL (ref 0.2–1.2)
Total Protein: 6.7 g/dL (ref 6.0–8.3)

## 2019-02-13 LAB — VITAMIN D 25 HYDROXY (VIT D DEFICIENCY, FRACTURES): VITD: 31.05 ng/mL (ref 30.00–100.00)

## 2019-02-13 LAB — LDL CHOLESTEROL, DIRECT: Direct LDL: 98 mg/dL

## 2019-02-13 LAB — TSH: TSH: 1.99 u[IU]/mL (ref 0.35–4.50)

## 2019-02-13 NOTE — Patient Instructions (Signed)
Follow up in 6 months to recheck BP and cholesterol We'll notify you of your lab results and make any changes if needed We'll call you with your podiatry appt for the toenails Call with any questions or concerns Stay Safe!!

## 2019-02-13 NOTE — Assessment & Plan Note (Signed)
Deteriorated.  Pt is unable to answer questions or follow directions.  Daughter firmly feels that mother wants to be a DNR and will look for copy of her living will.

## 2019-02-13 NOTE — Assessment & Plan Note (Signed)
Check Vit D level and replete prn. 

## 2019-02-13 NOTE — Assessment & Plan Note (Signed)
Pt's PE unchanged w/ exception of worsening dementia.  She is no longer participating in health maintenance per her daughter.  Family still wants her to have labs.  Anticipatory guidance provided.

## 2019-02-13 NOTE — Assessment & Plan Note (Signed)
Chronic problem.  well controlled.  Will continue to follow.

## 2019-02-13 NOTE — Progress Notes (Signed)
   Subjective:    Patient ID: Wanda Wright, female    DOB: [birth year], 85 y.o.   MRN: [id number]  HPI CPE- pt is no longer doing colon cancer screening, mammos, DEXA.  UTD on immunizations.  Daughter reports things are 'pretty good'.  Pt is not able to answer due to dementia.  Pt will have rare behavioral disturbances w/ daughter.  Pt's only complaint is shoulder pain.  Per pt's daughter she has a living will but daughter can't find it.  She knows mom wants to be a DNR.   Review of Systems Unable to obtain due to dementia. Daughter reports bilateral ingrown toenails Otherwise negative    Objective:   Physical Exam General Appearance:    No distress, appears stated age  Head:    Normocephalic, without obvious abnormality, atraumatic  Eyes:    PERRL, conjunctiva/corneas clear, EOM's intact, fundi    benign, both eyes  Ears:    Normal TM's and external ear canals, both ears  Nose:   Deferred due to COVID  Throat:   Neck:   Supple, symmetrical, trachea midline, no adenopathy;    Thyroid: no enlargement/tenderness/nodules  Back:     Symmetric, ROM normal, no CVA tenderness  Lungs:     Clear to auscultation bilaterally, respirations unlabored  Chest Wall:    No tenderness or deformity   Heart:    Regular rate and rhythm, S1 and S2 normal, no murmur, rub   or gallop  Breast Exam:    Deferred  Abdomen:     Soft, non-tender, bowel sounds active all four quadrants,    no masses, no organomegaly  Genitalia:    Deferred  Rectal:    Extremities:   Extremities normal, atraumatic, no cyanosis or edema  Pulses:   2+ and symmetric all extremities  Skin:   Skin color, texture, turgor normal, no rashes or lesions  Lymph nodes:   Cervical, supraclavicular, and axillary nodes normal  Neurologic:   Stooped, shuffling gait          Assessment & Plan:

## 2019-02-23 ENCOUNTER — Other Ambulatory Visit: Payer: Self-pay | Admitting: Family Medicine

## 2019-02-25 ENCOUNTER — Other Ambulatory Visit: Payer: Self-pay

## 2019-02-25 ENCOUNTER — Encounter: Payer: Self-pay | Admitting: Podiatry

## 2019-02-25 ENCOUNTER — Ambulatory Visit: Payer: Medicare HMO | Admitting: Podiatry

## 2019-02-25 VITALS — BP 127/65 | HR 57 | Temp 98.1°F | Resp 16

## 2019-02-25 DIAGNOSIS — M79674 Pain in right toe(s): Secondary | ICD-10-CM

## 2019-02-25 DIAGNOSIS — M79675 Pain in left toe(s): Secondary | ICD-10-CM

## 2019-02-25 DIAGNOSIS — L600 Ingrowing nail: Secondary | ICD-10-CM

## 2019-02-25 DIAGNOSIS — B351 Tinea unguium: Secondary | ICD-10-CM

## 2019-02-26 NOTE — Progress Notes (Signed)
Subjective:   Patient ID: Wanda Wright, female   DOB: 85 y.o.   MRN: [id number]   HPI 85 year old female presents today with her daughter for concerns of very thick, elongated toenails have become ingrown.  Denies any redness or drainage or any swelling to the toenail sites.  Nails are painful with pressure.  No recent treatment.  She typically gets pedicures.  She has no other concerns.   Review of Systems  All other systems reviewed and are negative.  Past Medical History:  Diagnosis Date  . Allergic rhinitis   . Bradycardia    January, 2013  . CAD (coronary artery disease)    a. s/p CABG in 2004 b. cath in 2009 showing patent LIMA-LAD, patent SVG-RI, patent RCA, occluded LM  . Carotid artery disease ([place])    Doppler, October, 2011, 0-39% bilateral  . Colon polyps   . Dementia ([place])   . Diabetes mellitus without complication ([place])   . Hearing loss   . Hemidiaphragm    elevated hemidiaphragm  . Hyperlipidemia   . Hypertension   . Osteoarthritis cervical spine     Past Surgical History:  Procedure Laterality Date  . BREAST LUMPECTOMY     multiple, all benign  . CATARACT EXTRACTION Bilateral   . CHOLECYSTECTOMY    . COLONOSCOPY W/ POLYPECTOMY    . CORONARY ARTERY BYPASS GRAFT     ACS  . HIATAL HERNIA REPAIR    . lid lift Bilateral   . TOTAL ABDOMINAL HYSTERECTOMY       Current Outpatient Medications:  .  acetaminophen (TYLENOL) 500 MG tablet, Take 1,000 mg by mouth 2 (two) times daily. , Disp: , Rfl:  .  aspirin 81 MG tablet, Take 81 mg by mouth daily.  , Disp: , Rfl:  .  atorvastatin (LIPITOR) 20 MG tablet, TAKE 1 TABLET BY MOUTH EVERY DAY AS DIRECTED, Disp: 90 tablet, Rfl: 0 .  Calcium Carbonate-Vitamin D 600-400 MG-UNIT per tablet, Take 2 tablets by mouth daily., Disp: , Rfl:  .  chlorthalidone (HYGROTON) 25 MG tablet, Take 0.5 tablets (12.5 mg total) by mouth daily., Disp: 90 tablet, Rfl: 0 .  cholecalciferol (VITAMIN D) 1000 UNITS tablet, Take 1,000 Units by  mouth daily., Disp: , Rfl:  .  docusate sodium (COLACE) 100 MG capsule, TAKE 1 CAPSULE BY MOUTH EVERY 12 HOURS, Disp: 60 capsule, Rfl: 4 .  donepezil (ARICEPT) 10 MG tablet, TAKE 1 TABLET (10 MG TOTAL) BY MOUTH AT BEDTIME., Disp: 90 tablet, Rfl: 1 .  Inulin (FIBER CHOICE) 1.5 g CHEW, Chew 1 each by mouth daily., Disp: , Rfl:  .  loratadine (CLARITIN) 10 MG tablet, Take 10 mg by mouth daily., Disp: , Rfl:  .  nitroGLYCERIN (NITROSTAT) 0.4 MG SL tablet, Place 1 tablet (0.4 mg total) under the tongue every 5 (five) minutes as needed for chest pain., Disp: 25 tablet, Rfl: 11 .  polyethylene glycol (MIRALAX / GLYCOLAX) packet, Take 17 g by mouth at bedtime. , Disp: , Rfl:  .  potassium chloride (K-DUR) 10 MEQ tablet, TAKE 1 TABLET BY MOUTH TWICE A DAY, Disp: 180 tablet, Rfl: 1  Allergies  Allergen Reactions  . Codeine Other (See Comments)    Elevated Heart rate  . Horse-Derived Products Other (See Comments)    Unknown reaction  . Tetanus Toxoid Other (See Comments)    Unknown reaction          Objective:  Physical Exam  General: NAD  Dermatological: Nails are hypertrophic,  dystrophic, brittle, discolored, elongated 10. No surrounding redness or drainage.  Incurvation present to the lateral and medial nail borders of bilateral hallux.  Tenderness nails 1-5 bilaterally. No open lesions or pre-ulcerative lesions are identified today.  Vascular: Dorsalis Pedis artery and Posterior Tibial artery pedal pulses are palpable bilateral with immedate capillary fill time.  There is no pain with calf compression, swelling, warmth, erythema.   Neruologic: Grossly intact via light touch bilateral. l.   Musculoskeletal: No gross boney pedal deformities bilateral. No pain, crepitus, or limitation noted with foot and ankle range of motion bilateral. Muscular strength 5/5 in all groups tested bilateral.  Assessment:   Symptomatic onychomycosis, ingrown toenails    Plan:  -Treatment options discussed  including all alternatives, risks, and complications -Etiology of symptoms were discussed -Nails debrided 10 without complications or bleeding. -Daily foot inspection -Follow-up in 3 months or sooner if any problems arise. In the meantime, encouraged to call the office with any questions, concerns, change in symptoms.   Celesta Gentile, DPM

## 2019-03-12 ENCOUNTER — Ambulatory Visit: Payer: Medicare HMO

## 2019-03-22 ENCOUNTER — Other Ambulatory Visit: Payer: Self-pay | Admitting: Family Medicine

## 2019-03-23 ENCOUNTER — Other Ambulatory Visit: Payer: Self-pay | Admitting: Family Medicine

## 2019-04-28 ENCOUNTER — Other Ambulatory Visit: Payer: Self-pay | Admitting: Family Medicine

## 2019-05-26 ENCOUNTER — Telehealth: Payer: Self-pay | Admitting: Family Medicine

## 2019-05-26 NOTE — Telephone Encounter (Signed)
This requires a visit

## 2019-05-26 NOTE — Telephone Encounter (Signed)
Can we call daughter and schedule? Can bin in office if screening is passed or VV

## 2019-05-26 NOTE — Telephone Encounter (Signed)
This should be an OV right? Even virtual.

## 2019-05-26 NOTE — Telephone Encounter (Signed)
Patient has been scheduled for 05/28/19 at 2:30pm for a in office visit.

## 2019-05-26 NOTE — Telephone Encounter (Signed)
Patients daughter Modesto Charon is calling in stating that she would like a call back to talk about her mother overall health. Patient was getting taken care of by her other daughter but she now is living with [facility] and she states he mother is not doing well. Patient daughter just wants to know what medication she taking and when she needs to take them. Please advise she would like a call back at [phone]

## 2019-05-28 ENCOUNTER — Ambulatory Visit: Payer: Medicare HMO | Admitting: Family Medicine

## 2019-05-30 ENCOUNTER — Ambulatory Visit (INDEPENDENT_AMBULATORY_CARE_PROVIDER_SITE_OTHER): Payer: Medicare HMO | Admitting: Family Medicine

## 2019-05-30 ENCOUNTER — Encounter: Payer: Self-pay | Admitting: Family Medicine

## 2019-05-30 ENCOUNTER — Other Ambulatory Visit: Payer: Self-pay

## 2019-05-30 VITALS — BP 126/78 | HR 65 | Temp 97.9°F | Resp 16 | Ht 64.0 in | Wt 140.5 lb

## 2019-05-30 DIAGNOSIS — F0390 Unspecified dementia without behavioral disturbance: Secondary | ICD-10-CM

## 2019-05-30 DIAGNOSIS — Z23 Encounter for immunization: Secondary | ICD-10-CM

## 2019-05-30 DIAGNOSIS — I10 Essential (primary) hypertension: Secondary | ICD-10-CM | POA: Diagnosis not present

## 2019-05-30 DIAGNOSIS — E7800 Pure hypercholesterolemia, unspecified: Secondary | ICD-10-CM | POA: Diagnosis not present

## 2019-05-30 DIAGNOSIS — I779 Disorder of arteries and arterioles, unspecified: Secondary | ICD-10-CM

## 2019-05-30 MED ORDER — LORATADINE 10 MG PO TABS: 10.0000 mg | ORAL_TABLET | Freq: Every day | ORAL | 3 refills | Status: AC
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
## 2019-05-30 NOTE — Patient Instructions (Signed)
We will contact Hospice and Social Work so they can get in touch with you regarding next steps If she is not able to take the medication or doesn't want to, that's ok At this point, we are not adding to any quality of life w/ medications Call with any questions or concerns Hang in there!
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
## 2019-05-30 NOTE — Assessment & Plan Note (Signed)
There is no need to continue monitoring this at this time given her advanced dementia.

## 2019-07-01 ENCOUNTER — Other Ambulatory Visit: Payer: Self-pay | Admitting: Family Medicine

## 2019-07-01 NOTE — Telephone Encounter (Signed)
Are we keeping pt on this medication?

## 2019-07-24 ENCOUNTER — Telehealth: Payer: Self-pay

## 2019-07-24 NOTE — Telephone Encounter (Signed)
(  [phone] Patient's daughter is calling requesting to speak to Dr. Birdie Riddle or her nurse to discuss the diagnoses of dementia and when it was initially diagnosed.

## 2019-07-24 NOTE — Telephone Encounter (Signed)
Any discussion of pt's health history or current dx would need to be a virtual visit and pt needs to be present in the room

## 2019-07-24 NOTE — Telephone Encounter (Signed)
I tried calling and got no answer. Could we try calling again to make an appt?

## 2019-07-24 NOTE — Telephone Encounter (Signed)
Should this be an appt?

## 2019-07-25 ENCOUNTER — Other Ambulatory Visit: Payer: Self-pay | Admitting: Family Medicine

## 2019-07-25 ENCOUNTER — Encounter: Payer: Self-pay | Admitting: Family Medicine

## 2019-07-25 NOTE — Telephone Encounter (Signed)
I cannot 'certify' a diagnosis of dementia as this is a clinical diagnosis and a diagnosis of exclusion, but she has been on Aricept since 2012 indicating that she has had cognitive impairment/decline at that time

## 2019-07-25 NOTE — Telephone Encounter (Signed)
I am not sure what to do with this

## 2019-07-25 NOTE — Telephone Encounter (Signed)
Neoma Laming pt's daughter states that she needs a signed statement stating the date when her mother was first diagnosed with dementia. Since deborah is at a rehab they are trying to included her mother's income as hers. Deborah's name is on her mother's checking account. Please call Neoma Laming at [phone]

## 2019-07-25 NOTE — Telephone Encounter (Signed)
Called and advised pt daughter that the letter was at the front desk for pick up.

## 2019-07-25 NOTE — Telephone Encounter (Signed)
Called and spoke with pt daughter Neoma Laming. She is trying to apply for Medicaid/Medicare. Since her name is on her mom's checking account they are stating she does not qualify. She is asking if you could be willing to write a letter stating something to the effect that pt was unable to make sound judgements and she was taking care of her.   Also she was wanting to know if Toy Cookey, with [facility] rehab facility could pick up the letter from our office for her?

## 2019-07-25 NOTE — Telephone Encounter (Signed)
Letter written and is available for pick up by the designated person

## 2019-09-08 DEATH — deceased

## 2019-10-02 ENCOUNTER — Telehealth: Payer: Self-pay | Admitting: Family Medicine

## 2019-10-02 NOTE — Telephone Encounter (Signed)
Please extend my sympathies.  As for her sister getting disability, I am not sister's doctor so I am unable to help.

## 2019-10-02 NOTE — Telephone Encounter (Signed)
FYI

## 2019-10-02 NOTE — Telephone Encounter (Signed)
Patsy called stating that her mom passed away on 2019-09-09. She wanted to make Dr. Birdie Riddle aware of this.   She also wanted to know how she could talk to about helping her sister get disability. She can be reached at [phone]

## 2019-10-02 NOTE — Telephone Encounter (Signed)
Tried calling. Phone advised call could not go through.

## 2019-10-07 NOTE — Telephone Encounter (Signed)
Tried calling number again. It gave the same message that call could not be completed.

## 2021-02-02 ENCOUNTER — Encounter: Payer: Self-pay | Admitting: *Deleted
